# Patient Record
Sex: Female | Born: 1990 | Race: White | Hispanic: No | Marital: Married | Smoking: Never smoker
Health system: Southern US, Community
[De-identification: ages and names within clinical notes are randomized; demographics above are authoritative.]

## PROBLEM LIST (undated history)

## (undated) DIAGNOSIS — N390 Urinary tract infection, site not specified: Secondary | ICD-10-CM

## (undated) DIAGNOSIS — T7840XA Allergy, unspecified, initial encounter: Secondary | ICD-10-CM

## (undated) DIAGNOSIS — J45909 Unspecified asthma, uncomplicated: Secondary | ICD-10-CM

## (undated) DIAGNOSIS — F329 Major depressive disorder, single episode, unspecified: Secondary | ICD-10-CM

## (undated) DIAGNOSIS — K219 Gastro-esophageal reflux disease without esophagitis: Secondary | ICD-10-CM

## (undated) DIAGNOSIS — F419 Anxiety disorder, unspecified: Secondary | ICD-10-CM

## (undated) DIAGNOSIS — F32A Depression, unspecified: Secondary | ICD-10-CM

## (undated) HISTORY — DX: Allergy, unspecified, initial encounter: T78.40XA

## (undated) HISTORY — DX: Urinary tract infection, site not specified: N39.0

## (undated) HISTORY — PX: TONSILLECTOMY: SUR1361

## (undated) HISTORY — DX: Gastro-esophageal reflux disease without esophagitis: K21.9

---
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
## 2011-01-07 ENCOUNTER — Emergency Department: Payer: Self-pay | Admitting: Emergency Medicine

## 2011-07-12 ENCOUNTER — Emergency Department: Payer: Self-pay | Admitting: Emergency Medicine

## 2011-10-29 ENCOUNTER — Emergency Department: Payer: Self-pay | Admitting: Emergency Medicine

## 2011-10-29 LAB — CBC
HCT: 43.8 % (ref 35.0–47.0)
HGB: 15.1 g/dL (ref 12.0–16.0)
MCH: 29.6 pg (ref 26.0–34.0)
MCHC: 34.4 g/dL (ref 32.0–36.0)
MCV: 86 fL (ref 80–100)
Platelet: 186 10*3/uL (ref 150–440)
RBC: 5.09 10*6/uL (ref 3.80–5.20)

## 2011-10-29 LAB — COMPREHENSIVE METABOLIC PANEL
Anion Gap: 10 (ref 7–16)
BUN: 12 mg/dL (ref 7–18)
Bilirubin,Total: 0.3 mg/dL (ref 0.2–1.0)
Creatinine: 0.61 mg/dL (ref 0.60–1.30)
EGFR (African American): >60
EGFR (Non-African Amer.): >60
Glucose: 98 mg/dL (ref 65–99)
Osmolality: 285 (ref 275–301)
Potassium: 3.5 mmol/L (ref 3.5–5.1)
SGOT(AST): 22 U/L (ref 0–26)
SGPT (ALT): 19 U/L (ref 12–78)
Sodium: 143 mmol/L (ref 136–145)
Total Protein: 7.6 g/dL (ref 6.4–8.6)

## 2011-10-29 LAB — URINALYSIS, COMPLETE
Bacteria: NONE SEEN
Bilirubin,UR: NEGATIVE
Glucose,UR: NEGATIVE mg/dL (ref 0–75)
Leukocyte Esterase: NEGATIVE
RBC,UR: <1 /HPF (ref 0–5)
Squamous Epithelial: 5
WBC UR: 2 /HPF (ref 0–5)

## 2011-10-29 LAB — LIPASE, BLOOD: Lipase: 114 U/L (ref 73–393)

## 2013-08-16 ENCOUNTER — Encounter: Payer: Self-pay | Admitting: Nurse Practitioner

## 2013-08-28 ENCOUNTER — Encounter: Payer: Self-pay | Admitting: Nurse Practitioner

## 2013-11-22 LAB — OB RESULTS CONSOLE GC/CHLAMYDIA
Chlamydia: NEGATIVE
Gonorrhea: NEGATIVE

## 2013-12-14 LAB — OB RESULTS CONSOLE HEPATITIS B SURFACE ANTIGEN: HEP B S AG: NEGATIVE

## 2013-12-14 LAB — OB RESULTS CONSOLE RPR: RPR: NONREACTIVE

## 2013-12-14 LAB — OB RESULTS CONSOLE VARICELLA ZOSTER ANTIBODY, IGG: Varicella: NON-IMMUNE/NOT IMMUNE

## 2013-12-14 LAB — OB RESULTS CONSOLE ANTIBODY SCREEN: ANTIBODY SCREEN: NEGATIVE

## 2013-12-14 LAB — OB RESULTS CONSOLE ABO/RH: RH TYPE: POSITIVE

## 2013-12-14 LAB — OB RESULTS CONSOLE HIV ANTIBODY (ROUTINE TESTING): HIV: NONREACTIVE

## 2013-12-14 LAB — OB RESULTS CONSOLE RUBELLA ANTIBODY, IGM: Rubella: IMMUNE

## 2014-01-28 NOTE — L&D Delivery Note (Signed)
Deliver Note   Date of Delivery:   07/10/2014 Primary OB:   [facility] Gestational Age/EDD: [redacted]w[redacted]d by 07/23/2014, by Last Menstrual Period  Antepartum complications:  OB History    Gravida Para Term Preterm AB TAB SAB Ectopic Multiple Living   1               Delivered By:   Malachy Mood MD  Delivery Type:   NSVD  Anesthesia:    Epidural  Intrapartum complications:  GBS:    Negative (06/03 0000) Laceration:     None Episiotomy:    none Placenta:    Spontaneous Estimated Blood Loss:   545mL Baby:    Liveborn female , APGAR (1 MIN): 8  APGAR (5 MINS): 9  APGAR (10 MINS):  , weight pending   Deliver Details   At  a female was delivered via  (Presentation:ROA   ).  APGAR:8 ,9 ; weight pending  Placenta status: delivered spontaneously and intact.  3 Vessel  Cord:  Some mild atony immediately following delivery 875mcg of cytotec placed rectally   Mom to postpartum.  Baby to Couplet care / Skin to Skin.

## 2014-07-01 LAB — OB RESULTS CONSOLE GBS: GBS: NEGATIVE

## 2014-07-01 LAB — OB RESULTS CONSOLE GC/CHLAMYDIA
CHLAMYDIA, DNA PROBE: NEGATIVE
Gonorrhea: NEGATIVE

## 2014-07-10 ENCOUNTER — Inpatient Hospital Stay
Admission: EM | Admit: 2014-07-10 | Discharge: 2014-07-12 | DRG: 775 | Disposition: A | Discharge status: Home / Self Care | Payer: BLUE CROSS/BLUE SHIELD | Attending: Obstetrics and Gynecology | Admitting: Obstetrics and Gynecology

## 2014-07-10 ENCOUNTER — Encounter: Payer: Self-pay | Admitting: *Deleted

## 2014-07-10 ENCOUNTER — Inpatient Hospital Stay: Payer: BLUE CROSS/BLUE SHIELD | Admitting: Anesthesiology

## 2014-07-10 DIAGNOSIS — F329 Major depressive disorder, single episode, unspecified: Secondary | ICD-10-CM | POA: Diagnosis present

## 2014-07-10 DIAGNOSIS — J45909 Unspecified asthma, uncomplicated: Secondary | ICD-10-CM | POA: Diagnosis present

## 2014-07-10 DIAGNOSIS — Z3A38 38 weeks gestation of pregnancy: Secondary | ICD-10-CM | POA: Diagnosis present

## 2014-07-10 DIAGNOSIS — Z79899 Other long term (current) drug therapy: Secondary | ICD-10-CM

## 2014-07-10 DIAGNOSIS — Z3490 Encounter for supervision of normal pregnancy, unspecified, unspecified trimester: Secondary | ICD-10-CM

## 2014-07-10 DIAGNOSIS — Z9889 Other specified postprocedural states: Secondary | ICD-10-CM

## 2014-07-10 HISTORY — DX: Depression, unspecified: F32.A

## 2014-07-10 HISTORY — DX: Unspecified asthma, uncomplicated: J45.909

## 2014-07-10 HISTORY — DX: Major depressive disorder, single episode, unspecified: F32.9

## 2014-07-10 LAB — CBC
HCT: 37.3 % (ref 35.0–47.0)
Hemoglobin: 12.5 g/dL (ref 12.0–16.0)
MCH: 30.1 pg (ref 26.0–34.0)
MCHC: 33.6 g/dL (ref 32.0–36.0)
MCV: 89.6 fL (ref 80.0–100.0)
Platelets: 150 10*3/uL (ref 150–440)
RBC: 4.17 MIL/uL (ref 3.80–5.20)
RDW: 12.9 % (ref 11.5–14.5)
WBC: 11 10*3/uL (ref 3.6–11.0)

## 2014-07-10 MED ORDER — SENNOSIDES-DOCUSATE SODIUM 8.6-50 MG PO TABS: 2.0000 | ORAL_TABLET | ORAL | Status: DC

## 2014-07-10 MED ORDER — OXYCODONE-ACETAMINOPHEN 5-325 MG PO TABS: 2.0000 | ORAL_TABLET | ORAL | Status: DC | PRN

## 2014-07-10 MED ORDER — AMMONIA AROMATIC IN INHA
RESPIRATORY_TRACT | Status: AC
  Filled 2014-07-10: qty 10

## 2014-07-10 MED ORDER — FENTANYL 2.5 MCG/ML W/ROPIVACAINE 0.2% IN NS 100 ML EPIDURAL INFUSION (ARMC-ANES)
EPIDURAL | Status: AC
  Administered 2014-07-10: 9 mL/h via EPIDURAL
  Filled 2014-07-10: qty 100

## 2014-07-10 MED ORDER — OXYTOCIN BOLUS FROM INFUSION
500.0000 mL | INTRAVENOUS | Status: DC
  Administered 2014-07-10: 500 mL via INTRAVENOUS

## 2014-07-10 MED ORDER — ONDANSETRON HCL 4 MG/2ML IJ SOLN: 4.0000 mg | INTRAMUSCULAR | Status: DC | PRN

## 2014-07-10 MED ORDER — BUTORPHANOL TARTRATE 1 MG/ML IJ SOLN
INTRAMUSCULAR | Status: AC
  Administered 2014-07-10: 1 mg via INTRAVENOUS
  Filled 2014-07-10: qty 1

## 2014-07-10 MED ORDER — BUTORPHANOL TARTRATE 1 MG/ML IJ SOLN
1.0000 mg | Freq: Once | INTRAMUSCULAR | Status: AC
  Administered 2014-07-10: 1 mg via INTRAVENOUS

## 2014-07-10 MED ORDER — OXYTOCIN 40 UNITS IN LACTATED RINGERS INFUSION - SIMPLE MED: 62.5000 mL/h | INTRAVENOUS | Status: DC

## 2014-07-10 MED ORDER — SERTRALINE HCL 100 MG PO TABS
100.0000 mg | ORAL_TABLET | Freq: Every day | ORAL | Status: DC
  Administered 2014-07-11 – 2014-07-12 (×2): 100 mg via ORAL
  Filled 2014-07-10 (×2): qty 1

## 2014-07-10 MED ORDER — PHENYLEPHRINE 40 MCG/ML (10ML) SYRINGE FOR IV PUSH (FOR BLOOD PRESSURE SUPPORT)
80.0000 ug | PREFILLED_SYRINGE | INTRAVENOUS | Status: DC | PRN
  Filled 2014-07-10: qty 2

## 2014-07-10 MED ORDER — CITRIC ACID-SODIUM CITRATE 334-500 MG/5ML PO SOLN: 30.0000 mL | ORAL | Status: DC | PRN

## 2014-07-10 MED ORDER — IBUPROFEN 600 MG PO TABS: 600.0000 mg | ORAL_TABLET | Freq: Four times a day (QID) | ORAL | Status: DC

## 2014-07-10 MED ORDER — BUTORPHANOL TARTRATE 1 MG/ML IJ SOLN
2.0000 mg | INTRAMUSCULAR | Status: DC | PRN
  Administered 2014-07-10: 2 mg via INTRAVENOUS

## 2014-07-10 MED ORDER — MISOPROSTOL 200 MCG PO TABS
ORAL_TABLET | ORAL | Status: AC
  Administered 2014-07-10: 20:00:00
  Filled 2014-07-10: qty 4

## 2014-07-10 MED ORDER — BENZOCAINE-MENTHOL 20-0.5 % EX AERO: 1.0000 "application " | INHALATION_SPRAY | CUTANEOUS | Status: DC | PRN

## 2014-07-10 MED ORDER — LACTATED RINGERS IV SOLN: INTRAVENOUS | Status: DC

## 2014-07-10 MED ORDER — EPHEDRINE 5 MG/ML INJ
10.0000 mg | INTRAVENOUS | Status: DC | PRN
  Filled 2014-07-10: qty 2

## 2014-07-10 MED ORDER — OXYTOCIN 40 UNITS IN LACTATED RINGERS INFUSION - SIMPLE MED
INTRAVENOUS | Status: AC
  Filled 2014-07-10: qty 1000

## 2014-07-10 MED ORDER — ACETAMINOPHEN 325 MG PO TABS: 650.0000 mg | ORAL_TABLET | ORAL | Status: DC | PRN

## 2014-07-10 MED ORDER — LANOLIN HYDROUS EX OINT: TOPICAL_OINTMENT | CUTANEOUS | Status: DC | PRN

## 2014-07-10 MED ORDER — SIMETHICONE 80 MG PO CHEW: 80.0000 mg | CHEWABLE_TABLET | ORAL | Status: DC | PRN

## 2014-07-10 MED ORDER — FENTANYL 2.5 MCG/ML W/ROPIVACAINE 0.2% IN NS 100 ML EPIDURAL INFUSION (ARMC-ANES)
9.0000 mL/h | EPIDURAL | Status: DC
  Administered 2014-07-10: 9 mL/h via EPIDURAL

## 2014-07-10 MED ORDER — DIBUCAINE 1 % RE OINT: 1.0000 "application " | TOPICAL_OINTMENT | RECTAL | Status: DC | PRN

## 2014-07-10 MED ORDER — WITCH HAZEL-GLYCERIN EX PADS: 1.0000 "application " | MEDICATED_PAD | CUTANEOUS | Status: DC | PRN

## 2014-07-10 MED ORDER — PRENATAL MULTIVITAMIN CH
1.0000 | ORAL_TABLET | Freq: Every day | ORAL | Status: DC
  Administered 2014-07-12: 1 via ORAL
  Filled 2014-07-10: qty 1

## 2014-07-10 MED ORDER — LACTATED RINGERS IV SOLN
500.0000 mL | INTRAVENOUS | Status: DC | PRN
  Administered 2014-07-10: 500 mL via INTRAVENOUS

## 2014-07-10 MED ORDER — ONDANSETRON HCL 4 MG PO TABS: 4.0000 mg | ORAL_TABLET | ORAL | Status: DC | PRN

## 2014-07-10 MED ORDER — DIPHENHYDRAMINE HCL 25 MG PO CAPS: 25.0000 mg | ORAL_CAPSULE | Freq: Four times a day (QID) | ORAL | Status: DC | PRN

## 2014-07-10 MED ORDER — LIDOCAINE HCL (PF) 1 % IJ SOLN
30.0000 mL | INTRAMUSCULAR | Status: DC | PRN
  Filled 2014-07-10: qty 30

## 2014-07-10 MED ORDER — DIPHENHYDRAMINE HCL 50 MG/ML IJ SOLN: 12.5000 mg | INTRAMUSCULAR | Status: DC | PRN

## 2014-07-10 MED ORDER — BUTORPHANOL TARTRATE 1 MG/ML IJ SOLN
INTRAMUSCULAR | Status: AC
  Administered 2014-07-10: 2 mg via INTRAVENOUS
  Filled 2014-07-10: qty 2

## 2014-07-10 MED ORDER — OXYCODONE-ACETAMINOPHEN 5-325 MG PO TABS: 1.0000 | ORAL_TABLET | ORAL | Status: DC | PRN

## 2014-07-10 MED ORDER — OXYTOCIN 10 UNIT/ML IJ SOLN
INTRAMUSCULAR | Status: AC
  Filled 2014-07-10: qty 2

## 2014-07-10 MED ORDER — LIDOCAINE HCL (PF) 1 % IJ SOLN
INTRAMUSCULAR | Status: AC
  Filled 2014-07-10: qty 30

## 2014-07-10 MED ORDER — ONDANSETRON HCL 4 MG/2ML IJ SOLN: 4.0000 mg | Freq: Four times a day (QID) | INTRAMUSCULAR | Status: DC | PRN

## 2014-07-10 NOTE — Anesthesia Procedure Notes (Signed)
Epidural Patient location during procedure: OB  Staffing Anesthesiologist: Gunnar Bulla Performed by: anesthesiologist   Preanesthetic Checklist Completed: patient identified, site marked, surgical consent, pre-op evaluation, timeout performed, IV checked, risks and benefits discussed and monitors and equipment checked  Epidural Patient position: sitting Prep: Betadine Patient monitoring: heart rate, continuous pulse ox and blood pressure Approach: midline Location: L4-L5 Injection technique: LOR saline  Needle:  Needle type: Tuohy  Needle gauge: 18 G Needle length: 9 cm and 9 Catheter type: closed end flexible Catheter size: 20 Guage Test dose: negative and 1.5% lidocaine with Epi 1:200 K  Assessment Sensory level: T10 Events: blood not aspirated, injection not painful, no injection resistance, negative IV test and no paresthesia  Additional Notes   Patient tolerated the insertion well without complications. Catheter placed 1431.  Test dose 1433. 60ml 0.25 marcaine at 1437. Infusion at 1442.Reason for block:procedure for pain

## 2014-07-10 NOTE — Plan of Care (Signed)
Dr. Georgianne Fick at bedside. Discussing plan of care. MD discussing that at this time he cannot do anything to help labor speed up due to [facility] and that he would prefer for pt to receive another dose of stadol when needed and not get epidural until there is change in the cervix or not. UC's have at this time spaced from earlier.  Lenore Cordia RNC

## 2014-07-10 NOTE — Discharge Summary (Signed)
VSS, pericare complete, ice applied to perineum, gown changed.  Pt in wheelchair ready to transfer to M/B 338 accompanied by FOB "Gerald Stabs" and infant

## 2014-07-10 NOTE — OB Triage Note (Signed)
Contractions starting at 0130 AM, woke pt. up. No leaking fluid or vaginal bleeding.

## 2014-07-10 NOTE — H&P (Signed)
Obstetric H&P   Chief Complaint: Contractions  Prenatal Care Provider: [facility]  History of Present Illness: 24 y.o. G1P0 [redacted]w[redacted]d by 07/23/2014, by Last Menstrual Period presenting to L&D with contractions starting at 01:30 this AM and awakening from sleep.  +FM, no LOF, no VB  ABO, Rh: A/Positive/-- (11/17 0000)  Antibody: Negative (11/17 0000)  Rubella :Immune Varicella: Non-Immune RPR: Nonreactive (11/17 0000)  HBsAg: Negative (11/17 0000)  HIV: Non-reactive (11/17 0000)  RPR: Nonreactive (11/17 0000) 1-hr: 115 GBS: Negative (06/03 0000)  T-dap: received 4/15  Review of Systems: 10 point review of systems negative unless otherwise noted in HPI  Past Medical History: Past Medical History  Diagnosis Date  . Asthma   . Depression     Past Surgical History: Past Surgical History  Procedure Laterality Date  . Tonsillectomy    . Wisdom tooth extraction Bilateral     Past Obstetric History: G1  Past Gynecologic History: Pap normal 07/23/13  Family History: History reviewed. No pertinent family history.  Social History: History   Social History  . Marital Status: Single    Spouse Name: N/A  . Number of Children: N/A  . Years of Education: N/A   Occupational History  . Not on file.   Social History Main Topics  . Smoking status: Never Smoker   . Smokeless tobacco: Never Used  . Alcohol Use: No  . Drug Use: No  . Sexual Activity: Yes   Other Topics Concern  . Not on file   Social History Narrative  . No narrative on file    Medications: Prior to Admission medications   Medication Sig Start Date End Date Taking? Authorizing Provider  Prenatal Vit-Fe Fumarate-FA (PRENATAL MULTIVITAMIN) TABS tablet Take 1 tablet by mouth daily at 12 noon.   Yes Historical Provider, MD  sertraline (ZOLOFT) 100 MG tablet Take 100 mg by mouth daily.   Yes Historical Provider, MD    Allergies: No Known Allergies  Physical Exam: Vitals: Blood pressure 133/71, pulse 64,  temperature 97.7 F (36.5 C), temperature source Oral, resp. rate 16, last menstrual period 10/16/2013.  Urine Dip Protein: N/A  FHT: 139, moderate, positive accels, no decels reactive NST/category I tracing (actually negative spontaneous contractions stress test) Toco: q2-63min  General: Painfully contracting HEENT: normocephalic anicteric Pulmonary: no increased work of breathing Cardiovascular: RRR Abdomen: Gravid,  Non-tender Leopolds: vtx 6lbs Genitourinary: Change from 1cm on Friday to 2cm this AM now tight 3/C/0 station Extremities: no edema  Labs: No results found for this or any previous visit (from the past 24 hour(s)).  Assessment: 24 y.o. G1P0 [redacted]w[redacted]d by 07/23/2014, presenting for r/o labor  Plan: 1) R/O labor - no cervical change over first 2-hrs, contractions increased to q35min and 1cm of cervical change  2) Fetus - negative contraction stress test - weight gain 17lbs this pregnancy - 1-hr 115  3) PNL - A/Positive/-- (11/17 0000) / Negative (11/17 0000) / Rubella Immune / Varicella Non-Immune  / RPR Nonreactive (11/17 0000) / HBsAg  Negative (11/17 0000) / HIV  Nonreactive (11/17 0000) / 1-hr OGTT 115 / GBS Negative (06/03 0000)  4) TDAP - 4/15  5) Disposition - admit for term labor

## 2014-07-10 NOTE — Discharge Summary (Signed)
Obstetric Discharge Summary Reason for Admission: onset of labor Prenatal Procedures: NST Intrapartum Procedures: spontaneous vaginal delivery Postpartum Procedures: varicella vaccine  Complications-Operative and Postpartum: none HEMOGLOBIN  Date Value Ref Range Status  07/11/2014 10.4* 12.0 - 16.0 g/dL Final   HGB  Date Value Ref Range Status  10/29/2011 15.1 12.0-16.0 g/dL Final   HCT  Date Value Ref Range Status  07/11/2014 30.8* 35.0 - 47.0 % Final  10/29/2011 43.8 35.0-47.0 % Final    Physical Exam:  General: alert, cooperative, appears stated age and no distress Lochia: appropriate Uterine Fundus: firm Incision: NA DVT Evaluation: No evidence of DVT seen on physical exam.  Discharge Diagnoses: Term Pregnancy-delivered  Discharge Information: Date: 07/12/2014 Activity: unrestricted Diet: routine Medications: PNV Condition: stable Instructions: refer to practice specific booklet Discharge to: home   Newborn Data: Live born female  Birth Weight:   APGAR: 8,9   Home with mother.  Jeanette Miranda 07/12/2014, 10:40 AM

## 2014-07-10 NOTE — Anesthesia Preprocedure Evaluation (Signed)
Anesthesia Evaluation  Patient identified by MRN, date of birth, ID band  Reviewed: Allergy & Precautions, NPO status , Patient's Chart, lab work & pertinent test results  Airway Mallampati: II  TM Distance: >3 FB     Dental  (+) Chipped   Pulmonary asthma ,          Cardiovascular     Neuro/Psych PSYCHIATRIC DISORDERS Depression    GI/Hepatic   Endo/Other    Renal/GU      Musculoskeletal   Abdominal   Peds  Hematology   Anesthesia Other Findings   Reproductive/Obstetrics                             Anesthesia Physical Anesthesia Plan  ASA: II  Anesthesia Plan: Epidural   Post-op Pain Management:    Induction:   Airway Management Planned: Nasal Cannula  Additional Equipment:   Intra-op Plan:   Post-operative Plan:   Informed Consent: I have reviewed the patients History and Physical, chart, labs and discussed the procedure including the risks, benefits and alternatives for the proposed anesthesia with the patient or authorized representative who has indicated his/her understanding and acceptance.     Plan Discussed with:   Anesthesia Plan Comments:         Anesthesia Quick Evaluation

## 2014-07-10 NOTE — Progress Notes (Signed)
Subjective:  Painfully contracting  Objective:   Vitals: Blood pressure 119/67, pulse 74, temperature 98.1 F (36.7 C), temperature source Oral, resp. rate 16, height 5\' 1"  (1.549 m), weight 53.071 kg (117 lb), last menstrual period 10/16/2013. General:  Abdomen: Cervical Exam:  Dilation: 4 Effacement (%): 100 Cervical Position: Posterior Station: 0 Presentation: Vertex Exam by:: Dr. Georgianne Fick  FHT:  120, moderate, positive accels, no decels Toco: q64min  Results for orders placed or performed during the hospital encounter of 07/10/14 (from the past 24 hour(s))  CBC     Status: None   Collection Time: 07/10/14  8:48 AM  Result Value Ref Range   WBC 11.0 3.6 - 11.0 K/uL   RBC 4.17 3.80 - 5.20 MIL/uL   Hemoglobin 12.5 12.0 - 16.0 g/dL   HCT 37.3 35.0 - 47.0 %   MCV 89.6 80.0 - 100.0 fL   MCH 30.1 26.0 - 34.0 pg   MCHC 33.6 32.0 - 36.0 g/dL   RDW 12.9 11.5 - 14.5 %   Platelets 150 150 - 440 K/uL    Assessment:   24 y.o. G1P0 [redacted]w[redacted]d term labor  Plan:   1) Labor - continues to make change - ok for epidural  2) Fetus - cat I tracing

## 2014-07-11 LAB — CBC
HCT: 30.8 % — ABNORMAL LOW (ref 35.0–47.0)
HEMOGLOBIN: 10.4 g/dL — AB (ref 12.0–16.0)
MCH: 30.3 pg (ref 26.0–34.0)
MCHC: 33.9 g/dL (ref 32.0–36.0)
MCV: 89.5 fL (ref 80.0–100.0)
Platelets: 134 10*3/uL — ABNORMAL LOW (ref 150–440)
RBC: 3.44 MIL/uL — AB (ref 3.80–5.20)
RDW: 12.8 % (ref 11.5–14.5)
WBC: 15.8 10*3/uL — ABNORMAL HIGH (ref 3.6–11.0)

## 2014-07-11 LAB — RPR: RPR Ser Ql: NONREACTIVE

## 2014-07-11 MED ORDER — VARICELLA VIRUS VACCINE LIVE 1350 PFU/0.5ML IJ SUSR
0.5000 mL | INTRAMUSCULAR | Status: AC | PRN
  Administered 2014-07-12: 0.5 mL via SUBCUTANEOUS
  Filled 2014-07-11: qty 0.5

## 2014-07-11 NOTE — Progress Notes (Addendum)
PPD #1 NSVD  S: Feels well. Tolerating regular diet. Breast feeding O: BP 106/70 mmHg  Pulse 87  Temp(Src) 98.7 F (37.1 C) (Oral)  Resp 20  Ht 5\' 1"  (1.549 m)  Wt 53.071 kg (117 lb)  BMI 22.12 kg/m2  SpO2 100%  LMP 10/16/2013  Breastfeeding? Unknown-is breastfeeding FF at U, deviated to left of ML/NT (needs to void) Lochia: WNL OOB to void without difficulty No calf tenderness Hct=30% A: Stable PPD#1 Probable DC in Am Needs Varicella vaccine prior to discharge A POS/RI/ TDAP 4/15  Dalia Heading, CNM

## 2014-07-11 NOTE — Anesthesia Postprocedure Evaluation (Cosign Needed)
  Anesthesia Post-op Note  Patient: Jeanette Miranda  Procedure(s) Performed: epidural    Anesthesia type:No value filed.   Patient location:338a  Post pain: Pain level controlled  Post assessment: Post-op Vital signs reviewed, Patient's Cardiovascular Status Stable, Respiratory Function Stable, Patent Airway and No signs of Nausea or vomiting  Post vital signs: Reviewed and stable  Last Vitals:  Filed Vitals:   07/11/14 0446  BP: 95/64  Pulse: 76  Temp: 37.2 C  Resp: 17    Level of consciousness: awake, alert  and patient cooperative  Complications: No apparent anesthesia complications

## 2014-07-11 NOTE — Anesthesia Post-op Follow-up Note (Signed)
  Anesthesia Pain Follow-up Note  Patient: Jeanette Miranda  Day #: 1  Date of Follow-up: 07/11/2014 Time: 7:47 AM  Last Vitals:  Filed Vitals:   07/11/14 0446  BP: 95/64  Pulse: 76  Temp: 37.2 C  Resp: 17    Level of Consciousness: alert  Pain: none   Side Effects:None  Catheter Site Exam:clean, dry, no drainage  Plan: D/C from anesthesia care  Delaney Meigs

## 2014-07-12 NOTE — Progress Notes (Signed)
D/C order from MD.  Reviewed d/c instructions and prescriptions with patient and answered any questions.  Patient d/c home with infant via wheelchair by nursing/auxillary. 

## 2014-07-12 NOTE — Discharge Instructions (Signed)
Please call your doctor or return to the ER if you experience any chest pains, shortness of breath, fever greater than 101, any heavy bleeding or large clots, and foul smelling vaginal discharge, any worsening abdominal pain & cramping that is not controlled by pain medication, or any signs of post partum depression.  No tampons, enemas, douches, or sexual intercourse for 6 weeks.  Also avoid tub baths, hot tubs, or swimming for 6 weeks.  Postpartum Care After Vaginal Delivery After you deliver your newborn (postpartum period), the usual stay in the hospital is 24-72 hours. If there were problems with your labor or delivery, or if you have other medical problems, you might be in the hospital longer.  While you are in the hospital, you will receive help and instructions on how to care for yourself and your newborn during the postpartum period.  While you are in the hospital:  Be sure to tell your nurses if you have pain or discomfort, as well as where you feel the pain and what makes the pain worse.  If you had an incision made near your vagina (episiotomy) or if you had some tearing during delivery, the nurses may put ice packs on your episiotomy or tear. The ice packs may help to reduce the pain and swelling.  If you are breastfeeding, you may feel uncomfortable contractions of your uterus for a couple of weeks. This is normal. The contractions help your uterus get back to normal size.  It is normal to have some bleeding after delivery.  For the first 1-3 days after delivery, the flow is red and the amount may be similar to a period.  It is common for the flow to start and stop.  In the first few days, you may pass some small clots. Let your nurses know if you begin to pass large clots or your flow increases.  Do not  flush blood clots down the toilet before having the nurse look at them.  During the next 3-10 days after delivery, your flow should become more watery and pink or brown-tinged  in color.  Ten to fourteen days after delivery, your flow should be a small amount of yellowish-white discharge.  The amount of your flow will decrease over the first few weeks after delivery. Your flow may stop in 6-8 weeks. Most women have had their flow stop by 12 weeks after delivery.  You should change your sanitary pads frequently.  Wash your hands thoroughly with soap and water for at least 20 seconds after changing pads, using the toilet, or before holding or feeding your newborn.  You should feel like you need to empty your bladder within the first 6-8 hours after delivery.  In case you become weak, lightheaded, or faint, call your nurse before you get out of bed for the first time and before you take a shower for the first time.  Within the first few days after delivery, your breasts may begin to feel tender and full. This is called engorgement. Breast tenderness usually goes away within 48-72 hours after engorgement occurs. You may also notice milk leaking from your breasts. If you are not breastfeeding, do not stimulate your breasts. Breast stimulation can make your breasts produce more milk.  Spending as much time as possible with your newborn is very important. During this time, you and your newborn can feel close and get to know each other. Having your newborn stay in your room (rooming in) will help to strengthen the bond  with your newborn. It will give you time to get to know your newborn and become comfortable caring for your newborn.  Your hormones change after delivery. Sometimes the hormone changes can temporarily cause you to feel sad or tearful. These feelings should not last more than a few days. If these feelings last longer than that, you should talk to your caregiver.  If desired, talk to your caregiver about methods of family planning or contraception.  Talk to your caregiver about immunizations. Your caregiver may want you to have the following immunizations before  leaving the hospital:  Tetanus, diphtheria, and pertussis (Tdap) or tetanus and diphtheria (Td) immunization. It is very important that you and your family (including grandparents) or others caring for your newborn are up-to-date with the Tdap or Td immunizations. The Tdap or Td immunization can help protect your newborn from getting ill.  Rubella immunization.  Varicella (chickenpox) immunization.  Influenza immunization. You should receive this annual immunization if you did not receive the immunization during your pregnancy. Document Released: 11/11/2006 Document Revised: 10/09/2011 Document Reviewed: 09/11/2011 [facility] Patient Information 2015 [place], [place]. This information is not intended to replace advice given to you by your health care provider. Make sure you discuss any questions you have with your health care provider.

## 2015-05-03 ENCOUNTER — Encounter: Payer: Self-pay | Admitting: Gynecology

## 2015-05-03 ENCOUNTER — Ambulatory Visit
Admission: EM | Admit: 2015-05-03 | Discharge: 2015-05-03 | Disposition: A | Discharge status: Home / Self Care | Payer: Medicaid Other | Attending: Family Medicine | Admitting: Family Medicine

## 2015-05-03 DIAGNOSIS — R21 Rash and other nonspecific skin eruption: Secondary | ICD-10-CM | POA: Diagnosis not present

## 2015-05-03 MED ORDER — LORATADINE 10 MG PO TABS: 10.0000 mg | ORAL_TABLET | Freq: Every day | ORAL | Status: DC

## 2015-05-03 MED ORDER — PREDNISONE 20 MG PO TABS: 40.0000 mg | ORAL_TABLET | Freq: Every day | ORAL | Status: DC

## 2015-05-03 MED ORDER — RANITIDINE HCL 150 MG PO TABS
150.0000 mg | ORAL_TABLET | Freq: Two times a day (BID) | ORAL | Status: DC
Start: 2015-05-03 — End: 2016-04-27

## 2015-05-03 MED ORDER — RANITIDINE HCL 150 MG PO TABS: 150.0000 mg | ORAL_TABLET | Freq: Two times a day (BID) | ORAL | Status: DC

## 2015-05-03 MED ORDER — LORATADINE 10 MG PO TABS
10.0000 mg | ORAL_TABLET | Freq: Every day | ORAL | Status: DC
Start: 2015-05-03 — End: 2016-04-27

## 2015-05-03 NOTE — ED Provider Notes (Signed)
[facility]  ____________________________________________  Time seen: Approximately 6:37 PM  I have reviewed the triage vital signs and the nursing notes.   HISTORY  Chief Complaint Rash   HPI Jeanette Miranda is a 25 y.o. female presents for complaint of rash to right side of neck and right face. Patient reports that she was fine last night before she went to bed. Patient states that during the night she returned waking up to scratch her right side of her neck. Patient states initially she thought that some of her hair was tickling her neck causing it to itch. Patient states that when she got up the right side of her neck had a rash that was itching and states that the rash then spread to her right side of her face this morning. Denies any other rash. Reports has not taken anything for rash.   Denies recent changes in medications, lotions, detergents or other known contacts. Denies others in household with same. Denies history of similar. Denies swelling. Denies any lip, tongue, mouth or facial swelling. Denies any difficulty eating or swallowing. Denies shortness of breath or wheezing. Reports has continued to eat and drink well throughout the day.  Denies any insect bites, warmth to the skin, drainage. Denies tick bite or tick exposure.  Denies chest pain, shortness breath, dizziness, weakness, neck pain, back pain or swelling.  Patient reports that she currently is occasionally breast-feeding. Patient states that she has shared custody of her 38 month old and intermittently breast-feeds.  No LMP recorded. Patient is not currently having periods (Reason: IUD). Denies chance of pregnancy. States not pregnant.    PCP Princella Ion     Past Medical History  Diagnosis Date  . Asthma   . Depression     Patient Active Problem List   Diagnosis Date Noted  . Postpartum care following vaginal delivery 07/12/2014  . Pregnancy 07/10/2014  . Labor and delivery, indication for  care 07/10/2014    Past Surgical History  Procedure Laterality Date  . Tonsillectomy    . Wisdom tooth extraction Bilateral     Current Outpatient Rx  Name  Route  Sig  Dispense  Refill  . Prenatal Vit-Fe Fumarate-FA (PRENATAL MULTIVITAMIN) TABS tablet   Oral   Take 1 tablet by mouth daily.          . sertraline (ZOLOFT) 100 MG tablet   Oral   Take 100 mg by mouth daily.           Allergies Review of patient's allergies indicates no known allergies.  No family history on file.  Social History Social History  Substance Use Topics  . Smoking status: Never Smoker   . Smokeless tobacco: Never Used  . Alcohol Use: No    Review of Systems Constitutional: No fever/chills Eyes: No visual changes. ENT: No sore throat. Cardiovascular: Denies chest pain. Respiratory: Denies shortness of breath. Gastrointestinal: No abdominal pain.  No nausea, no vomiting.  No diarrhea.  No constipation. Genitourinary: Negative for dysuria. Musculoskeletal: Negative for back pain. Skin: positive for rash. Neurological: Negative for headaches, focal weakness or numbness.  10-point ROS otherwise negative.  ____________________________________________   PHYSICAL EXAM:  VITAL SIGNS: ED Triage Vitals  Enc Vitals Group     BP 05/03/15 1738 110/65 mmHg     Pulse Rate 05/03/15 1738 72     Resp 05/03/15 1738 16     Temp 05/03/15 1738 98.4 F (36.9 C)     Temp Source 05/03/15 1738  Oral     SpO2 05/03/15 1738 100 %     Weight 05/03/15 1738 98 lb (44.453 kg)     Height 05/03/15 1738 5\' 1"  (1.549 m)     Head Cir --      Peak Flow --      Pain Score 05/03/15 1743 0     Pain Loc --      Pain Edu? --      Excl. in [place]? --     Constitutional: Alert and oriented. Well appearing and in no acute distress. Eyes: Conjunctivae are normal. PERRL. EOMI. Head: Atraumatic.  Ears: no erythema, normal TMs bilaterally.   Nose: No congestion/rhinnorhea.  Mouth/Throat: Mucous membranes are moist.   Oropharynx non-erythematous. Neck: No stridor.  No cervical spine tenderness to palpation.Nontender. No swelling. Hematological/Lymphatic/Immunilogical: No cervical lymphadenopathy. Cardiovascular: Normal rate, regular rhythm. Grossly normal heart sounds.  Good peripheral circulation. Respiratory: Normal respiratory effort.  No retractions. Lungs CTAB.No wheezes, rales or rhonchi. Good air movement. Gastrointestinal: Soft and nontender.  No CVA tenderness. Musculoskeletal: No lower or upper extremity tenderness nor edema.  No joint effusions. Bilateral pedal pulses equal and easily palpated.  Neurologic:  Normal speech and language. No gross focal neurologic deficits are appreciated. No gait instability. Skin:  Skin is warm, dry and intact. No rash noted. Except: Right lateral anterior neck and right lower face with pruritic scattered maculopapular rash present, patient actively scratching area,  no other erythema, no induration, no fluctuance, no drainage, nontender. No other rash noted.  Psychiatric: Mood and affect are normal. Speech and behavior are normal.  ____________________________________________   LABS (all labs ordered are listed, but only abnormal results are displayed)  Labs Reviewed - No data to display   INITIAL IMPRESSION / ASSESSMENT AND PLAN / ED COURSE  Pertinent labs & imaging results that were available during my care of the patient were reviewed by me and considered in my medical decision making (see chart for details).  very well-appearing patient. No acute distress. Presents with complaints of rash to right neck and right face that occurred overnight after laying on her pillow. Denies other complaints. Denies known trigger. Well appearing. Lungs clear throughout. Abdomen soft and nontender. Rash pattern and clinical presentation suspicious for contact dermatitis. Encouraged patient to monitor for triggers. Will treat with oral prednisone 5 days, Claritin and Zantac.  Encouraged to avoid scratching. Discussed in detail with patient as patient is intermittently breast-feeding recommend to not breast-feed while taking these medications, recommended to pump and discard milk. Patient states that she has no problem in doing this and states that that is because she does not breast-feed solely at this time. Recommend follow-up with primary care physician this week.  Discussed follow up with Primary care physician this week. Discussed follow up to urgent care or ER and return parameters including  increased rash, facial swelling, shortness breath, difficulty swallowing, wheezing, no resolution or any worsening concerns. Patient verbalized understanding and agreed to plan.   ____________________________________________   FINAL CLINICAL IMPRESSION(S) / ED DIAGNOSES  Final diagnoses:  Rash      Note: This dictation was prepared with Dragon dictation along with smaller phrase technology. Any transcriptional errors that result from this process are unintentional.    Marylene Land, NP 05/03/15 2149  Marylene Land, NP 05/03/15 2150

## 2015-05-03 NOTE — Discharge Instructions (Signed)
Take medication as prescribed. Rest. Drink plenty of fluids. Do not breastfeed while taking medications. Pump and discard milk as discussed.   Follow up with your primary care physician this week. Return to Urgent care or proceed to ER for increased rash, swelling, mouth or oral swelling, difficulty swallowing, increased redness, new or worsening concerns.

## 2015-05-03 NOTE — ED Notes (Signed)
Patient c/o itchy rash on neck and face x yesterday.

## 2015-06-27 ENCOUNTER — Encounter: Payer: Self-pay | Admitting: Gynecology

## 2015-06-27 ENCOUNTER — Ambulatory Visit: Payer: Medicaid Other

## 2015-06-27 ENCOUNTER — Ambulatory Visit
Admission: EM | Admit: 2015-06-27 | Discharge: 2015-06-27 | Disposition: A | Discharge status: Home / Self Care | Payer: Medicaid Other | Attending: Family Medicine | Admitting: Family Medicine

## 2015-06-27 DIAGNOSIS — M79645 Pain in left finger(s): Secondary | ICD-10-CM | POA: Diagnosis present

## 2015-06-27 DIAGNOSIS — J45909 Unspecified asthma, uncomplicated: Secondary | ICD-10-CM | POA: Insufficient documentation

## 2015-06-27 DIAGNOSIS — M65312 Trigger thumb, left thumb: Secondary | ICD-10-CM | POA: Diagnosis not present

## 2015-06-27 DIAGNOSIS — F329 Major depressive disorder, single episode, unspecified: Secondary | ICD-10-CM | POA: Diagnosis not present

## 2015-06-27 MED ORDER — MELOXICAM 15 MG PO TABS: 15.0000 mg | ORAL_TABLET | Freq: Every day | ORAL | Status: DC

## 2015-06-27 NOTE — ED Provider Notes (Signed)
CSN: [id number]     Arrival date & time 06/27/15  1559 History   First MD Initiated Contact with Patient 06/27/15 [place]    Nurses notes were reviewed. Chief Complaint  Patient presents with  . Finger Injury   Patient injured her left thumb at work about a week ago when she left thumb with a hammer. She still didn't report to work people at the time she says she is not planning to [employer]. 4. Since then has been painful lately which has been the thumb she feels that it is clicking or catching. No previous injuries to the thumb. She has a history of asthma and depression she does not smoke. Set tonsillectomy teeth extraction. She is gravida 1 para 1 and no pertinent family medical history pertaining to this visit.      (Consider location/radiation/quality/duration/timing/severity/associated sxs/prior Treatment) Patient is a 25 y.o. female presenting with extremity pain. The history is provided by the patient. No language interpreter was used.  Extremity Pain This is a new problem. The current episode started more than 1 week ago. The problem occurs constantly. The problem has been gradually worsening. Pertinent negatives include no chest pain, no abdominal pain, no headaches and no shortness of breath. Exacerbated by: Movement of the left thumb. She has tried nothing for the symptoms.    Past Medical History  Diagnosis Date  . Asthma   . Depression    Past Surgical History  Procedure Laterality Date  . Tonsillectomy    . Wisdom tooth extraction Bilateral    No family history on file. Social History  Substance Use Topics  . Smoking status: Never Smoker   . Smokeless tobacco: Never Used  . Alcohol Use: No   OB History    Gravida Para Term Preterm AB TAB SAB Ectopic Multiple Living   1 1 1       0 1     Review of Systems  Respiratory: Negative for shortness of breath.   Cardiovascular: Negative for chest pain.  Gastrointestinal: Negative for abdominal pain.    Musculoskeletal: Positive for myalgias.       L thumb  Neurological: Negative for headaches.  All other systems reviewed and are negative.   Allergies  Review of patient's allergies indicates no known allergies.  Home Medications   Prior to Admission medications   Medication Sig Start Date End Date Taking? Authorizing Provider  levonorgestrel (MIRENA) 20 MCG/24HR IUD 1 each by Intrauterine route once.   Yes Historical Provider, MD  sertraline (ZOLOFT) 100 MG tablet Take 100 mg by mouth daily.   Yes Historical Provider, MD  loratadine (CLARITIN) 10 MG tablet Take 1 tablet (10 mg total) by mouth daily. 05/03/15   Marylene Land, NP  meloxicam (MOBIC) 15 MG tablet Take 1 tablet (15 mg total) by mouth daily. 06/27/15   Frederich Cha, MD  predniSONE (DELTASONE) 20 MG tablet Take 2 tablets (40 mg total) by mouth daily. 05/03/15   Marylene Land, NP  Prenatal Vit-Fe Fumarate-FA (PRENATAL MULTIVITAMIN) TABS tablet Take 1 tablet by mouth daily.     Historical Provider, MD  ranitidine (ZANTAC) 150 MG tablet Take 1 tablet (150 mg total) by mouth 2 (two) times daily. 05/03/15 05/08/15  Marylene Land, NP   Meds Ordered and Administered this Visit  Medications - No data to display  BP 106/60 mmHg  Pulse 66  Temp(Src) 98.2 F (36.8 C) (Oral)  Resp 16  Ht 5\' 1"  (1.549 m)  Wt 101 lb (45.813 kg)  BMI 19.09 kg/m2  SpO2 100% No data found.   Physical Exam  Constitutional: She is oriented to person, place, and time. She appears well-developed and well-nourished. No distress.  HENT:  Head: Normocephalic and atraumatic.  Eyes: Pupils are equal, round, and reactive to light.  Neck: Normal range of motion.  Pulmonary/Chest: Effort normal.  Musculoskeletal: She exhibits tenderness.       Left hand: She exhibits decreased range of motion and tenderness. She exhibits no deformity.       Hands: Tenderness over the left thumb especially over the DIP joint  Neurological: She is alert and oriented to person,  place, and time.  Skin: Skin is warm.  Psychiatric: She has a normal mood and affect.  Vitals reviewed.   ED Course  Procedures (including critical care time)  Labs Review Labs Reviewed - No data to display  Imaging Review Dg Finger Thumb Left  06/27/2015  CLINICAL DATA:  Hit thumb with a hammer 1 week ago with pain EXAM: LEFT THUMB 2+V COMPARISON:  None. FINDINGS: No acute fracture is seen. Alignment is normal. Joint spaces appear normal. IMPRESSION: Negative. Electronically Signed   By: Ivar Drape M.D.   On: 06/27/2015 17:11     Visual Acuity Review  Right Eye Distance:   Left Eye Distance:   Bilateral Distance:    Right Eye Near:   Left Eye Near:    Bilateral Near:         MDM   1. Trigger thumb, left    Split the left thumb recommend orthopedic hand surgeon referral to see in 1-2 weeks especially if not improving. We'll place on Mobic 15 mg 1 tablet day I will see if this splinting and Mobic helps she can cancel the orthopedic referral. Reviewing of work today and tomorrow.   Note: This dictation was prepared with Dragon dictation along with smaller phrase technology. Any transcriptional errors that result from this process are unintentional.       Frederich Cha, MD 06/27/15 [phone]

## 2015-06-27 NOTE — ED Notes (Signed)
Patient c/o x 1 week ago hit left thumb with a hammer. Patient stated x 3-4 days left thumb is painful with motion.

## 2015-06-27 NOTE — Discharge Instructions (Signed)
Trigger Finger °Trigger finger (digital tendinitis and stenosing tenosynovitis) is a common disorder that causes an often painful catching of the fingers or thumb. It occurs as a clicking, snapping, or locking of a finger in the palm of the hand. This is caused by a problem with the tendons that flex or bend the fingers sliding smoothly through their sheaths. The condition may occur in any finger or a couple fingers at the same time.  °The finger may lock with the finger curled or suddenly straighten out with a snap. This is more common in patients with rheumatoid arthritis and diabetes. Left untreated, the condition may get worse to the point where the finger becomes locked in flexion, like making a fist, or less commonly locked with the finger straightened out. °CAUSES  °· Inflammation and scarring that lead to swelling around the tendon sheath. °· Repeated or forceful movements. °· Rheumatoid arthritis, an autoimmune disease that affects joints. °· Gout. °· Diabetes mellitus. °SIGNS AND SYMPTOMS °· Soreness and swelling of your finger. °· A painful clicking or snapping as you bend and straighten your finger. °DIAGNOSIS  °Your health care provider will do a physical exam of your finger to diagnose trigger finger. °TREATMENT  °· Splinting for 6-8 weeks may be helpful. °· Nonsteroidal anti-inflammatory medicines (NSAIDs) can help to relieve the pain and inflammation. °· Cortisone injections, along with splinting, may speed up recovery. Several injections may be required. Cortisone may give relief after one injection. °· Surgery is another treatment that may be used if conservative treatments do not work. Surgery can be minor, without incisions (a cut does not have to be made), and can be done with a needle through the skin. °· Other surgical choices involve an open procedure in which the surgeon opens the hand through a small incision and cuts the pulley so the tendon can again slide smoothly. Your hand will still  work fine. °HOME CARE INSTRUCTIONS °· Apply ice to the injured area, twice per day: °¨ Put ice in a plastic bag. °¨ Place a towel between your skin and the bag. °¨ Leave the ice on for 20 minutes, 3-4 times a day. °· Rest your hand often. °MAKE SURE YOU:  °· Understand these instructions. °· Will watch your condition. °· Will get help right away if you are not doing well or get worse. °  °This information is not intended to replace advice given to you by your health care provider. Make sure you discuss any questions you have with your health care provider. °  °Document Released: 11/04/2003 Document Revised: 09/16/2012 Document Reviewed: 06/16/2012 °Elsevier Interactive Patient Education ©2016 [employer] ° °

## 2015-09-01 ENCOUNTER — Emergency Department
Admission: EM | Admit: 2015-09-01 | Discharge: 2015-09-01 | Disposition: A | Discharge status: Home / Self Care | Payer: Medicaid Other | Attending: Emergency Medicine | Admitting: Emergency Medicine

## 2015-09-01 DIAGNOSIS — Z79899 Other long term (current) drug therapy: Secondary | ICD-10-CM | POA: Insufficient documentation

## 2015-09-01 DIAGNOSIS — Z760 Encounter for issue of repeat prescription: Secondary | ICD-10-CM | POA: Insufficient documentation

## 2015-09-01 DIAGNOSIS — J45909 Unspecified asthma, uncomplicated: Secondary | ICD-10-CM | POA: Diagnosis not present

## 2015-09-01 MED ORDER — SERTRALINE HCL 100 MG PO TABS
100.0000 mg | ORAL_TABLET | Freq: Every day | ORAL | 0 refills | Status: DC
Start: 2015-09-01 — End: 2016-04-27

## 2015-09-01 NOTE — ED Provider Notes (Signed)
[facility] Emergency Department Provider Note   ____________________________________________   First MD Initiated Contact with Patient 09/01/15 1242     (approximate)  I have reviewed the triage vital signs and the nursing notes.   HISTORY  Chief Complaint Medication Refill    HPI Jeanette Miranda is a 25 y.o. female is here for refill of her Zoloft 100 mg. Patient states she took her last dose today and is completely out. She states that Dr. Shelda Pal is out of the office and no one in the same office will authorize a refill until that the doctor returns from vacation. Patient states that she only needs enough until her doctor returns to the office on Wednesday. She denies any problems at this time. She denies any pain.   Past Medical History:  Diagnosis Date  . Asthma   . Depression     Patient Active Problem List   Diagnosis Date Noted  . Postpartum care following vaginal delivery 07/12/2014  . Pregnancy 07/10/2014  . Labor and delivery, indication for care 07/10/2014    Past Surgical History:  Procedure Laterality Date  . TONSILLECTOMY    . WISDOM TOOTH EXTRACTION Bilateral     Prior to Admission medications   Medication Sig Start Date End Date Taking? Authorizing Provider  levonorgestrel (MIRENA) 20 MCG/24HR IUD 1 each by Intrauterine route once.    Historical Provider, MD  loratadine (CLARITIN) 10 MG tablet Take 1 tablet (10 mg total) by mouth daily. 05/03/15   Marylene Land, NP  meloxicam (MOBIC) 15 MG tablet Take 1 tablet (15 mg total) by mouth daily. 06/27/15   Frederich Cha, MD  predniSONE (DELTASONE) 20 MG tablet Take 2 tablets (40 mg total) by mouth daily. 05/03/15   Marylene Land, NP  Prenatal Vit-Fe Fumarate-FA (PRENATAL MULTIVITAMIN) TABS tablet Take 1 tablet by mouth daily.     Historical Provider, MD  ranitidine (ZANTAC) 150 MG tablet Take 1 tablet (150 mg total) by mouth 2 (two) times daily. 05/03/15 05/08/15  Marylene Land, NP    sertraline (ZOLOFT) 100 MG tablet Take 1 tablet (100 mg total) by mouth daily. 09/01/15 08/31/16  Johnn Hai, PA-C    Allergies Review of patient's allergies indicates no known allergies.  No family history on file.  Social History Social History  Substance Use Topics  . Smoking status: Never Smoker  . Smokeless tobacco: Never Used  . Alcohol use No    Review of Systems Constitutional: No fever/chills Eyes: No visual changes. Cardiovascular: Denies chest pain. Respiratory: Denies shortness of breath. Gastrointestinal: No abdominal pain.  No nausea, no vomiting.   Skin: Negative for rash. Neurological: Negative for headaches  10-point ROS otherwise negative.  ____________________________________________   PHYSICAL EXAM:  VITAL SIGNS: ED Triage Vitals 09/01/15 1233  Enc Vitals Group     BP 114/66     Pulse Rate 81     Resp 18     Temp 98 F (36.7 C)     Temp Source Oral     SpO2 100 %     Weight 100 lb (45.4 kg)     Height      Head Circumference      Peak Flow      Pain Score      Pain Loc      Pain Edu?      Excl. in [place]?     Constitutional: Alert and oriented. Well appearing and in no acute distress. Eyes: Conjunctivae are normal.  PERRL. EOMI. Head: Atraumatic. Nose: No congestion/rhinnorhea. Neck: No stridor.   Cardiovascular: Normal rate, regular rhythm. Grossly normal heart sounds.  Good peripheral circulation. Respiratory: Normal respiratory effort.  No retractions. Lungs CTAB. Musculoskeletal: Moves upper and lower extremities without any difficulty and normal gait was noted. Neurologic:  Normal speech and language. No gross focal neurologic deficits are appreciated. No gait instability. Skin:  Skin is warm, dry and intact.  Psychiatric: Mood and affect are normal. Speech and behavior are normal.  ____________________________________________   LABS (all labs ordered are listed, but only abnormal results are displayed)  Labs Reviewed - No  data to display  PROCEDURES  Procedure(s) performed: None  Procedures  Critical Care performed: No  ____________________________________________   INITIAL IMPRESSION / ASSESSMENT AND PLAN / ED COURSE  Pertinent labs & imaging results that were available during my care of the patient were reviewed by me and considered in my medical decision making (see chart for details).    Clinical Course   Patient is given a prescription for Zoloft 500 mg one daily #6. This will cover her until her PCP is back in the office. There was documented evidence that patient is on this medication and was prescribed by her PCP and June 2016.  ____________________________________________   FINAL CLINICAL IMPRESSION(S) / ED DIAGNOSES  Final diagnoses:  Medication refill      NEW MEDICATIONS STARTED DURING THIS VISIT:  Discharge Medication List as of 09/01/2015  1:11 PM       Note:  This document was prepared using Dragon voice recognition software and may include unintentional dictation errors.    Johnn Hai, PA-C 09/01/15 [place], MD 09/01/15 [phone]

## 2015-09-01 NOTE — ED Triage Notes (Signed)
Pt states she needs a refill for her Zoloft 100mg , states she took her last dose today.. States Dr. Shelda Pal normally fills it but she and the pharmacy having attempted to contact the office.Marland Kitchen

## 2015-09-01 NOTE — ED Notes (Signed)
Patient comes in for a refill for sertraline.  Patient attempted to call PCP in regards to refill but the MD is out of the office until Wednesday and she has no refills on her bottle Rx.

## 2016-04-27 ENCOUNTER — Encounter: Payer: Self-pay | Admitting: Medical Oncology

## 2016-04-27 ENCOUNTER — Emergency Department
Admission: EM | Admit: 2016-04-27 | Discharge: 2016-04-27 | Disposition: A | Discharge status: Home / Self Care | Payer: Medicaid Other | Attending: Emergency Medicine | Admitting: Emergency Medicine

## 2016-04-27 DIAGNOSIS — L299 Pruritus, unspecified: Secondary | ICD-10-CM | POA: Diagnosis present

## 2016-04-27 DIAGNOSIS — J45909 Unspecified asthma, uncomplicated: Secondary | ICD-10-CM | POA: Diagnosis not present

## 2016-04-27 MED ORDER — HYDROXYZINE HCL 25 MG PO TABS: 25.0000 mg | ORAL_TABLET | Freq: Three times a day (TID) | ORAL | 0 refills | Status: DC | PRN

## 2016-04-27 MED ORDER — TRIAMCINOLONE ACETONIDE 0.5 % EX OINT: 1.0000 "application " | TOPICAL_OINTMENT | Freq: Two times a day (BID) | CUTANEOUS | 0 refills | Status: DC

## 2016-04-27 MED ORDER — HYDROXYZINE HCL 25 MG PO TABS
25.0000 mg | ORAL_TABLET | Freq: Once | ORAL | Status: AC
  Administered 2016-04-27: 25 mg via ORAL
  Filled 2016-04-27: qty 1

## 2016-04-27 NOTE — Discharge Instructions (Signed)
Follow-up with your primary care doctor. Call and make an appointment for next week. Atarax as needed for itching. Kenalog cream twice a day to hands and feet. Use a small layer and rubbed cream into the skin. Also trim nails to avoid scratching

## 2016-04-27 NOTE — ED Triage Notes (Signed)
Pt reports that she began having rash to both hands that is itchy and burning yesterday. Pt denies other areas involved and pt is in no resp distress.

## 2016-04-27 NOTE — ED Notes (Signed)
See triage note  Developed a itchy rash to both hands yesterday

## 2016-04-27 NOTE — ED Provider Notes (Signed)
[facility] Emergency Department Provider Note  ____________________________________________   First MD Initiated Contact with Patient 04/27/16 1426     (approximate)  I have reviewed the triage vital signs and the nursing notes.   HISTORY  Chief Complaint No chief complaint on file.    HPI Jeanette Miranda is a 26 y.o. female is here complaining of itching and rash to both hands and feet beginning yesterday. Patient states that it is a itchy, burning sensation. She states that she went to an urgent care today and was told that they did not know what her rash was and follow-up with her PCP. Once she got home she felt the rash was spreading more on her feet and came to the emergency room. She states that she is tried Neosporin and Aloe gel to these areas without improvement.  Patient denies any difficulty with breathing. She denies any previous rashes such as this.   Past Medical History:  Diagnosis Date  . Asthma   . Depression     Patient Active Problem List   Diagnosis Date Noted  . Postpartum care following vaginal delivery 07/12/2014  . Pregnancy 07/10/2014  . Labor and delivery, indication for care 07/10/2014    Past Surgical History:  Procedure Laterality Date  . TONSILLECTOMY    . WISDOM TOOTH EXTRACTION Bilateral     Prior to Admission medications   Medication Sig Start Date End Date Taking? Authorizing Provider  hydrOXYzine (ATARAX/VISTARIL) 25 MG tablet Take 1 tablet (25 mg total) by mouth 3 (three) times daily as needed. 04/27/16   Johnn Hai, PA-C  levonorgestrel (MIRENA) 20 MCG/24HR IUD 1 each by Intrauterine route once.    Historical Provider, MD  triamcinolone ointment (KENALOG) 0.5 % Apply 1 application topically 2 (two) times daily. 04/27/16   Johnn Hai, PA-C    Allergies Patient has no known allergies.  No family history on file.  Social History Social History  Substance Use Topics  . Smoking status: Never  Smoker  . Smokeless tobacco: Never Used  . Alcohol use No    Review of Systems Constitutional: No fever/chills Cardiovascular: Denies chest pain. Respiratory: Denies shortness of breath. Gastrointestinal:   No nausea, no vomiting.  Genitourinary: Negative for dysuria. Musculoskeletal: Negative for  joint pain. Skin: Positive for rash palms and feet.. Neurological: Negative for headaches, focal weakness or numbness.  10-point ROS otherwise negative.  ____________________________________________   PHYSICAL EXAM:  VITAL SIGNS: ED Triage Vitals [04/27/16 1406]  Enc Vitals Group     BP 110/76     Pulse Rate 71     Resp 18     Temp 98.5 F (36.9 C)     Temp Source Oral     SpO2 100 %     Weight 114 lb (51.7 kg)     Height 5\' 1"  (1.549 m)     Head Circumference      Peak Flow      Pain Score 7     Pain Loc      Pain Edu?      Excl. in [place]?     Constitutional: Alert and oriented. Well appearing and in no acute distress. Eyes: Conjunctivae are normal. PERRL. EOMI. Head: Atraumatic. Nose: No congestion/rhinnorhea. Neck: No stridor.   Cardiovascular: Normal rate, regular rhythm. Grossly normal heart sounds.  Good peripheral circulation. Respiratory: Normal respiratory effort.  No retractions. Lungs CTAB. Musculoskeletal: No lower extremity tenderness nor edema.  No joint effusions. Normal gait  was noted. Neurologic:  Normal speech and language. No gross focal neurologic deficits are appreciated.  Skin:  Skin is warm, dry and intact. There are fine papules/vesicle appearing lesions diffusely over the palms of the hands with minimal involvement bilateral soles. No erythema is noted. Skin is intact. There is no soft tissue swelling present. Psychiatric: Mood and affect are normal. Speech and behavior are normal.  ____________________________________________   LABS (all labs ordered are listed, but only abnormal results are displayed)  Labs Reviewed - No data to  display   PROCEDURES  Procedure(s) performed: None  Procedures  Critical Care performed: No  ____________________________________________   INITIAL IMPRESSION / ASSESSMENT AND PLAN / ED COURSE  Pertinent labs & imaging results that were available during my care of the patient were reviewed by me and considered in my medical decision making (see chart for details).  Patient is follow-up with her primary care doctor next week. Most likely this is to side or sits. We discussed possibly being referred to a dermatologist should she continue to have problems. In the emergency room today she was given Atarax 25 mg by mouth for itching. She is given a prescription for Kenalog cream to apply to area twice a day.  She is also encouraged to trim her nails to decrease the chances of her breaking the skin should she scratched her skin in her sleep.      ____________________________________________   FINAL CLINICAL IMPRESSION(S) / ED DIAGNOSES  Final diagnoses:  Itching      NEW MEDICATIONS STARTED DURING THIS VISIT:  New Prescriptions   HYDROXYZINE (ATARAX/VISTARIL) 25 MG TABLET    Take 1 tablet (25 mg total) by mouth 3 (three) times daily as needed.   TRIAMCINOLONE OINTMENT (KENALOG) 0.5 %    Apply 1 application topically 2 (two) times daily.     Note:  This document was prepared using Dragon voice recognition software and may include unintentional dictation errors.    Johnn Hai, PA-C 04/27/16 [place], MD 04/27/16 [phone]

## 2016-04-28 ENCOUNTER — Emergency Department
Admission: EM | Admit: 2016-04-28 | Discharge: 2016-04-28 | Disposition: A | Discharge status: Home / Self Care | Payer: Medicaid Other | Attending: Emergency Medicine | Admitting: Emergency Medicine

## 2016-04-28 DIAGNOSIS — B084 Enteroviral vesicular stomatitis with exanthem: Secondary | ICD-10-CM | POA: Diagnosis not present

## 2016-04-28 DIAGNOSIS — Z79899 Other long term (current) drug therapy: Secondary | ICD-10-CM | POA: Diagnosis not present

## 2016-04-28 DIAGNOSIS — R21 Rash and other nonspecific skin eruption: Secondary | ICD-10-CM | POA: Diagnosis present

## 2016-04-28 DIAGNOSIS — J45909 Unspecified asthma, uncomplicated: Secondary | ICD-10-CM | POA: Insufficient documentation

## 2016-04-28 MED ORDER — DIPHENHYDRAMINE HCL 50 MG/ML IJ SOLN
25.0000 mg | Freq: Once | INTRAMUSCULAR | Status: DC
  Filled 2016-04-28: qty 1

## 2016-04-28 NOTE — ED Triage Notes (Signed)
Patient reports seen earlier for allergic reaction.  Reports feels that it is worse.  Patient noted with light red flat rash noted to bilateral arms.

## 2016-04-28 NOTE — ED Notes (Signed)
Patient reports seen earlier for allergic reaction and states instructed to return if worse.  Patient reports rash has spread up her arms.  Noted red rash at antecub area, no obvious rash noted to arms.  Patient speaking in complete sentences without difficulty.  No acute respiratory distress noted.

## 2016-04-28 NOTE — ED Provider Notes (Signed)
[name] Emergency Department Provider Note  ____________________________________________  Time seen: Approximately 9:31 PM  I have reviewed the triage vital signs and the nursing notes.   HISTORY  Chief Complaint Allergic Reaction   HPI Jeanette Miranda is a 26 y.o. female a history of asthma who presents for evaluation of a rash.Patient reports that she has had a mild sore throat for the last few days. Her illness started 4 days ago with a severe sore throat and high fever. 2 days ago she developed a rash involving her palms and soles. The rash is pruritic and has spread for the last 2 days. She has been taking Benadryl. She reports that the rash is painful and the soles of her feet. Her son who is 56 years old has had similar rash at home. She denies having fever for the last 2 days. No headaches, no neck stiffness, no tick bite, no history of STDs, no chest pain, no shortness of breath, no abdominal pain, no nausea, no vomiting, no diarrhea.  Past Medical History:  Diagnosis Date  . Asthma   . Depression     Patient Active Problem List   Diagnosis Date Noted  . Postpartum care following vaginal delivery 07/12/2014  . Pregnancy 07/10/2014  . Labor and delivery, indication for care 07/10/2014    Past Surgical History:  Procedure Laterality Date  . TONSILLECTOMY    . WISDOM TOOTH EXTRACTION Bilateral     Prior to Admission medications   Medication Sig Start Date End Date Taking? Authorizing Provider  hydrOXYzine (ATARAX/VISTARIL) 25 MG tablet Take 1 tablet (25 mg total) by mouth 3 (three) times daily as needed. 04/27/16   Johnn Hai, PA-C  levonorgestrel (MIRENA) 20 MCG/24HR IUD 1 each by Intrauterine route once.    Historical Provider, MD  triamcinolone ointment (KENALOG) 0.5 % Apply 1 application topically 2 (two) times daily. 04/27/16   Johnn Hai, PA-C    Allergies Patient has no known allergies.  No family history on  file.  Social History Social History  Substance Use Topics  . Smoking status: Never Smoker  . Smokeless tobacco: Never Used  . Alcohol use No    Review of Systems  Constitutional: Negative for fever. Eyes: Negative for visual changes. ENT: + sore throat. Neck: No neck pain  Cardiovascular: Negative for chest pain. Respiratory: Negative for shortness of breath. Gastrointestinal: Negative for abdominal pain, vomiting or diarrhea. Genitourinary: Negative for dysuria. Musculoskeletal: Negative for back pain. Skin: + rash. Neurological: Negative for headaches, weakness or numbness. Psych: No SI or HI  ____________________________________________   PHYSICAL EXAM:  VITAL SIGNS: ED Triage Vitals [04/28/16 2113]  Enc Vitals Group     BP 111/64     Pulse Rate 68     Resp 18     Temp 97.6 F (36.4 C)     Temp Source Oral     SpO2 100 %     Weight      Height      Head Circumference      Peak Flow      Pain Score      Pain Loc      Pain Edu?      Excl. in [place]?     Constitutional: Alert and oriented. Well appearing and in no apparent distress. HEENT:      Head: Normocephalic and atraumatic.         Eyes: Conjunctivae are normal. Sclera is non-icteric. EOMI. PERRL  Mouth/Throat: Mucous membranes are moist.       Neck: Supple with no signs of meningismus. Herpangina lesions on the oropharynx Cardiovascular: Regular rate and rhythm. No murmurs, gallops, or rubs. 2+ symmetrical distal pulses are present in all extremities. No JVD. Respiratory: Normal respiratory effort. Lungs are clear to auscultation bilaterally. No wheezes, crackles, or rhonchi.  Gastrointestinal: Soft, non tender, and non distended with positive bowel sounds. No rebound or guarding. Genitourinary: No CVA tenderness. Musculoskeletal: Nontender with normal range of motion in all extremities. No edema, cyanosis, or erythema of extremities. Neurologic: Normal speech and language. Face is symmetric.  Moving all extremities. No gross focal neurologic deficits are appreciated. Skin: Skin is warm, dry and intact. Blanching macular erythematous rash involving the torso, extremities, soles and palms.  Psychiatric: Mood and affect are normal. Speech and behavior are normal.  ____________________________________________   LABS (all labs ordered are listed, but only abnormal results are displayed)  Labs Reviewed - No data to display ____________________________________________  EKG  none  ____________________________________________  RADIOLOGY  none  ____________________________________________   PROCEDURES  Procedure(s) performed: None Procedures Critical Care performed:  None ____________________________________________   INITIAL IMPRESSION / ASSESSMENT AND PLAN / ED COURSE   26 y.o. female a history of asthma who presents for evaluation of sore throat and a rash involving the palms and the soles. Patient is extremely well appearing, no distress, she has a macular blanching erythematous rash involving her torso, extremities, palms and soles. She also has herpangina. No meningeal signs. No history of tick bites. No history of STDs. Presentation is concerning for coxsackie hand-foot-and-mouth disease. Patient will be discharged home with supportive care.     Pertinent labs & imaging results that were available during my care of the patient were reviewed by me and considered in my medical decision making (see chart for details).    ____________________________________________   FINAL CLINICAL IMPRESSION(S) / ED DIAGNOSES  Final diagnoses:  Hand, foot and mouth disease      NEW MEDICATIONS STARTED DURING THIS VISIT:  New Prescriptions   No medications on file     Note:  This document was prepared using Dragon voice recognition software and may include unintentional dictation errors.    Rudene Re, MD 04/28/16 2136

## 2016-07-15 ENCOUNTER — Ambulatory Visit: Payer: Self-pay | Attending: Oncology

## 2016-07-15 ENCOUNTER — Encounter (INDEPENDENT_AMBULATORY_CARE_PROVIDER_SITE_OTHER): Payer: Self-pay

## 2016-07-15 VITALS — BP 98/64 | HR 75 | Temp 97.2°F | Ht 61.0 in | Wt 111.0 lb

## 2016-07-15 DIAGNOSIS — N63 Unspecified lump in unspecified breast: Secondary | ICD-10-CM

## 2016-07-15 NOTE — Progress Notes (Signed)
Subjective:     Patient ID: Jeanette Miranda, female   DOB: Aug 28, 1990, 26 y.o.   MRN: [id number]  HPI   Review of Systems     Objective:   Physical Exam  Pulmonary/Chest: Right breast exhibits mass and tenderness. Right breast exhibits no inverted nipple, no nipple discharge and no skin change. Left breast exhibits no inverted nipple, no mass, no nipple discharge, no skin change and no tenderness. Breasts are symmetrical.         Assessment:     26 year old patient presents for [facility] clinic visit.  Referred for painful right breast lump by Sabino Snipes MD.  Patient reports she has noticed for about one year.  Patient screened, and meets BCCCP eligibility.  Patient does not have insurance, Medicare or Medicaid.  Handout given on Affordable Care Act.  Instructed patient on breast self-exam using teach back method.  Palpated small nodularity 10 o'clock right breast which patient identifies as painful on palpation.  States she has noticed about 1 1/2 months.  No discrete mass palpated.    Plan:    [name] to schedule right breast ultrasound with [facility], and notify patient of appointment.

## 2016-07-22 ENCOUNTER — Other Ambulatory Visit: Payer: Self-pay

## 2016-07-25 ENCOUNTER — Ambulatory Visit
Admission: RE | Admit: 2016-07-25 | Discharge: 2016-07-25 | Disposition: A | Discharge status: Home / Self Care | Payer: Medicaid Other | Source: Ambulatory Visit | Attending: Oncology | Admitting: Oncology

## 2016-07-25 DIAGNOSIS — N63 Unspecified lump in unspecified breast: Secondary | ICD-10-CM

## 2016-07-28 NOTE — Progress Notes (Signed)
Radilologist discussed benign findings with patient.  Copy to [facility].

## 2016-10-08 ENCOUNTER — Other Ambulatory Visit: Payer: Self-pay | Admitting: Obstetrics and Gynecology

## 2016-10-09 ENCOUNTER — Other Ambulatory Visit: Payer: Self-pay | Admitting: Obstetrics and Gynecology

## 2016-10-09 ENCOUNTER — Encounter: Payer: Self-pay | Admitting: Obstetrics and Gynecology

## 2016-10-09 ENCOUNTER — Telehealth: Payer: Self-pay

## 2016-10-09 NOTE — Telephone Encounter (Signed)
Please advise 

## 2016-10-09 NOTE — Telephone Encounter (Signed)
Pt calling requesting refill on Zoloft to last her to her appt 10/23/2016. fwding to KJ ([facility] nurse)

## 2016-10-09 NOTE — Telephone Encounter (Signed)
Has been sent.

## 2016-10-10 ENCOUNTER — Other Ambulatory Visit: Payer: Self-pay | Admitting: Obstetrics and Gynecology

## 2016-10-10 ENCOUNTER — Telehealth: Payer: Self-pay

## 2016-10-10 NOTE — Telephone Encounter (Signed)
Pt aware Rx has been called in

## 2016-10-23 ENCOUNTER — Ambulatory Visit: Payer: Self-pay | Admitting: Obstetrics and Gynecology

## 2017-01-28 HISTORY — PX: BREAST BIOPSY: SHX20

## 2017-03-07 ENCOUNTER — Encounter: Payer: Self-pay | Admitting: Obstetrics & Gynecology

## 2017-03-07 ENCOUNTER — Ambulatory Visit: Payer: Medicaid Other | Admitting: Obstetrics & Gynecology

## 2017-03-07 VITALS — BP 102/70 | Ht 61.0 in | Wt 116.0 lb

## 2017-03-07 DIAGNOSIS — N6311 Unspecified lump in the right breast, upper outer quadrant: Secondary | ICD-10-CM

## 2017-03-07 DIAGNOSIS — N63 Unspecified lump in unspecified breast: Secondary | ICD-10-CM | POA: Insufficient documentation

## 2017-03-07 MED ORDER — ALBUTEROL SULFATE HFA 108 (90 BASE) MCG/ACT IN AERS: 2.0000 | INHALATION_SPRAY | RESPIRATORY_TRACT | 3 refills | Status: DC | PRN

## 2017-03-07 NOTE — Progress Notes (Signed)
HPI:      [name] is a 27 y.o. G1P1001 who is premenopausal, presents today for a problem visit.  She complains of breast lump and breast tenderness on the rightside which she first noticed last summer.  It has worsened in size and degree of pain since that time.  She had evaluation by PCP and [place] of right breast last June, with benign features identified and recommendations to observe without treatment.  Associated symptoms include no other context or associated sx's.  She breast fed her only child for first 10 mos of his life and had stopped several months before this pain and lump developed.  Denies nipple discharge or skin changes.  No fever.  Prior Mammogram: none.  Breast US last year.  07/25/16:  ULTRASOUND OF THE RIGHT BREAST COMPARISON:  None. FINDINGS: On physical exam, there is a firm ridge of tissue in the upper-outer quadrant without discrete palpable mass. Tenderness is elicited on palpation. Targeted ultrasound is performed, showing normal fibroglandular tissue at the site of focal pain in the right breast at 10 o'clock. No masses or suspicious areas of shadowing are identified. IMPRESSION: 1. No targeted sonographic abnormalities are identified to account for the patient's focal tenderness in the right breast. No targeted sonographic evidence of right breast malignancy. RECOMMENDATION: 1. Clinical follow-up recommended for the tender area of concern in the right breast. Any further workup should be based on clinical grounds.  Prior breast problems: No Family History: Breast Cancer-relatedfamily history is not on file.  PMHx: She  has a past medical history of Asthma and Depression. Also,  has a past surgical history that includes Tonsillectomy and Wisdom tooth extraction (Bilateral)., family history is not on file.,  reports that  has never smoked. she has never used smokeless tobacco. She reports that she does not drink alcohol or use drugs.  She has a  current medication list which includes the following prescription(s): albuterol, hydroxyzine, levonorgestrel, sertraline, and triamcinolone ointment. Also, has No Known Allergies.  Review of Systems  Constitutional: Negative for chills, fever and malaise/fatigue.  HENT: Negative for congestion, sinus pain and sore throat.   Eyes: Negative for blurred vision and pain.  Respiratory: Negative for cough and wheezing.   Cardiovascular: Negative for chest pain and leg swelling.  Gastrointestinal: Negative for abdominal pain, constipation, diarrhea, heartburn, nausea and vomiting.  Genitourinary: Negative for dysuria, frequency, hematuria and urgency.  Musculoskeletal: Negative for back pain, joint pain, myalgias and neck pain.  Skin: Negative for itching and rash.  Neurological: Negative for dizziness, tremors and weakness.  Endo/Heme/Allergies: Does not bruise/bleed easily.  Psychiatric/Behavioral: Negative for depression. The patient is not nervous/anxious and does not have insomnia.     Objective: BP 102/70   Ht 5\' 1"  (1.549 m)   Wt 116 lb (52.6 kg)   BMI 21.92 kg/m  Physical Exam  Constitutional: She is oriented to person, place, and time. She appears well-developed and well-nourished. No distress.  Cardiovascular: Normal rate, regular rhythm, normal heart sounds and normal pulses. Exam reveals no gallop and no friction rub.  No murmur heard. Pulmonary/Chest: Effort normal and breath sounds normal. She exhibits no mass, no tenderness and no edema. Right breast exhibits mass and tenderness. Right breast exhibits no inverted nipple, no nipple discharge and no skin change. Left breast exhibits no inverted nipple, no mass, no nipple discharge, no skin change and no tenderness. There is no breast swelling.  2 cm tender mobile R UOQ mass, not firm to  palpation No skin or nipple changes  Abdominal: Normal appearance.  Musculoskeletal: Normal range of motion.  Lymphadenopathy:    She has no  axillary adenopathy.       Right axillary: No pectoral and no lateral adenopathy present.       Left axillary: No pectoral and no lateral adenopathy present. Neurological: She is alert and oriented to person, place, and time.  Skin: Skin is warm and dry. No abrasion, no bruising, no lesion and no rash noted. No erythema.  Psychiatric: She has a normal mood and affect. Her speech is normal and behavior is normal. Judgment normal.  Vitals reviewed.   ASSESSMENT/PLAN: breast mass and mastalgia 1. Breast mass in female w mass - Ambulatory referral to General Surgery - Consider excision due to its persistence and discomfort  Barnett Applebaum, MD, [facility], [facility] 03/07/2017  2:07 PM

## 2017-03-10 ENCOUNTER — Encounter: Payer: Self-pay | Admitting: General Surgery

## 2017-04-01 ENCOUNTER — Inpatient Hospital Stay: Payer: Self-pay

## 2017-04-01 ENCOUNTER — Ambulatory Visit: Payer: Medicaid Other | Admitting: General Surgery

## 2017-04-01 ENCOUNTER — Encounter: Payer: Self-pay | Admitting: General Surgery

## 2017-04-01 VITALS — BP 116/70 | HR 82 | Resp 12 | Ht 61.0 in | Wt 116.0 lb

## 2017-04-01 DIAGNOSIS — N6459 Other signs and symptoms in breast: Secondary | ICD-10-CM

## 2017-04-01 DIAGNOSIS — N644 Mastodynia: Secondary | ICD-10-CM | POA: Diagnosis not present

## 2017-04-01 NOTE — Progress Notes (Signed)
Patient ID: Jeanette Miranda, female   DOB: [birth year], 27 y.o.   MRN: [id number]  Chief Complaint  Patient presents with  . Mass    HPI Jeanette Miranda is a 27 y.o. female who presents for a breast evaluation. The most recent ultrasound  was done on 07/25/2016 . Patient noticed a lump in her right breast in may 2018 at [facility]. The lump has got bigger and painful to sleep on her right side. Very tender to touch. Patient stop drinking caffeine.  Denies nipple discharge   She did breast feed for 10 months.   HPI  Past Medical History:  Diagnosis Date  . Asthma   . Depression     Past Surgical History:  Procedure Laterality Date  . TONSILLECTOMY    . WISDOM TOOTH EXTRACTION Bilateral     Family History  Problem Relation Age of Onset  . Crohn's disease Mother     Social History Social History   Tobacco Use  . Smoking status: Never Smoker  . Smokeless tobacco: Never Used  Substance Use Topics  . Alcohol use: No  . Drug use: No    No Known Allergies  Current Outpatient Medications  Medication Sig Dispense Refill  . albuterol (VENTOLIN HFA) 108 (90 Base) MCG/ACT inhaler Inhale 2 puffs into the lungs every 4 (four) hours as needed for wheezing or shortness of breath. 1 Inhaler 3  . sertraline (ZOLOFT) 100 MG tablet TAKE 1 TABLET BY MOUTH EVERY DAY 30 tablet 4   No current facility-administered medications for this visit.     Review of Systems Review of Systems  Constitutional: Negative.   Respiratory: Negative.   Cardiovascular: Negative.     Blood pressure 116/70, pulse 82, resp. rate 12, height 5\' 1"  (1.549 m), weight 116 lb (52.6 kg), currently breastfeeding.  Physical Exam Physical Exam  Constitutional: She is oriented to person, place, and time. She appears well-developed and well-nourished.  Eyes: Conjunctivae are normal. No scleral icterus.  Neck: Neck supple.  Cardiovascular: Normal rate, regular rhythm and normal heart sounds.   Pulmonary/Chest: Effort normal and breath sounds normal. Right breast exhibits no inverted nipple, no mass, no nipple discharge, no skin change and no tenderness. Left breast exhibits no inverted nipple, no mass, no nipple discharge, no skin change and no tenderness.    Right breast thickening upper outer quadrant.   Lymphadenopathy:    She has no cervical adenopathy.    She has no axillary adenopathy.       Right: No supraclavicular adenopathy present.       Left: No supraclavicular adenopathy present.  Neurological: She is alert and oriented to person, place, and time.  Skin: Skin is warm and dry.    Data Reviewed Right breast ultrasound dated July 25, 2016 reviewed. IMPRESSION: 1. No targeted sonographic abnormalities are identified to account for the patient's focal tenderness in the right breast. No targeted sonographic evidence of right breast malignancy.  As the patient reported increase in size in the area was elected to reassess the area with ultrasound.  Examination showed a heterogeneous echo pattern involving the upper outer quadrant of the right breast.  In comparison to the left breast there was a slight increase in breast parenchymal volume but no discernible echo architectural distortion.  No cystic or solid lesions.  BI-RADS-1.    Assessment    Mastalgia in the upper outer quadrant of the right breast only thought that the chest    Plan  Patient  to return as needed. Patient to take 2 Advil  daily for a week. Than take 1 Advil daily. The patient is aware to use a heating pad as needed for comfort. Call and report in three weeks.    I am reluctant to recommend excisional biopsy as it will produce significant distortion and may not resolve her breast discomfort.    The patient reports she is not a pill taker, but I have encouraged her to have a 3-week trial of anti-inflammatories.  HPI, Physical Exam, Assessment and Plan have been scribed under the direction and  in the presence of Hervey Ard, MD.  Gaspar Cola, CMA  I have completed the exam and reviewed the above documentation for accuracy and completeness.  I agree with the above.  Haematologist has been used and any errors in dictation or transcription are unintentional.  Hervey Ard, M.D., F.A.C.S.   Forest Gleason Shishir Krantz 04/01/2017, 8:32 PM

## 2017-04-01 NOTE — Patient Instructions (Addendum)
Patient to return as needed. The patient is aware to use an antiinflammatory of choice (Advil or Aleve) as needed for comfort.Patient to take 2 Advil  daily for a week. Than take 1 Advil daily.  The patient is aware to use a heating pad as needed for comfort. Call and report in thee weeks.

## 2017-04-22 ENCOUNTER — Encounter: Payer: Self-pay | Admitting: General Surgery

## 2017-04-23 ENCOUNTER — Other Ambulatory Visit: Payer: Self-pay | Admitting: Obstetrics & Gynecology

## 2017-04-23 ENCOUNTER — Encounter: Payer: Self-pay | Admitting: Obstetrics & Gynecology

## 2017-04-23 MED ORDER — SERTRALINE HCL 50 MG PO TABS: 75.0000 mg | ORAL_TABLET | Freq: Every day | ORAL | 9 refills | Status: DC

## 2017-04-23 NOTE — Telephone Encounter (Signed)
Please advise 

## 2017-05-01 ENCOUNTER — Ambulatory Visit: Payer: Medicaid Other | Admitting: General Surgery

## 2017-05-01 ENCOUNTER — Encounter: Payer: Self-pay | Admitting: General Surgery

## 2017-05-01 ENCOUNTER — Ambulatory Visit: Payer: Self-pay

## 2017-05-01 VITALS — BP 118/74 | HR 52 | Resp 12 | Ht 64.0 in | Wt 116.0 lb

## 2017-05-01 DIAGNOSIS — N644 Mastodynia: Secondary | ICD-10-CM | POA: Diagnosis not present

## 2017-05-01 DIAGNOSIS — N6311 Unspecified lump in the right breast, upper outer quadrant: Secondary | ICD-10-CM

## 2017-05-01 NOTE — Progress Notes (Signed)
Patient ID: Jeanette Miranda, female   DOB: 22-Sep-1990, 27 y.o.   MRN: [id number]  Chief Complaint  Patient presents with  . Follow-up    HPI Jeanette Miranda is a 27 y.o. female.  Here for follow up right breast pain. She states no change it still bothers her. She states the spot is smaller but harder. She states the anti inflammatory helped at first but then the pain was still there. She is here with her finance, Jeanette Miranda.  HPI  Past Medical History:  Diagnosis Date  . Asthma   . Depression     Past Surgical History:  Procedure Laterality Date  . TONSILLECTOMY    . WISDOM TOOTH EXTRACTION Bilateral     Family History  Problem Relation Age of Onset  . Crohn's disease Mother     Social History Social History   Tobacco Use  . Smoking status: Never Smoker  . Smokeless tobacco: Never Used  Substance Use Topics  . Alcohol use: No  . Drug use: No    No Known Allergies  Current Outpatient Medications  Medication Sig Dispense Refill  . albuterol (VENTOLIN HFA) 108 (90 Base) MCG/ACT inhaler Inhale 2 puffs into the lungs every 4 (four) hours as needed for wheezing or shortness of breath. 1 Inhaler 3  . sertraline (ZOLOFT) 50 MG tablet Take 1.5 tablets (75 mg total) by mouth daily. 45 tablet 9   No current facility-administered medications for this visit.     Review of Systems Review of Systems  Constitutional: Negative.   Respiratory: Negative.   Cardiovascular: Negative.     Blood pressure 118/74, pulse (!) 52, resp. rate 12, height 5\' 4"  (1.626 m), weight 116 lb (52.6 kg), currently breastfeeding.  Physical Exam Physical Exam  Constitutional: She is oriented to person, place, and time. She appears well-developed and well-nourished.  HENT:  Mouth/Throat: Oropharynx is clear and moist.  Eyes: Conjunctivae are normal. No scleral icterus.  Neck: Neck supple.  Cardiovascular: Normal rate, regular rhythm and normal heart sounds.  Pulmonary/Chest: Effort normal  and breath sounds normal. Right breast exhibits tenderness. Right breast exhibits no inverted nipple, no mass, no nipple discharge and no skin change. Left breast exhibits no inverted nipple, no mass, no nipple discharge, no skin change and no tenderness.    Thickening right breast  Lymphadenopathy:    She has no cervical adenopathy.    She has no axillary adenopathy.  Neurological: She is alert and oriented to person, place, and time.  Skin: Skin is warm and dry.  Psychiatric: Her behavior is normal.    Data Reviewed Ultrasound examination of the right breast focused on the area of maximum tenderness today did show a hypoechoic area with irregular borders at the 930 o'clock position, 4 cm from the nipple.  This measured 0.78 x 0.87 x 0.89 cm.  The adjacent breast parenchyma shows atypical heterogeneous pattern for a patient her age.  As she is failed to show improvement over the long-term with a course of oral anti-inflammatories, and the persistence of the pain for over a year, she was offered the opportunity for vacuum biopsy of the area of focal tenderness.  The area was cleansed with ChloraPrep and 10 cc of 0.5% Xylocaine with 0.25% Marcaine with 1-200,000 units of epinephrine was utilized and well-tolerated.  ChloraPrep was applied to the skin.  A 10-gauge Encor device was advanced under ultrasound guidance in the center of the area with the biopsy channel set to 11 mm.  Approximately 12 core samples were obtained with complete removal of the hypoechoic area.  Scant bleeding was noted.  A postbiopsy clip was placed.  The skin defect was closed with benzoin and Steri-Strip followed by Telfa and Tegaderm dressing.  The procedure was well-tolerated.  Postbiopsy instructions were provided.  Assessment    Focal mastalgia, focal ultrasound area of abnormality, removed.    Plan    The patient will be contacted when biopsy results are available.  Nursing follow-up in 1 week, physician  follow-up in 1 month.     HPI, Physical Exam, Assessment and Plan have been scribed under the direction and in the presence of Jeanette Bellow, MD. Jeanette Fetch, RN  I have completed the exam and reviewed the above documentation for accuracy and completeness.  I agree with the above.  Jeanette Miranda has been used and any errors in dictation or transcription are unintentional.  Jeanette Miranda, M.D., F.A.C.S.   Jeanette Miranda 05/01/2017, 8:11 PM

## 2017-05-01 NOTE — Patient Instructions (Addendum)
The patient is aware to call back for any questions or new concerns.    CARE AFTER BREAST BIOPSY  1. Leave the dressing on that your doctor applied after the biopsy. It is waterproof. You may bathe, shower and/or swim. The dressing can be removed in 3 days, you will see small strips of tape against your skin on the incision. Do not remove these strips they will gradually fall off in about 2-3 weeks. You may use an ice pack on and off for the first 12-24 hours for comfort.  2. You may want to use a gauze,cloth or similar protection in your bra to prevent rubbing against your dressing and incision. This is not necessary, but you may feel more comfortable doing so.  3. It is recommended that you wear a bra day and night to give support to the breast. This will prevent the weight of the breast from pulling on the incision.  4. Your breast may feel hard and lumpy under the incision. Do not be alarmed. This is the underlying stitching of tissue. Softening of this tissue will occur in time.  5. You may have a follow up appointment or phone follow up in one week after your biopsy. The office phone number is [phone].  6. You will notice about a week or two after your office visit that the strips of the tape on your incision will begin to loosen. These may then be removed.  7. Report to your doctor any of the following:  * Severe pain not relieved by your pain medication  *Redness of the incision  * Drainage from the incision  *Fever greater than 101 degrees  

## 2017-05-02 ENCOUNTER — Telehealth: Payer: Self-pay

## 2017-05-02 NOTE — Telephone Encounter (Signed)
-----   Message from Robert Bellow, MD sent at 05/02/2017 10:54 AM EDT ----- These notify the patient I had a telephone report from the laboratory today.  The changes in the breast are not cancer, but are very much likely the source of her pain.  Time will be the telling event.  Follow-up for nurse check in a week and physician check in 3-4 weeks.

## 2017-05-02 NOTE — Telephone Encounter (Signed)
Notified patient as instructed, patient pleased. Discussed follow-up appointments, patient agrees  

## 2017-05-07 ENCOUNTER — Encounter: Payer: Self-pay | Admitting: General Surgery

## 2017-05-07 NOTE — Progress Notes (Signed)
Pathology reviewed: Breast, right, needle core biopsy, 9:30 o'clock - BENIGN FIBROEPITHELIAL LESION - SEE COMMENT Microscopic Comment The biopsies are composed of relatively normal breast tissue with focal usual ductal hyperplasia, entrapped adipose tissue and pseudoangiomatous stromal hyperplasia. 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  This area was tender to palpation without a true palpable component. U/S suggested a faint, hypoechoic lesion which was removed in its entirety.  Will follow clinical course.

## 2017-05-08 ENCOUNTER — Ambulatory Visit (INDEPENDENT_AMBULATORY_CARE_PROVIDER_SITE_OTHER): Payer: Medicaid Other | Admitting: *Deleted

## 2017-05-08 DIAGNOSIS — N6311 Unspecified lump in the right breast, upper outer quadrant: Secondary | ICD-10-CM

## 2017-05-08 NOTE — Patient Instructions (Signed)
The patient is aware to call back for any questions or concerns.  

## 2017-05-08 NOTE — Progress Notes (Signed)
Patient ID: Jeanette Miranda, female   DOB: 02-Mar-1990, 27 y.o.   MRN: [id number]   Patient came in today for a wound check post breast biopsy.  The wound is clean, with no signs of infection noted, small bruising noted. Follow up as scheduled next month. Aware of pathology. The patient is aware to use a heating pad as needed for comfort.

## 2017-06-04 ENCOUNTER — Encounter: Payer: Self-pay | Admitting: Obstetrics & Gynecology

## 2017-06-04 ENCOUNTER — Encounter: Payer: Self-pay | Admitting: General Surgery

## 2017-06-05 ENCOUNTER — Ambulatory Visit: Payer: Medicaid Other | Admitting: General Surgery

## 2017-06-09 ENCOUNTER — Ambulatory Visit (INDEPENDENT_AMBULATORY_CARE_PROVIDER_SITE_OTHER): Payer: Medicaid Other | Admitting: Obstetrics & Gynecology

## 2017-06-09 ENCOUNTER — Encounter: Payer: Self-pay | Admitting: Obstetrics & Gynecology

## 2017-06-09 VITALS — BP 100/60 | Ht 61.0 in | Wt 114.0 lb

## 2017-06-09 DIAGNOSIS — Z124 Encounter for screening for malignant neoplasm of cervix: Secondary | ICD-10-CM

## 2017-06-09 DIAGNOSIS — F3289 Other specified depressive episodes: Secondary | ICD-10-CM | POA: Diagnosis not present

## 2017-06-09 DIAGNOSIS — Z0000 Encounter for general adult medical examination without abnormal findings: Secondary | ICD-10-CM | POA: Diagnosis not present

## 2017-06-09 DIAGNOSIS — Z30433 Encounter for removal and reinsertion of intrauterine contraceptive device: Secondary | ICD-10-CM

## 2017-06-09 MED ORDER — SERTRALINE HCL 50 MG PO TABS: 50.0000 mg | ORAL_TABLET | Freq: Every day | ORAL | 5 refills | Status: DC

## 2017-06-09 NOTE — Progress Notes (Signed)
HPI:      [name] is a 27 y.o. G1P1001 who LMP was Patient's last menstrual period was 04/28/2017., she presents today for her annual examination. The patient has no complaints today. The patient is sexually active. Her last pap: approximate date 2017 and was normal. The patient does perform self breast exams.  There is no notable family history of breast or ovarian cancer in her family.  The patient has regular exercise: yes.  The patient denies current symptoms of depression.    GYN History: Contraception: IUD Liletta year 3 (of 3, when inserted was on 3 year plan)  PMHx: Past Medical History:  Diagnosis Date  . Asthma   . Depression    Past Surgical History:  Procedure Laterality Date  . BREAST BIOPSY    . TONSILLECTOMY    . WISDOM TOOTH EXTRACTION Bilateral    Family History  Problem Relation Age of Onset  . Crohn's disease Mother    Social History   Tobacco Use  . Smoking status: Never Smoker  . Smokeless tobacco: Never Used  Substance Use Topics  . Alcohol use: No  . Drug use: No    Current Outpatient Medications:  .  albuterol (VENTOLIN HFA) 108 (90 Base) MCG/ACT inhaler, Inhale 2 puffs into the lungs every 4 (four) hours as needed for wheezing or shortness of breath., Disp: 1 Inhaler, Rfl: 3 .  sertraline (ZOLOFT) 50 MG tablet, Take 1.5 tablets (75 mg total) by mouth daily., Disp: 45 tablet, Rfl: 9 Allergies: Patient has no known allergies.  Review of Systems  Constitutional: Negative for chills, fever and malaise/fatigue.  HENT: Negative for congestion, sinus pain and sore throat.   Eyes: Negative for blurred vision and pain.  Respiratory: Negative for cough and wheezing.   Cardiovascular: Negative for chest pain and leg swelling.  Gastrointestinal: Negative for abdominal pain, constipation, diarrhea, heartburn, nausea and vomiting.  Genitourinary: Negative for dysuria, frequency, hematuria and urgency.  Musculoskeletal: Negative for back pain,  joint pain, myalgias and neck pain.  Skin: Negative for itching and rash.  Neurological: Negative for dizziness, tremors and weakness.  Endo/Heme/Allergies: Does not bruise/bleed easily.  Psychiatric/Behavioral: Negative for depression. The patient is not nervous/anxious and does not have insomnia.    Objective: BP 100/60   Ht 5\' 1"  (1.549 m)   Wt 114 lb (51.7 kg)   LMP 04/28/2017   BMI 21.54 kg/m   Filed Weights   06/09/17 1324  Weight: 114 lb (51.7 kg)   Body mass index is 21.54 kg/m. Physical Exam  Constitutional: She is oriented to person, place, and time. She appears well-developed and well-nourished. No distress.  Genitourinary: Rectum normal, vagina normal and uterus normal. Pelvic exam was performed with patient supine. There is no rash or lesion on the right labia. There is no rash or lesion on the left labia. Vagina exhibits no lesion. No bleeding in the vagina. Right adnexum does not display mass and does not display tenderness. Left adnexum does not display mass and does not display tenderness. Cervix does not exhibit motion tenderness, lesion, friability or polyp.   Uterus is mobile and midaxial. Uterus is not enlarged or exhibiting a mass.  HENT:  Head: Normocephalic and atraumatic. Head is without laceration.  Right Ear: Hearing normal.  Left Ear: Hearing normal.  Nose: No epistaxis.  No foreign bodies.  Mouth/Throat: Uvula is midline, oropharynx is clear and moist and mucous membranes are normal.  Eyes: Pupils are equal, round, and reactive  to light.  Neck: Normal range of motion. Neck supple. No thyromegaly present.  Cardiovascular: Normal rate and regular rhythm. Exam reveals no gallop and no friction rub.  No murmur heard. Pulmonary/Chest: Effort normal and breath sounds normal. No respiratory distress. She has no wheezes.  Abdominal: Soft. Bowel sounds are normal. She exhibits no distension. There is no tenderness. There is no rebound.  Musculoskeletal: Normal  range of motion.  Neurological: She is alert and oriented to person, place, and time. No cranial nerve deficit.  Skin: Skin is warm and dry.  Psychiatric: She has a normal mood and affect. Judgment normal.  Vitals reviewed.  Assessment:  ANNUAL EXAM 1. Annual physical exam   2. Screening for cervical cancer   3. Encounter for removal and reinsertion of intrauterine contraceptive device              1.  Cervical Screening-  Pap smear done today Annually  2. Breast screening- Recent excisional bx by Dr Bary Castilla, feels improved from pain in breast  3. Counseling for contraception: IUD  Needs exchange as Liletta when placed was originally approved for 3 years.  Desires Mirena which she has had before (prior to pregnancy)  4. Depression.  Desires taper of dose of Zoloft, eventual discontinuation.  Change to 50 mg for 3 mos then 25mg  then off, if tolerates well    F/U  Return in about 1 month (around 07/07/2017) for Follow up IUD.              Also, one month for IUD check up  IUD PROCEDURE:  History of Present Illness:  Jeanette Miranda is a 27 y.o. that had a Liletta IUD placed approximately 3 years ago. Since that time, she states that she has had light period every 8 weeks and tolerates it well.  Physical Exam:  Pelvic exam:  Two IUD strings present seen coming from the cervical os. EGBUS, vaginal vault and cervix: within normal limits  IUD Removal Strings of IUD identified and grasped.  IUD removed without problem.  Pt tolerated this well.  IUD noted to be intact.  IUD Insertion Procedure Note Patient identified, informed consent performed, consent signed.   Discussed risks of irregular bleeding, cramping, infection, malpositioning or misplacement of the IUD outside the uterus which may require further procedure such as laparoscopy, risk of failure <1%. Time out was performed.  Urine pregnancy test negative.  A bimanual exam showed the uterus to be anteverted.  Speculum placed  in the vagina.  Cervix visualized.  Cleaned with Betadine x 2.  Grasped anteriorly with a single tooth tenaculum.  Uterus sounded to 7 cm.   IUD placed per manufacturer's recommendations.  Strings trimmed to 3 cm. Tenaculum was removed, good hemostasis noted.  Patient tolerated procedure well.   Patient was given post-procedure instructions.  She was advised to have backup contraception for one week.  Patient was also asked to check IUD strings periodically and follow up in 4 weeks for IUD check.  Barnett Applebaum, MD, [facility], [facility] 06/09/2017  1:57 PM

## 2017-06-09 NOTE — Patient Instructions (Signed)

## 2017-06-12 LAB — IGP, RFX APTIMA HPV ASCU: PAP SMEAR COMMENT: 0

## 2017-06-27 ENCOUNTER — Other Ambulatory Visit: Payer: Self-pay | Admitting: Obstetrics & Gynecology

## 2017-06-27 NOTE — Telephone Encounter (Signed)
Please advise 

## 2017-07-07 ENCOUNTER — Ambulatory Visit (INDEPENDENT_AMBULATORY_CARE_PROVIDER_SITE_OTHER): Payer: Medicaid Other | Admitting: Obstetrics & Gynecology

## 2017-07-07 ENCOUNTER — Encounter: Payer: Self-pay | Admitting: Obstetrics & Gynecology

## 2017-07-07 VITALS — BP 110/60 | Ht 61.0 in | Wt 118.0 lb

## 2017-07-07 DIAGNOSIS — Z30431 Encounter for routine checking of intrauterine contraceptive device: Secondary | ICD-10-CM

## 2017-07-07 NOTE — Progress Notes (Signed)
  History of Present Illness:  Jeanette Miranda is a 27 y.o. that had a Mirena IUD placed approximately 4 weeks ago. Since that time, she states that she has had no bleeding and pain  PMHx: She  has a past medical history of Asthma and Depression. Also,  has a past surgical history that includes Tonsillectomy; Wisdom tooth extraction (Bilateral); and Breast biopsy., family history includes Crohn's disease in her mother.,  reports that she has never smoked. She has never used smokeless tobacco. She reports that she does not drink alcohol or use drugs. Current Meds  Medication Sig  . albuterol (VENTOLIN HFA) 108 (90 Base) MCG/ACT inhaler Inhale 2 puffs into the lungs every 4 (four) hours as needed for wheezing or shortness of breath.  . levonorgestrel (MIRENA) 20 MCG/24HR IUD 1 each by Intrauterine route once.  . sertraline (ZOLOFT) 50 MG tablet Take 1 tablet (50 mg total) by mouth daily.  .  Also, has No Known Allergies..  Review of Systems  All other systems reviewed and are negative.   Physical Exam:  BP 110/60   Ht 5\' 1"  (1.549 m)   Wt 118 lb (53.5 kg)   BMI 22.30 kg/m  Body mass index is 22.3 kg/m. Constitutional: Well nourished, well developed female in no acute distress.  Abdomen: diffusely non tender to palpation, non distended, and no masses, hernias Neuro: Grossly intact Psych:  Normal mood and affect.    Pelvic exam:  Two IUD strings present seen coming from the cervical os. EGBUS, vaginal vault and cervix: within normal limits  Assessment: IUD strings present in proper location; pt doing well  Plan: She was told to continue to use barrier contraception, in order to prevent any STIs, and to take a home pregnancy test or call us if she ever thinks she may be pregnant, and that her IUD expires in 5 years.  She was amenable to this plan and we will see her back in 1 year/PRN.  A total of 15 minutes were spent face-to-face with the patient during this encounter and over  half of that time dealt with counseling and coordination of care.  Barnett Applebaum, MD, [facility], [facility] 07/07/2017  9:34 AM

## 2017-07-10 ENCOUNTER — Ambulatory Visit (INDEPENDENT_AMBULATORY_CARE_PROVIDER_SITE_OTHER): Payer: Medicaid Other | Admitting: General Surgery

## 2017-07-10 ENCOUNTER — Encounter: Payer: Self-pay | Admitting: General Surgery

## 2017-07-10 VITALS — BP 118/68 | HR 86 | Resp 14 | Ht 61.0 in | Wt 116.0 lb

## 2017-07-10 DIAGNOSIS — D241 Benign neoplasm of right breast: Secondary | ICD-10-CM

## 2017-07-10 NOTE — Progress Notes (Signed)
Patient ID: Jeanette Miranda, female   DOB: 08-17-1990, 27 y.o.   MRN: [id number]  Chief Complaint  Patient presents with  . Follow-up    HPI Jeanette Miranda is a 27 y.o. female here today for her follow up  right breast biopsy. Patient states she is doing well.  HPI  Past Medical History:  Diagnosis Date  . Asthma   . Depression     Past Surgical History:  Procedure Laterality Date  . BREAST BIOPSY    . TONSILLECTOMY    . WISDOM TOOTH EXTRACTION Bilateral     Family History  Problem Relation Age of Onset  . Crohn's disease Mother     Social History Social History   Tobacco Use  . Smoking status: Never Smoker  . Smokeless tobacco: Never Used  Substance Use Topics  . Alcohol use: No  . Drug use: No    No Known Allergies  Current Outpatient Medications  Medication Sig Dispense Refill  . albuterol (VENTOLIN HFA) 108 (90 Base) MCG/ACT inhaler Inhale 2 puffs into the lungs every 4 (four) hours as needed for wheezing or shortness of breath. 1 Inhaler 3  . levonorgestrel (MIRENA) 20 MCG/24HR IUD 1 each by Intrauterine route once.    . sertraline (ZOLOFT) 50 MG tablet Take 1 tablet (50 mg total) by mouth daily. 30 tablet 5   No current facility-administered medications for this visit.     Review of Systems Review of Systems  Constitutional: Negative.   Respiratory: Negative.   Cardiovascular: Negative.     Blood pressure 118/68, pulse 86, resp. rate 14, height 5\' 1"  (1.549 m), weight 116 lb (52.6 kg), currently breastfeeding.  Physical Exam Physical Exam  Constitutional: She is oriented to person, place, and time. She appears well-developed and well-nourished.  Pulmonary/Chest:    Neurological: She is alert and oriented to person, place, and time.  Skin: Skin is warm and dry.    Data Reviewed Pathology benign.  Assessment    Resolution of breast pain with vacuum biopsy.    Plan  Patient to return as needed. The patient is aware to call back  for any questions or concerns.   HPI, Physical Exam, Assessment and Plan have been scribed under the direction and in the presence of Hervey Ard, MD.  Gaspar Cola, CMA  I have completed the exam and reviewed the above documentation for accuracy and completeness.  I agree with the above.  Haematologist has been used and any errors in dictation or transcription are unintentional.  Hervey Ard, M.D., F.A.C.S.  Jeanette Miranda Jeanette Miranda 07/12/2017, 12:16 PM

## 2017-07-10 NOTE — Patient Instructions (Signed)
Patient to return as needed. The patient is aware to call back for any questions or concerns. 

## 2017-11-04 ENCOUNTER — Encounter: Payer: Self-pay | Admitting: Obstetrics & Gynecology

## 2017-11-04 ENCOUNTER — Ambulatory Visit (INDEPENDENT_AMBULATORY_CARE_PROVIDER_SITE_OTHER): Payer: Medicaid Other | Admitting: Obstetrics & Gynecology

## 2017-11-04 VITALS — BP 100/60 | Ht 61.0 in | Wt 120.0 lb

## 2017-11-04 DIAGNOSIS — Z23 Encounter for immunization: Secondary | ICD-10-CM

## 2017-11-04 DIAGNOSIS — F3289 Other specified depressive episodes: Secondary | ICD-10-CM

## 2017-11-04 MED ORDER — SERTRALINE HCL 50 MG PO TABS: 50.0000 mg | ORAL_TABLET | Freq: Every day | ORAL | 8 refills | Status: DC

## 2017-11-04 NOTE — Progress Notes (Signed)
  History of Present Illness:  Jeanette Miranda is a 27 y.o. who was started on  . sertraline (ZOLOFT) 50 MG tablet Take 1 tablet (50 mg total) by mouth daily.   approximately several months ago.  She then has tried to taper down on the dose to 25 mg daily.  Since that time, she states that her symptoms are worsening.  She prefers to resume 50mg  dose.  IUD going well, no bleeding.  PMHx: She  has a past medical history of Asthma and Depression. Also,  has a past surgical history that includes Tonsillectomy; Wisdom tooth extraction (Bilateral); and Breast biopsy., family history includes Crohn's disease in her mother.,  reports that she has never smoked. She has never used smokeless tobacco. She reports that she does not drink alcohol or use drugs. Current Meds  Medication Sig  . albuterol (VENTOLIN HFA) 108 (90 Base) MCG/ACT inhaler Inhale 2 puffs into the lungs every 4 (four) hours as needed for wheezing or shortness of breath.  . levonorgestrel (MIRENA) 20 MCG/24HR IUD 1 each by Intrauterine route once.  . sertraline (ZOLOFT) 50 MG tablet Take 1 tablet (50 mg total) by mouth daily.  . [DISCONTINUED] sertraline (ZOLOFT) 50 MG tablet Take 1 tablet (50 mg total) by mouth daily.  . Also, has No Known Allergies..  Review of Systems  All other systems reviewed and are negative.  Physical Exam:  BP 100/60   Ht 5\' 1"  (1.549 m)   Wt 120 lb (54.4 kg)   BMI 22.67 kg/m  Body mass index is 22.67 kg/m. Constitutional: Well nourished, well developed female in no acute distress.  Abdomen: diffusely non tender to palpation, non distended, and no masses, hernias Neuro: Grossly intact Psych:  Normal mood and affect.    Assessment:   Other depression    -  Primary   Relevant Medications   sertraline (ZOLOFT) 50 MG tablet   Need for immunization against influenza       Relevant Orders   Flu Vaccine QUAD 36+ mos IM (Completed)    Medication treatment is going well for her depression at Zoloft 50  mg daily.  Plan: She will undergo resume Zoloft 50mg  daily in her medical therapy. List of counselors provided to patient, she desires to add this to see if helps. She was amenable to this plan and we will see her back for annual/PRN.  A total of 15 minutes were spent face-to-face with the patient during this encounter and over half of that time dealt with counseling and coordination of care.  Barnett Applebaum, MD, [facility], [facility] 11/04/2017  10:58 AM

## 2017-11-04 NOTE — Patient Instructions (Signed)
Referral List   Psychiatry      Counselors  Peter Garter, MD     Alcide Clever, [place] [phone]      [place], PhD        [facility]        [phone]         Janie Morning, PhD        [place]        [phone]    [facility] [place]  Also: Jeanette Miranda is out of the [facility], is a LCSW who specializes in mental health. She sees children and adults. [phone].

## 2017-12-12 ENCOUNTER — Encounter: Payer: Self-pay | Admitting: Obstetrics & Gynecology

## 2017-12-16 NOTE — Telephone Encounter (Signed)
Called and left voice mail for patient to call back to be schedule °

## 2017-12-16 NOTE — Telephone Encounter (Signed)
Patient is schedule 12/18/17

## 2017-12-16 NOTE — Telephone Encounter (Signed)
[facility] [place] this week and then f/u w me too (OK to overbook me)

## 2017-12-18 ENCOUNTER — Other Ambulatory Visit: Payer: Self-pay | Admitting: Obstetrics & Gynecology

## 2017-12-18 ENCOUNTER — Encounter: Payer: Self-pay | Admitting: Obstetrics & Gynecology

## 2017-12-18 ENCOUNTER — Ambulatory Visit (INDEPENDENT_AMBULATORY_CARE_PROVIDER_SITE_OTHER): Payer: Medicaid Other | Admitting: Obstetrics & Gynecology

## 2017-12-18 ENCOUNTER — Ambulatory Visit (INDEPENDENT_AMBULATORY_CARE_PROVIDER_SITE_OTHER): Payer: Medicaid Other

## 2017-12-18 VITALS — BP 100/60 | Ht 61.0 in | Wt 119.0 lb

## 2017-12-18 DIAGNOSIS — R1032 Left lower quadrant pain: Secondary | ICD-10-CM

## 2017-12-18 DIAGNOSIS — N8302 Follicular cyst of left ovary: Secondary | ICD-10-CM | POA: Diagnosis not present

## 2017-12-18 DIAGNOSIS — N926 Irregular menstruation, unspecified: Secondary | ICD-10-CM | POA: Diagnosis not present

## 2017-12-18 DIAGNOSIS — T839XXA Unspecified complication of genitourinary prosthetic device, implant and graft, initial encounter: Secondary | ICD-10-CM

## 2017-12-18 MED ORDER — NORETHINDRONE 0.35 MG PO TABS: 1.0000 | ORAL_TABLET | Freq: Every day | ORAL | 11 refills | Status: DC

## 2017-12-18 NOTE — Patient Instructions (Signed)
Norethindrone tablets (contraception) What is this medicine? NORETHINDRONE (nor eth IN drone) is an oral contraceptive. The product contains a female hormone known as a progestin. It is used to prevent pregnancy. This medicine may be used for other purposes; ask your health care provider or pharmacist if you have questions. COMMON BRAND NAME(S): Camila, Deblitane 28-Day, Errin, Heather, [place], Jolivette, [place], Nor-QD, Nora-BE, Norlyroc, Ortho Micronor, [employer] 28-Day What should I tell my health care provider before I take this medicine? They need to know if you have any of these conditions: -blood vessel disease or blood clots -breast, cervical, or vaginal cancer -diabetes -heart disease -kidney disease -liver disease -mental depression -migraine -seizures -stroke -vaginal bleeding -an unusual or allergic reaction to norethindrone, other medicines, foods, dyes, or preservatives -pregnant or trying to get pregnant -breast-feeding How should I use this medicine? Take this medicine by mouth with a glass of water. You may take it with or without food. Follow the directions on the prescription label. Take this medicine at the same time each day and in the order directed on the package. Do not take your medicine more often than directed. Contact your pediatrician regarding the use of this medicine in children. Special care may be needed. This medicine has been used in female children who have started having menstrual periods. A patient package insert for the product will be given with each prescription and refill. Read this sheet carefully each time. The sheet may change frequently. Overdosage: If you think you have taken too much of this medicine contact a poison control center or emergency room at once. NOTE: This medicine is only for you. Do not share this medicine with others. What if I miss a dose? Try not to miss a dose. Every time you miss a dose or take a dose late your chance of  pregnancy increases. When 1 pill is missed (even if only 3 hours late), take the missed pill as soon as possible and continue taking a pill each day at the regular time (use a back up method of birth control for the next 48 hours). If more than 1 dose is missed, use an additional birth control method for the rest of your pill pack until menses occurs. Contact your health care professional if more than 1 dose has been missed. What may interact with this medicine? Do not take this medicine with any of the following medications: -amprenavir or fosamprenavir -bosentan This medicine may also interact with the following medications: -antibiotics or medicines for infections, especially rifampin, rifabutin, rifapentine, and griseofulvin, and possibly penicillins or tetracyclines -aprepitant -barbiturate medicines, such as phenobarbital -carbamazepine -felbamate -modafinil -oxcarbazepine -phenytoin -ritonavir or other medicines for HIV infection or AIDS -St. John's wort -topiramate This list may not describe all possible interactions. Give your health care provider a list of all the medicines, herbs, non-prescription drugs, or dietary supplements you use. Also tell them if you smoke, drink alcohol, or use illegal drugs. Some items may interact with your medicine. What should I watch for while using this medicine? Visit your doctor or health care professional for regular checks on your progress. You will need a regular breast and pelvic exam and Pap smear while on this medicine. Use an additional method of birth control during the first cycle that you take these tablets. If you have any reason to think you are pregnant, stop taking this medicine right away and contact your doctor or health care professional. If you are taking this medicine for hormone related problems, it  may take several cycles of use to see improvement in your condition. This medicine does not protect you against HIV infection (AIDS)  or any other sexually transmitted diseases. What side effects may I notice from receiving this medicine? Side effects that you should report to your doctor or health care professional as soon as possible: -breast tenderness or discharge -pain in the abdomen, chest, groin or leg -severe headache -skin rash, itching, or hives -sudden shortness of breath -unusually weak or tired -vision or speech problems -yellowing of skin or eyes Side effects that usually do not require medical attention (report to your doctor or health care professional if they continue or are bothersome): -changes in sexual desire -change in menstrual flow -facial hair growth -fluid retention and swelling -headache -irritability -nausea -weight gain or loss This list may not describe all possible side effects. Call your doctor for medical advice about side effects. You may report side effects to FDA at 1-800-FDA-1088. Where should I keep my medicine? Keep out of the reach of children. Store at room temperature between 15 and 30 degrees C (59 and 86 degrees F). Throw away any unused medicine after the expiration date. NOTE: This sheet is a summary. It may not cover all possible information. If you have questions about this medicine, talk to your doctor, pharmacist, or health care provider.  2018 Elsevier/Gold Standard (2011-10-04 16:41:35)

## 2017-12-18 NOTE — Progress Notes (Signed)
HPI: Abdominal Pain Patient presents for evaluation of abdominal pain. The pain is described as aching and cramping, and is 6/10 in intensity. Pain is located in the LLQ area without radiation. Onset was intermittent occurring 1 month ago. Symptoms have been off and on since. Aggravating factors: sex. Alleviating factors: none. Associated symptoms: none. The patient denies chills, constipation, diarrhea, dysuria, fever, flatus, frequency, headache, nausea and vomiting. Risk factors for pelvic/abdominal pain include none. Has had Mirena since 04/2017, had Liletta prior and did not do well, had Mirena for 5 years prior to her past pregnancy without problems.  Usually has no period.  Did have some bloody discharge for 4 days in Oct when pain first started.  Did not tolerate Depo in past.  Ultrasound demonstrates cyst seen though small, see below.  IUD in place. These findings are unclear as to true etiology  PMHx: She  has a past medical history of Asthma and Depression. Also,  has a past surgical history that includes Tonsillectomy; Wisdom tooth extraction (Bilateral); and Breast biopsy., family history includes Crohn's disease in her mother.,  reports that she has never smoked. She has never used smokeless tobacco. She reports that she does not drink alcohol or use drugs.  She has a current medication list which includes the following prescription(s): albuterol, levonorgestrel, norethindrone, and sertraline. Also, has No Known Allergies.  Review of Systems  Constitutional: Negative for chills, fever and malaise/fatigue.  HENT: Negative for congestion, sinus pain and sore throat.   Eyes: Negative for blurred vision and pain.  Respiratory: Negative for cough and wheezing.   Cardiovascular: Negative for chest pain and leg swelling.  Gastrointestinal: Negative for abdominal pain, constipation, diarrhea, heartburn, nausea and vomiting.  Genitourinary: Negative for dysuria, frequency, hematuria and  urgency.  Musculoskeletal: Negative for back pain, joint pain, myalgias and neck pain.  Skin: Negative for itching and rash.  Neurological: Negative for dizziness, tremors and weakness.  Endo/Heme/Allergies: Does not bruise/bleed easily.  Psychiatric/Behavioral: Negative for depression. The patient is not nervous/anxious and does not have insomnia.     Objective: BP 100/60   Ht 5\' 1"  (1.549 m)   Wt 119 lb (54 kg)   BMI 22.48 kg/m   Physical examination Constitutional NAD, Conversant  Skin No rashes, lesions or ulceration.   Extremities: Moves all appropriately.  Normal ROM for age. No lymphadenopathy.  Neuro: Grossly intact  Psych: Oriented to PPT.  Normal mood. Normal affect.   US Pelvic Complete With Transvaginal  Result Date: 12/18/2017 ULTRASOUND REPORT Location: [facility] Date of Service: 12/18/2017 Patient Name: Jeanette Miranda DOB: December 18, 1990 MRN: [id number] Indications:LLQ PAIN Findings: The uterus is anteverted and measures 7.54 X 4.63 X 2.97 CM. Echo texture is homogenous without evidence of focal masses. The Endometrium measures 1.94 mm. - IUD seen in proper location. Right Ovary measures 3.14 X 2.23 X 1.34 cm. It is normal in appearance. Left Ovary measures 4.06 X 2.44 X 1.67 cm. It contain follicle measuring 53.9 x 8.1 x 13.2 mm. Survey of the adnexa demonstrates no adnexal masses. There is no free fluid in the cul de sac. Impression: 1. Left ovary contain follicle measuring 76.7 x 8.1 x 13.2 mm. 2. IUD seen in proper location. Recommendations: 1.Clinical correlation with the patient's History and Physical Exam. Mital bahen Marlowe Sax, RDMS Review of ULTRASOUND.    I have personally reviewed images and report of recent ultrasound done at [facility].    Plan of management to be discussed with patient. Barnett Applebaum, MD,  [facility], [facility] 12/18/2017  1:45 PM    Assessment:  LLQ pain Recent Irregular menses Ovarian cyst Concern for possible  endometriosis  Options discussed    Dx Lap to assess for adhesions, endometriosis, other; treat cyst (really a follicle and unlikely source of her pain, as discussed)    Cont as is and map sx's    Adjust hormones w pill on top of IUD for 2 mos, then re-assess May improve, then worsen again, then consider endometriosis more so    Remove IUD and try something else, in case IUD itself or hormones are part of etiology (unlikely as has used many years before)  Will keep IUD and take norethinedrone for 2 mos and see  A total of 25 minutes were spent face-to-face with the patient during this encounter and over half of that time dealt with counseling and coordination of care.  Barnett Applebaum, MD, [facility], [facility] 12/18/2017  2:00 PM

## 2018-02-17 ENCOUNTER — Ambulatory Visit (INDEPENDENT_AMBULATORY_CARE_PROVIDER_SITE_OTHER): Payer: Medicaid Other | Admitting: Obstetrics & Gynecology

## 2018-02-17 ENCOUNTER — Encounter: Payer: Self-pay | Admitting: Obstetrics & Gynecology

## 2018-02-17 VITALS — BP 100/60 | Ht 61.0 in | Wt 118.0 lb

## 2018-02-17 DIAGNOSIS — N926 Irregular menstruation, unspecified: Secondary | ICD-10-CM

## 2018-02-17 DIAGNOSIS — R1032 Left lower quadrant pain: Secondary | ICD-10-CM | POA: Diagnosis not present

## 2018-02-17 MED ORDER — PROVIDA OB 20-20-1.25 MG PO CAPS: 1.0000 | ORAL_CAPSULE | Freq: Every day | ORAL | 11 refills | Status: AC

## 2018-02-17 NOTE — Progress Notes (Signed)
  History of Present Illness:  Jeanette Miranda is a 28 y.o. who was started on  . norethindrone (MICRONOR,CAMILA,ERRIN) 0.35 MG tablet Take 1 tablet (0.35 mg total) by mouth daily.   approximately 2 months ago. Since that time, she states that her symptoms are improving.  She has had no further LLQ pain.  Her menses are absent.  No PMS.  PMHx: She  has a past medical history of Asthma and Depression. Also,  has a past surgical history that includes Tonsillectomy; Wisdom tooth extraction (Bilateral); and Breast biopsy., family history includes Crohn's disease in her mother.,  reports that she has never smoked. She has never used smokeless tobacco. She reports that she does not drink alcohol or use drugs. Current Meds  Medication Sig  . albuterol (VENTOLIN HFA) 108 (90 Base) MCG/ACT inhaler Inhale 2 puffs into the lungs every 4 (four) hours as needed for wheezing or shortness of breath.  . levonorgestrel (MIRENA) 20 MCG/24HR IUD 1 each by Intrauterine route once.  . norethindrone (MICRONOR,CAMILA,ERRIN) 0.35 MG tablet Take 1 tablet (0.35 mg total) by mouth daily.  . sertraline (ZOLOFT) 50 MG tablet Take 1 tablet (50 mg total) by mouth daily.  . Also, has No Known Allergies..  Review of Systems  All other systems reviewed and are negative.   Physical Exam:  BP 100/60   Ht 5\' 1"  (1.549 m)   Wt 118 lb (53.5 kg)   BMI 22.30 kg/m  Body mass index is 22.3 kg/m. Constitutional: Well nourished, well developed female in no acute distress.  Abdomen: diffusely non tender to palpation, non distended, and no masses, hernias Neuro: Grossly intact Psych:  Normal mood and affect.    Assessment:  Problem List Items Addressed This Visit      Other   LLQ pain - Primary    Other Visit Diagnoses    Irregular menses         Medication treatment is going well for her pain and menstrual regulation.  Plan: She will undergo no change in her medical therapy.  She plans for trying for pregnancy this  fall, and would like IUD out this July, along w cessation of pill then.  I agree to this plan, as compared to stopping pill now w possibility of return of pain (and endometriosis).  No surgery recommended now as she is improved in her pain.  May need future surgery if pain resumes or infertility is diagnosed.  Pt to start [facility] PNV now to have adequate levels by time she attempts to conceive.  A total of 15 minutes were spent face-to-face with the patient during this encounter and over half of that time dealt with counseling and coordination of care.  Barnett Applebaum, MD, [facility], [facility] 02/17/2018  10:06 AM

## 2018-02-20 ENCOUNTER — Encounter: Payer: Self-pay | Admitting: Obstetrics & Gynecology

## 2018-02-23 ENCOUNTER — Other Ambulatory Visit: Payer: Self-pay | Admitting: Obstetrics & Gynecology

## 2018-02-23 MED ORDER — SERTRALINE HCL 50 MG PO TABS: 50.0000 mg | ORAL_TABLET | Freq: Every day | ORAL | 8 refills | Status: DC

## 2018-02-25 ENCOUNTER — Other Ambulatory Visit: Payer: Self-pay | Admitting: Obstetrics & Gynecology

## 2018-05-25 LAB — FETAL NONSTRESS TEST

## 2018-08-03 ENCOUNTER — Encounter: Payer: Self-pay | Admitting: Obstetrics & Gynecology

## 2018-08-04 NOTE — Telephone Encounter (Signed)
Called and spoke with patient. She is reschedule to Wednesday, 08/26/18 with [place]

## 2018-08-18 ENCOUNTER — Ambulatory Visit: Payer: Medicaid Other | Admitting: Obstetrics & Gynecology

## 2018-08-26 ENCOUNTER — Encounter: Payer: Self-pay | Admitting: Obstetrics & Gynecology

## 2018-08-26 ENCOUNTER — Ambulatory Visit (INDEPENDENT_AMBULATORY_CARE_PROVIDER_SITE_OTHER): Payer: Medicaid Other | Admitting: Obstetrics & Gynecology

## 2018-08-26 ENCOUNTER — Other Ambulatory Visit: Payer: Self-pay

## 2018-08-26 VITALS — BP 90/60 | Ht 61.0 in | Wt 111.0 lb

## 2018-08-26 DIAGNOSIS — Z0000 Encounter for general adult medical examination without abnormal findings: Secondary | ICD-10-CM | POA: Diagnosis not present

## 2018-08-26 DIAGNOSIS — N809 Endometriosis, unspecified: Secondary | ICD-10-CM | POA: Diagnosis not present

## 2018-08-26 DIAGNOSIS — Z319 Encounter for procreative management, unspecified: Secondary | ICD-10-CM

## 2018-08-26 NOTE — Progress Notes (Signed)
  History of Present Illness:  Jeanette Miranda is a 28 y.o. that had a Mirena IUD placed approximately 1 years ago. Since that time, she states that she is now ready to try for pregnancy.  Started PNV months ago.  Stopped progesterone pills (that were supplementing hormones of IUD for endometriosis control of sx's of pain) last month, no pain currently.  Periods have been rare and light.  The following portions of the patient's history were reviewed and updated as appropriate: allergies, current medications, past family history, past medical history, past social history, past surgical history and problem list.  Patient Active Problem List   Diagnosis Date Noted  . Endometriosis 08/26/2018  . LLQ pain 12/18/2017  . Mastalgia 04/01/2017  . Breast mass in female 03/07/2017   Medications:  Current Outpatient Medications on File Prior to Visit  Medication Sig Dispense Refill  . albuterol (VENTOLIN HFA) 108 (90 Base) MCG/ACT inhaler Inhale 2 puffs into the lungs every 4 (four) hours as needed for wheezing or shortness of breath. 1 Inhaler 3  . levonorgestrel (MIRENA) 20 MCG/24HR IUD 1 each by Intrauterine route once.    . sertraline (ZOLOFT) 50 MG tablet TAKE 1 TABLET BY MOUTH EVERY DAY 30 tablet 5  . norethindrone (MICRONOR,CAMILA,ERRIN) 0.35 MG tablet Take 1 tablet (0.35 mg total) by mouth daily. (Patient not taking: Reported on 08/26/2018) 1 Package 11   No current facility-administered medications on file prior to visit.    Allergies: has No Known Allergies.  Physical Exam:  BP 90/60   Ht 5\' 1"  (1.549 m)   Wt 111 lb (50.3 kg)   BMI 20.97 kg/m  Body mass index is 20.97 kg/m. Constitutional: Well nourished, well developed female in no acute distress.  Abdomen: diffusely non tender to palpation, non distended, and no masses, hernias Neuro: Grossly intact Psych:  Normal mood and affect.    Pelvic exam:  Two IUD strings present seen coming from the cervical os. EGBUS, vaginal vault and  cervix: within normal limits  IUD Removal Strings of IUD identified and grasped.  IUD removed without problem.  Pt tolerated this well.  IUD noted to be intact.  Assessment: 1. Endometriosis 2. Desire for pregnancy  IUD Removal  Plan: IUD removed and plan for contraception is no method, as she plans to try for pregnancy. She was amenable to this plan. Monitor for s/sx endometriosis, also infertility, although likely to achieve pregnancy on her own in the next 6-12 mos.  Barnett Applebaum, M.D. 08/26/2018 11:07 AM

## 2018-09-19 ENCOUNTER — Encounter: Payer: Self-pay | Admitting: Obstetrics & Gynecology

## 2018-11-03 ENCOUNTER — Telehealth: Payer: Self-pay

## 2018-11-03 ENCOUNTER — Other Ambulatory Visit: Payer: Self-pay | Admitting: Maternal Newborn

## 2018-11-03 DIAGNOSIS — O219 Vomiting of pregnancy, unspecified: Secondary | ICD-10-CM

## 2018-11-03 MED ORDER — DOXYLAMINE-PYRIDOXINE 10-10 MG PO TBEC: 2.0000 | DELAYED_RELEASE_TABLET | Freq: Every day | ORAL | 3 refills | Status: DC

## 2018-11-03 NOTE — Telephone Encounter (Signed)
Pt aware.

## 2018-11-03 NOTE — Telephone Encounter (Signed)
Rx sent for Diclegis.

## 2018-11-03 NOTE — Telephone Encounter (Signed)
Pt has an upcoming NOB appointment on Friday 10/9 with Dr. Kenton Kingfisher. She is having extreme morning sickness. Can he call something in to [facility] [address]

## 2018-11-03 NOTE — Progress Notes (Signed)
Rx for Diclegis.

## 2018-11-06 ENCOUNTER — Encounter: Payer: Self-pay | Admitting: Obstetrics & Gynecology

## 2018-11-06 ENCOUNTER — Other Ambulatory Visit: Payer: Self-pay

## 2018-11-06 ENCOUNTER — Ambulatory Visit (INDEPENDENT_AMBULATORY_CARE_PROVIDER_SITE_OTHER): Payer: Medicaid Other | Admitting: Obstetrics & Gynecology

## 2018-11-06 ENCOUNTER — Other Ambulatory Visit (HOSPITAL_COMMUNITY)
Admission: RE | Admit: 2018-11-06 | Discharge: 2018-11-06 | Disposition: A | Discharge status: Home / Self Care | Payer: Medicaid Other | Source: Ambulatory Visit | Attending: Obstetrics & Gynecology | Admitting: Obstetrics & Gynecology

## 2018-11-06 VITALS — BP 92/50 | Wt 110.0 lb

## 2018-11-06 DIAGNOSIS — Z3201 Encounter for pregnancy test, result positive: Secondary | ICD-10-CM

## 2018-11-06 DIAGNOSIS — Z3491 Encounter for supervision of normal pregnancy, unspecified, first trimester: Secondary | ICD-10-CM

## 2018-11-06 DIAGNOSIS — Z124 Encounter for screening for malignant neoplasm of cervix: Secondary | ICD-10-CM | POA: Diagnosis present

## 2018-11-06 DIAGNOSIS — Z3A01 Less than 8 weeks gestation of pregnancy: Secondary | ICD-10-CM

## 2018-11-06 DIAGNOSIS — Z3490 Encounter for supervision of normal pregnancy, unspecified, unspecified trimester: Secondary | ICD-10-CM

## 2018-11-06 DIAGNOSIS — N912 Amenorrhea, unspecified: Secondary | ICD-10-CM

## 2018-11-06 LAB — POCT URINE PREGNANCY: Preg Test, Ur: POSITIVE — AB

## 2018-11-06 LAB — OB RESULTS CONSOLE VARICELLA ZOSTER ANTIBODY, IGG: Varicella: IMMUNE

## 2018-11-06 MED ORDER — ONDANSETRON 4 MG PO TBDP: 4.0000 mg | ORAL_TABLET | Freq: Four times a day (QID) | ORAL | 0 refills | Status: DC | PRN

## 2018-11-06 NOTE — Patient Instructions (Signed)
First Trimester of Pregnancy The first trimester of pregnancy is from week 1 until the end of week 13 (months 1 through 3). A week after a sperm fertilizes an egg, the egg will implant on the wall of the uterus. This embryo will begin to develop into a baby. Genes from you and your partner will form the baby. The female genes will determine whether the baby will be a boy or a girl. At 6-8 weeks, the eyes and face will be formed, and the heartbeat can be seen on ultrasound. At the end of 12 weeks, all the baby's organs will be formed. Now that you are pregnant, you will want to do everything you can to have a healthy baby. Two of the most important things are to get good prenatal care and to follow your health care provider's instructions. Prenatal care is all the medical care you receive before the baby's birth. This care will help prevent, find, and treat any problems during the pregnancy and childbirth. Body changes during your first trimester Your body goes through many changes during pregnancy. The changes vary from woman to woman.  You may gain or lose a couple of pounds at first.  You may feel sick to your stomach (nauseous) and you may throw up (vomit). If the vomiting is uncontrollable, call your health care provider.  You may tire easily.  You may develop headaches that can be relieved by medicines. All medicines should be approved by your health care provider.  You may urinate more often. Painful urination may mean you have a bladder infection.  You may develop heartburn as a result of your pregnancy.  You may develop constipation because certain hormones are causing the muscles that push stool through your intestines to slow down.  You may develop hemorrhoids or swollen veins (varicose veins).  Your breasts may begin to grow larger and become tender. Your nipples may stick out more, and the tissue that surrounds them (areola) may become darker.  Your gums may bleed and may be  sensitive to brushing and flossing.  Dark spots or blotches (chloasma, mask of pregnancy) may develop on your face. This will likely fade after the baby is born.  Your menstrual periods will stop.  You may have a loss of appetite.  You may develop cravings for certain kinds of food.  You may have changes in your emotions from day to day, such as being excited to be pregnant or being concerned that something may go wrong with the pregnancy and baby.  You may have more vivid and strange dreams.  You may have changes in your hair. These can include thickening of your hair, rapid growth, and changes in texture. Some women also have hair loss during or after pregnancy, or hair that feels dry or thin. Your hair will most likely return to normal after your baby is born. What to expect at prenatal visits During a routine prenatal visit:  You will be weighed to make sure you and the baby are growing normally.  Your blood pressure will be taken.  Your abdomen will be measured to track your baby's growth.  The fetal heartbeat will be listened to between weeks 10 and 14 of your pregnancy.  Test results from any previous visits will be discussed. Your health care provider may ask you:  How you are feeling.  If you are feeling the baby move.  If you have had any abnormal symptoms, such as leaking fluid, bleeding, severe headaches, or abdominal   cramping.  If you are using any tobacco products, including cigarettes, chewing tobacco, and electronic cigarettes.  If you have any questions. Other tests that may be performed during your first trimester include:  Blood tests to find your blood type and to check for the presence of any previous infections. The tests will also be used to check for low iron levels (anemia) and protein on red blood cells (Rh antibodies). Depending on your risk factors, or if you previously had diabetes during pregnancy, you may have tests to check for high blood sugar  that affects pregnant women (gestational diabetes).  Urine tests to check for infections, diabetes, or protein in the urine.  An ultrasound to confirm the proper growth and development of the baby.  Fetal screens for spinal cord problems (spina bifida) and Down syndrome.  HIV (human immunodeficiency virus) testing. Routine prenatal testing includes screening for HIV, unless you choose not to have this test.  You may need other tests to make sure you and the baby are doing well. Follow these instructions at home: Medicines  Follow your health care provider's instructions regarding medicine use. Specific medicines may be either safe or unsafe to take during pregnancy.  Take a prenatal vitamin that contains at least 600 micrograms (mcg) of folic acid.  If you develop constipation, try taking a stool softener if your health care provider approves. Eating and drinking   Eat a balanced diet that includes fresh fruits and vegetables, whole grains, good sources of protein such as meat, eggs, or tofu, and low-fat dairy. Your health care provider will help you determine the amount of weight gain that is right for you.  Avoid raw meat and uncooked cheese. These carry germs that can cause birth defects in the baby.  Eating four or five small meals rather than three large meals a day may help relieve nausea and vomiting. If you start to feel nauseous, eating a few soda crackers can be helpful. Drinking liquids between meals, instead of during meals, also seems to help ease nausea and vomiting.  Limit foods that are high in fat and processed sugars, such as fried and sweet foods.  To prevent constipation: ? Eat foods that are high in fiber, such as fresh fruits and vegetables, whole grains, and beans. ? Drink enough fluid to keep your urine clear or pale yellow. Activity  Exercise only as directed by your health care provider. Most women can continue their usual exercise routine during  pregnancy. Try to exercise for 30 minutes at least 5 days a week. Exercising will help you: ? Control your weight. ? Stay in shape. ? Be prepared for labor and delivery.  Experiencing pain or cramping in the lower abdomen or lower back is a good sign that you should stop exercising. Check with your health care provider before continuing with normal exercises.  Try to avoid standing for long periods of time. Move your legs often if you must stand in one place for a long time.  Avoid heavy lifting.  Wear low-heeled shoes and practice good posture.  You may continue to have sex unless your health care provider tells you not to. Relieving pain and discomfort  Wear a good support bra to relieve breast tenderness.  Take warm sitz baths to soothe any pain or discomfort caused by hemorrhoids. Use hemorrhoid cream if your health care provider approves.  Rest with your legs elevated if you have leg cramps or low back pain.  If you develop varicose veins in   your legs, wear support hose. Elevate your feet for 15 minutes, 3-4 times a day. Limit salt in your diet. Prenatal care  Schedule your prenatal visits by the twelfth week of pregnancy. They are usually scheduled monthly at first, then more often in the last 2 months before delivery.  Write down your questions. Take them to your prenatal visits.  Keep all your prenatal visits as told by your health care provider. This is important. Safety  Wear your seat belt at all times when driving.  Make a list of emergency phone numbers, including numbers for family, friends, the hospital, and police and fire departments. General instructions  Ask your health care provider for a referral to a local prenatal education class. Begin classes no later than the beginning of month 6 of your pregnancy.  Ask for help if you have counseling or nutritional needs during pregnancy. Your health care provider can offer advice or refer you to specialists for help  with various needs.  Do not use hot tubs, steam rooms, or saunas.  Do not douche or use tampons or scented sanitary pads.  Do not cross your legs for long periods of time.  Avoid cat litter boxes and soil used by cats. These carry germs that can cause birth defects in the baby and possibly loss of the fetus by miscarriage or stillbirth.  Avoid all smoking, herbs, alcohol, and medicines not prescribed by your health care provider. Chemicals in these products affect the formation and growth of the baby.  Do not use any products that contain nicotine or tobacco, such as cigarettes and e-cigarettes. If you need help quitting, ask your health care provider. You may receive counseling support and other resources to help you quit.  Schedule a dentist appointment. At home, brush your teeth with a soft toothbrush and be gentle when you floss. Contact a health care provider if:  You have dizziness.  You have mild pelvic cramps, pelvic pressure, or nagging pain in the abdominal area.  You have persistent nausea, vomiting, or diarrhea.  You have a bad smelling vaginal discharge.  You have pain when you urinate.  You notice increased swelling in your face, hands, legs, or ankles.  You are exposed to fifth disease or chickenpox.  You are exposed to German measles (rubella) and have never had it. Get help right away if:  You have a fever.  You are leaking fluid from your vagina.  You have spotting or bleeding from your vagina.  You have severe abdominal cramping or pain.  You have rapid weight gain or loss.  You vomit blood or material that looks like coffee grounds.  You develop a severe headache.  You have shortness of breath.  You have any kind of trauma, such as from a fall or a car accident. Summary  The first trimester of pregnancy is from week 1 until the end of week 13 (months 1 through 3).  Your body goes through many changes during pregnancy. The changes vary from  woman to woman.  You will have routine prenatal visits. During those visits, your health care provider will examine you, discuss any test results you may have, and talk with you about how you are feeling. This information is not intended to replace advice given to you by your health care provider. Make sure you discuss any questions you have with your health care provider. Document Released: 01/08/2001 Document Revised: 12/27/2016 Document Reviewed: 12/27/2015 [employer]vier Patient Education  2020 [employer]  

## 2018-11-06 NOTE — Progress Notes (Signed)
11/06/2018   Chief Complaint: Missed period  Transfer of Care Patient: no Prior OB care and GYN care at [facility]  History of Present Illness: Jeanette Miranda is a 28 y.o. G2P1001 [redacted]w[redacted]d based on Patient's last menstrual period was 09/27/2018 (exact date). with an Estimated Date of Delivery: 07/04/19, with the above CC.   Her periods were: She had one period since the removal of her IUD in July (LMP 09/27/18) She was using no method when she conceived.  She has Positive signs or symptoms of nausea/vomiting of pregnancy. She has Negative signs or symptoms of miscarriage or preterm labor She identifies Negative Zika risk factors for her and her partner On any different medications around the time she conceived/early pregnancy: Yes - Zoloft (50 mg, has been on for years including during her first pregnancy) History of varicella: Yes   ROS: A 12-point review of systems was performed and negative, except as stated in the above HPI.  OBGYN History: As per HPI. OB History  Gravida Para Term Preterm AB Living  2 1 1     1   SAB TAB Ectopic Multiple Live Births        0 1    # Outcome Date GA Lbr Len/2nd Weight Sex Delivery Anes PTL Lv  2 Current           1 Term 07/10/14 [redacted]w[redacted]d 15:40 / 00:53 6 lb 14.8 oz (3.14 kg) M Vag-Spont EPI  LIV    Obstetric Comments  1st Menstrual Cycle:  13  1st Pregnancy:  23    Any issues with any prior pregnancies: no Any prior children are healthy, doing well, without any problems or issues: yes History of pap smears: Yes. Last pap smear normal History of STIs: No   Past Medical History: Past Medical History:  Diagnosis Date  . Asthma   . Depression     Past Surgical History: Past Surgical History:  Procedure Laterality Date  . BREAST BIOPSY    . TONSILLECTOMY    . WISDOM TOOTH EXTRACTION Bilateral     Family History:  Family History  Problem Relation Age of Onset  . Crohn's disease Mother    She denies any female cancers, bleeding or blood clotting  disorders.  She denies any history of mental retardation, birth defects or genetic disorders in her or the FOB's history  Social History:  Social History   Socioeconomic History  . Marital status: Married    Spouse name: Not on file  . Number of children: Not on file  . Years of education: Not on file  . Highest education level: Not on file  Occupational History  . Not on file  Social Needs  . Financial resource strain: Not on file  . Food insecurity    Worry: Not on file    Inability: Not on file  . Transportation needs    Medical: Not on file    Non-medical: Not on file  Tobacco Use  . Smoking status: Never Smoker  . Smokeless tobacco: Never Used  Substance and Sexual Activity  . Alcohol use: No  . Drug use: No  . Sexual activity: Yes    Birth control/protection: None  Lifestyle  . Physical activity    Days per week: Not on file    Minutes per session: Not on file  . Stress: Not on file  Relationships  . Social Herbalist on phone: Not on file    Gets together: Not on file  Attends religious service: Not on file    Active member of club or organization: Not on file    Attends meetings of clubs or organizations: Not on file    Relationship status: Not on file  . Intimate partner violence    Fear of current or ex partner: Not on file    Emotionally abused: Not on file    Physically abused: Not on file    Forced sexual activity: Not on file  Other Topics Concern  . Not on file  Social History Narrative  . Not on file   Any pets in the household: no  Allergy: No Known Allergies  Current Outpatient Medications:  Current Outpatient Medications:  .  albuterol (VENTOLIN HFA) 108 (90 Base) MCG/ACT inhaler, Inhale 2 puffs into the lungs every 4 (four) hours as needed for wheezing or shortness of breath., Disp: 1 Inhaler, Rfl: 3 .  Doxylamine-Pyridoxine (DICLEGIS) 10-10 MG TBEC, Take 2 tablets by mouth at bedtime. If symptoms persist, add one tablet in  the morning and one in the afternoon, Disp: 100 tablet, Rfl: 3 .  Prenat w/o A Vit-FeFum-FePo-FA (PROVIDA OB) 20-20-1.25 MG CAPS, , Disp: , Rfl:  .  sertraline (ZOLOFT) 50 MG tablet, TAKE 1 TABLET BY MOUTH EVERY DAY, Disp: 30 tablet, Rfl: 5 .  ondansetron (ZOFRAN ODT) 4 MG disintegrating tablet, Take 1 tablet (4 mg total) by mouth every 6 (six) hours as needed for nausea., Disp: 20 tablet, Rfl: 0   Physical Exam:   BP (!) 92/50   Wt 110 lb (49.9 kg)   LMP 09/27/2018 (Exact Date)   BMI 20.78 kg/m  Body mass index is 20.78 kg/m. Constitutional: Well nourished, well developed female in no acute distress.  Neck:  Supple, normal appearance, and no thyromegaly  Cardiovascular: S1, S2 normal, no murmur, rub or gallop, regular rate and rhythm Respiratory:  Clear to auscultation bilateral. Normal respiratory effort Abdomen: positive bowel sounds and no masses, hernias; diffusely non tender to palpation, non distended Breasts: breasts appear normal, no suspicious masses, no skin or nipple changes or axillary nodes. Neuro/Psych:  Normal mood and affect.  Skin:  Warm and dry.  Lymphatic:  No inguinal lymphadenopathy.   Pelvic exam: is not limited by body habitus EGBUS: within normal limits, Vagina: within normal limits and with no blood in the vault, Cervix: normal appearing cervix without discharge or lesions, closed/long/high, Uterus:  enlarged: 6 weeks, anteverted, and Adnexa:  no mass, fullness, tenderness  Assessment: Jeanette Miranda is a 28 y.o. G2P1001 [redacted]w[redacted]d based on Patient's last menstrual period was 09/27/2018 (exact date). with an Estimated Date of Delivery: 07/04/19,  for prenatal care.  Plan:  1) Avoid alcoholic beverages. 2) Patient encouraged not to smoke.  3) Discontinue the use of all non-medicinal drugs and chemicals.  4) Take prenatal vitamins daily.  5) Seatbelt use advised 6) Nutrition, food safety (fish, cheese advisories, and high nitrite foods) and exercise discussed. 7)  Hospital and practice style delivering at [facility] discussed  8) Patient is asked about travel to areas at risk for the [place] virus, and counseled to avoid travel and exposure to mosquitoes or sexual partners who may have themselves been exposed to the virus. Testing is discussed, and will be ordered as appropriate.  9) Childbirth classes at [facility] advised 10) Genetic Screening, such as with 1st Trimester Screening, cell free fetal DNA, AFP testing, and Ultrasound, as well as with amniocentesis and CVS as appropriate, is discussed with patient. She plans to have genetic  testing this pregnancy. 11) [place] in 10 days 12) Zofran and Diclegis for nausea 13) Cont Zoloft; pros and cons discussed  Problem list reviewed and updated.  Barnett Applebaum, MD, [facility], [facility] 11/06/2018  2:56 PM

## 2018-11-07 LAB — RPR+RH+ABO+RUB AB+AB SCR+CB...
Antibody Screen: NEGATIVE
HIV Screen 4th Generation wRfx: NONREACTIVE
Hematocrit: 40.6 % (ref 34.0–46.6)
Hemoglobin: 14.1 g/dL (ref 11.1–15.9)
Hepatitis B Surface Ag: NEGATIVE
MCH: 30.3 pg (ref 26.6–33.0)
MCHC: 34.7 g/dL (ref 31.5–35.7)
MCV: 87 fL (ref 79–97)
Platelets: 204 10*3/uL (ref 150–450)
RBC: 4.66 x10E6/uL (ref 3.77–5.28)
RDW: 12.2 % (ref 11.7–15.4)
RPR Ser Ql: NONREACTIVE
Rh Factor: POSITIVE
Rubella Antibodies, IGG: 3.25 index (ref >0.99)
Varicella zoster IgG: 277 index (ref >165)
WBC: 7.9 10*3/uL (ref 3.4–10.8)

## 2018-11-08 LAB — URINE CULTURE

## 2018-11-09 ENCOUNTER — Other Ambulatory Visit: Payer: Self-pay

## 2018-11-09 ENCOUNTER — Telehealth: Payer: Self-pay

## 2018-11-09 ENCOUNTER — Other Ambulatory Visit: Payer: Self-pay | Admitting: Advanced Practice Midwife

## 2018-11-09 ENCOUNTER — Emergency Department
Admission: EM | Admit: 2018-11-09 | Discharge: 2018-11-09 | Disposition: A | Discharge status: Home / Self Care | Payer: Medicaid Other | Attending: Student | Admitting: Student

## 2018-11-09 DIAGNOSIS — Z3A01 Less than 8 weeks gestation of pregnancy: Secondary | ICD-10-CM | POA: Insufficient documentation

## 2018-11-09 DIAGNOSIS — J45909 Unspecified asthma, uncomplicated: Secondary | ICD-10-CM | POA: Insufficient documentation

## 2018-11-09 DIAGNOSIS — O210 Mild hyperemesis gravidarum: Secondary | ICD-10-CM

## 2018-11-09 DIAGNOSIS — O219 Vomiting of pregnancy, unspecified: Secondary | ICD-10-CM | POA: Diagnosis present

## 2018-11-09 DIAGNOSIS — R42 Dizziness and giddiness: Secondary | ICD-10-CM | POA: Diagnosis not present

## 2018-11-09 LAB — CBC
HCT: 43.2 % (ref 36.0–46.0)
Hemoglobin: 15 g/dL (ref 12.0–15.0)
MCH: 30 pg (ref 26.0–34.0)
MCHC: 34.7 g/dL (ref 30.0–36.0)
MCV: 86.4 fL (ref 80.0–100.0)
Platelets: 191 10*3/uL (ref 150–400)
RBC: 5 MIL/uL (ref 3.87–5.11)
RDW: 11.9 % (ref 11.5–15.5)
WBC: 7.5 10*3/uL (ref 4.0–10.5)
nRBC: 0 % (ref 0.0–0.2)

## 2018-11-09 LAB — COMPREHENSIVE METABOLIC PANEL
ALT: 17 U/L (ref 0–44)
AST: 18 U/L (ref 15–41)
Albumin: 4.7 g/dL (ref 3.5–5.0)
Alkaline Phosphatase: 46 U/L (ref 38–126)
Anion gap: 8 (ref 5–15)
BUN: 14 mg/dL (ref 6–20)
CO2: 25 mmol/L (ref 22–32)
Calcium: 9.7 mg/dL (ref 8.9–10.3)
Chloride: 102 mmol/L (ref 98–111)
Creatinine, Ser: 0.49 mg/dL (ref 0.44–1.00)
GFR calc Af Amer: >60 mL/min (ref >60)
GFR calc non Af Amer: >60 mL/min (ref >60)
Glucose, Bld: 90 mg/dL (ref 70–99)
Potassium: 3.7 mmol/L (ref 3.5–5.1)
Sodium: 135 mmol/L (ref 135–145)
Total Bilirubin: 1 mg/dL (ref 0.3–1.2)
Total Protein: 7.2 g/dL (ref 6.5–8.1)

## 2018-11-09 LAB — URINALYSIS, COMPLETE (UACMP) WITH MICROSCOPIC
Bacteria, UA: NONE SEEN
Bilirubin Urine: NEGATIVE
Glucose, UA: NEGATIVE mg/dL
Hgb urine dipstick: NEGATIVE
Ketones, ur: 20 mg/dL — AB
Nitrite: NEGATIVE
Protein, ur: 30 mg/dL — AB
Specific Gravity, Urine: 1.027 (ref 1.005–1.030)
pH: 5 (ref 5.0–8.0)

## 2018-11-09 LAB — POCT PREGNANCY, URINE: Preg Test, Ur: POSITIVE — AB

## 2018-11-09 LAB — TSH: TSH: 0.6 u[IU]/mL (ref 0.350–4.500)

## 2018-11-09 MED ORDER — SODIUM CHLORIDE 0.9 % IV BOLUS
1000.0000 mL | Freq: Once | INTRAVENOUS | Status: AC
  Administered 2018-11-09: 1000 mL via INTRAVENOUS

## 2018-11-09 MED ORDER — PROMETHAZINE HCL 25 MG PO TABS: 25.0000 mg | ORAL_TABLET | Freq: Four times a day (QID) | ORAL | 0 refills | Status: DC | PRN

## 2018-11-09 MED ORDER — PROMETHAZINE HCL 25 MG/ML IJ SOLN
25.0000 mg | Freq: Once | INTRAMUSCULAR | Status: AC
  Administered 2018-11-09: 25 mg via INTRAVENOUS
  Filled 2018-11-09: qty 1

## 2018-11-09 MED ORDER — ONDANSETRON HCL 4 MG/2ML IJ SOLN
4.0000 mg | Freq: Once | INTRAMUSCULAR | Status: AC | PRN
  Administered 2018-11-09: 4 mg via INTRAVENOUS
  Filled 2018-11-09: qty 2

## 2018-11-09 MED ORDER — PROMETHAZINE HCL 12.5 MG RE SUPP: 12.5000 mg | Freq: Four times a day (QID) | RECTAL | 0 refills | Status: DC | PRN

## 2018-11-09 MED ORDER — SODIUM CHLORIDE 0.9% FLUSH: 3.0000 mL | Freq: Once | INTRAVENOUS | Status: DC

## 2018-11-09 NOTE — ED Provider Notes (Signed)
[facility] Emergency Department Provider Note  ____________________________________________   First MD Initiated Contact with Patient 11/09/18 1313     (approximate)  I have reviewed the triage vital signs and the nursing notes.   HISTORY  Chief Complaint Nausea and Emesis    HPI Jeanette Miranda is a 28 y.o. female presents emergency department with nausea and vomiting.  Patient [redacted] weeks pregnant has had difficulty keeping any fluids down.  She states her first pregnancy she had a lot of nausea and vomiting but this is worse.  She began to feel little dizzy today.  She is taken Duragesic and Zofran without any relief.  She was given Zofran in triage via IV and states it has helped with the nausea.  Fluids being run at this time.    Past Medical History:  Diagnosis Date  . Asthma   . Depression     Patient Active Problem List   Diagnosis Date Noted  . Endometriosis 08/26/2018  . LLQ pain 12/18/2017  . Mastalgia 04/01/2017  . Breast mass in female 03/07/2017    Past Surgical History:  Procedure Laterality Date  . BREAST BIOPSY    . TONSILLECTOMY    . WISDOM TOOTH EXTRACTION Bilateral     Prior to Admission medications   Medication Sig Start Date End Date Taking? Authorizing Provider  albuterol (VENTOLIN HFA) 108 (90 Base) MCG/ACT inhaler Inhale 2 puffs into the lungs every 4 (four) hours as needed for wheezing or shortness of breath. 03/07/17   Gae Dry, MD  Doxylamine-Pyridoxine (DICLEGIS) 10-10 MG TBEC Take 2 tablets by mouth at bedtime. If symptoms persist, add one tablet in the morning and one in the afternoon 11/03/18   Rexene Agent, CNM  ondansetron (ZOFRAN ODT) 4 MG disintegrating tablet Take 1 tablet (4 mg total) by mouth every 6 (six) hours as needed for nausea. 11/06/18   Gae Dry, MD  Prenat w/o A Vit-FeFum-FePo-FA (PROVIDA OB) 20-20-1.25 MG CAPS     [provider]  promethazine (PHENERGAN) 12.5 MG  suppository Place 1 suppository (12.5 mg total) rectally every 6 (six) hours as needed for nausea or vomiting. If you cannot tolerate oral phenergan 11/09/18   Caryn Section, Linden Dolin, PA-C  promethazine (PHENERGAN) 25 MG tablet Take 1 tablet (25 mg total) by mouth every 6 (six) hours as needed for nausea or vomiting. 11/09/18   Caryn Section Linden Dolin, PA-C  sertraline (ZOLOFT) 50 MG tablet TAKE 1 TABLET BY MOUTH EVERY DAY 02/25/18   Gae Dry, MD    Allergies Patient has no known allergies.  Family History  Problem Relation Age of Onset  . Crohn's disease Mother     Social History Social History   Tobacco Use  . Smoking status: Never Smoker  . Smokeless tobacco: Never Used  Substance Use Topics  . Alcohol use: No  . Drug use: No    Review of Systems  Constitutional: No fever/chills Eyes: No visual changes. ENT: No sore throat. Respiratory: Denies cough Gastrointestinal: Positive for vomiting secondary to early pregnancy Genitourinary: Negative for dysuria. Musculoskeletal: Negative for back pain. Skin: Negative for rash.    ____________________________________________   PHYSICAL EXAM:  VITAL SIGNS: ED Triage Vitals [11/09/18 1250]  Enc Vitals Group     BP 97/60     Pulse Rate 88     Resp 18     Temp 98.6 F (37 C)     Temp Source Oral     SpO2  98 %     Weight 110 lb (49.9 kg)     Height 5\' 1"  (1.549 m)     Head Circumference      Peak Flow      Pain Score 0     Pain Loc      Pain Edu?      Excl. in [place]?     Constitutional: Alert and oriented. Well appearing and in no acute distress.  Patient does appear to not feel well Eyes: Conjunctivae are normal.  Head: Atraumatic. Nose: No congestion/rhinnorhea. Mouth/Throat: Mucous membranes are moist.   Neck:  supple no lymphadenopathy noted Cardiovascular: Normal rate, regular rhythm. Heart sounds are normal Respiratory: Normal respiratory effort.  No retractions, lungs c t a  Abd: soft nontender bs normal all 4  quad GU: deferred Musculoskeletal: FROM all extremities, warm and well perfused Neurologic:  Normal speech and language.  Skin:  Skin is warm, dry and intact. No rash noted. Psychiatric: Mood and affect are normal. Speech and behavior are normal.  ____________________________________________   LABS (all labs ordered are listed, but only abnormal results are displayed)  Labs Reviewed  URINALYSIS, COMPLETE (UACMP) WITH MICROSCOPIC - Abnormal; Notable for the following components:      Result Value   Color, Urine YELLOW (*)    APPearance HAZY (*)    Ketones, ur 20 (*)    Protein, ur 30 (*)    Leukocytes,Ua SMALL (*)    All other components within normal limits  POCT PREGNANCY, URINE - Abnormal; Notable for the following components:   Preg Test, Ur POSITIVE (*)    All other components within normal limits  URINE CULTURE  COMPREHENSIVE METABOLIC PANEL  CBC  TSH  POC URINE PREG, ED   ____________________________________________   ____________________________________________  RADIOLOGY    ____________________________________________   PROCEDURES  Procedure(s) performed: Saline lock, normal saline 1 L IV, Zofran 4 mg IV   Procedures    ____________________________________________   INITIAL IMPRESSION / ASSESSMENT AND PLAN / ED COURSE  Pertinent labs & imaging results that were available during my care of the patient were reviewed by me and considered in my medical decision making (see chart for details).   Patient is 28 year old female presents emergency department with hyperemesis secondary to early pregnancy.  Physical exam shows patient to appear pale but her vitals are normal at this time.  Remainder of exam is unremarkable  POC pregnancy is positive, CBC is normal, comprehensive metabolic panel is normal, urinalysis shows 20 ketones and small amount leuks but no bacteria.  We will add a urine culture ensure there is no UTI at this time.  Patient was given  normal saline 1 L IV, Zofran 4 mg IV.  I anticipate she will be discharged in stable condition.  Of most likely will give her a second liter of fluid prior to discharge.  Patient continued to have some nausea.  She was given Phenergan through the IV.  She was given 2 L of normal saline.  After the Phenergan the patient's nausea had resolved.  She was discharged with a prescription for Phenergan p.o.  Explained her she could cut this in half and see if it helps with the nausea without making her drowsy.  Also she was given a prescription for Phenergan suppositories to use if she is unable to retain fluids.  Explained to her that if she is actively vomiting and most medications will not get into her system.  That it would be best  if she uses a suppository.  She states she understands will comply.  She is to follow-up with Dr. Kenton Kingfisher who is her OB/GYN.  She was discharged in stable condition.   LIVA SCHAAL was evaluated in Emergency Department on 11/09/2018 for the symptoms described in the history of present illness. She was evaluated in the context of the global COVID-19 pandemic, which necessitated consideration that the patient might be at risk for infection with the SARS-CoV-2 virus that causes COVID-19. Institutional protocols and algorithms that pertain to the evaluation of patients at risk for COVID-19 are in a state of rapid change based on information released by regulatory bodies including the CDC and federal and state organizations. These policies and algorithms were followed during the patient's care in the ED.   As part of my medical decision making, I reviewed the following data within the [facility] notes reviewed and incorporated, Labs reviewed see above, Old chart reviewed, Notes from prior ED visits and [place] Controlled Substance Database  ____________________________________________   FINAL CLINICAL IMPRESSION(S) / ED DIAGNOSES  Final diagnoses:  Hyperemesis  arising during pregnancy      NEW MEDICATIONS STARTED DURING THIS VISIT:  Discharge Medication List as of 11/09/2018  4:12 PM    START taking these medications   Details  promethazine (PHENERGAN) 25 MG tablet Take 1 tablet (25 mg total) by mouth every 6 (six) hours as needed for nausea or vomiting., Starting Mon 11/09/2018, Normal         Note:  This document was prepared using Dragon voice recognition software and may include unintentional dictation errors.    Versie Starks, PA-C 11/09/18 1729    Lilia Pro., MD 11/09/18 Tresa Moore

## 2018-11-09 NOTE — ED Notes (Signed)
Vomiting in triage 

## 2018-11-09 NOTE — ED Triage Notes (Signed)
Pt here with c/o nausea/vomiting every day for the past week now. States she is [redacted] weeks pregnant with her second pregnancy, first pregnancy she had some vomiting but nothing  like this. Appears pale, states she has been dizzy today.

## 2018-11-09 NOTE — ED Notes (Signed)
Pt states she has been trying to take PO zofran at home but vomiting it up as soon as she puts it in her mouth.

## 2018-11-09 NOTE — Telephone Encounter (Signed)
Pt calling; was rx'd zofran c the diclegis; is even throwing that up.  What to do?  [phone]

## 2018-11-09 NOTE — ED Notes (Signed)
See triage note   States she is 6 weeks preg  And has had n/v for 1 week  Provider in room on arrival

## 2018-11-09 NOTE — Telephone Encounter (Signed)
Left detailed msg.

## 2018-11-09 NOTE — Telephone Encounter (Signed)
I sent Rx for phenergan suppository since she's unable to keep the others down enough to help. If that doesn't work she may need to be admitted for IV hydration and electrolyte repletion. Please let her know to try the phenergan before we consider hospital. Thanks.

## 2018-11-09 NOTE — Progress Notes (Unsigned)
Rx phenergan suppository sent due to continued n/v with diclegis and zofran.

## 2018-11-09 NOTE — Discharge Instructions (Signed)
Follow-up with Dr. Kenton Kingfisher.  If you are worsening please return emergency department.  Try the medication as prescribed.  You can cut your Phenergan tablet in half to see if it does not make you drowsy and you can function throughout your day.

## 2018-11-11 LAB — URINE CULTURE: Culture: NO GROWTH

## 2018-11-11 LAB — CYTOLOGY - PAP: Diagnosis: NEGATIVE

## 2018-11-13 ENCOUNTER — Inpatient Hospital Stay
Admission: AD | Admit: 2018-11-13 | Discharge: 2018-11-16 | DRG: 833 | Disposition: A | Discharge status: Home / Self Care | Payer: Medicaid Other | Attending: Obstetrics and Gynecology | Admitting: Obstetrics and Gynecology

## 2018-11-13 ENCOUNTER — Telehealth: Payer: Self-pay

## 2018-11-13 ENCOUNTER — Inpatient Hospital Stay: Payer: Medicaid Other

## 2018-11-13 DIAGNOSIS — O99519 Diseases of the respiratory system complicating pregnancy, unspecified trimester: Secondary | ICD-10-CM | POA: Diagnosis not present

## 2018-11-13 DIAGNOSIS — O99341 Other mental disorders complicating pregnancy, first trimester: Secondary | ICD-10-CM | POA: Diagnosis present

## 2018-11-13 DIAGNOSIS — Z23 Encounter for immunization: Secondary | ICD-10-CM

## 2018-11-13 DIAGNOSIS — O210 Mild hyperemesis gravidarum: Principal | ICD-10-CM | POA: Diagnosis present

## 2018-11-13 DIAGNOSIS — J4520 Mild intermittent asthma, uncomplicated: Secondary | ICD-10-CM | POA: Diagnosis present

## 2018-11-13 DIAGNOSIS — Z20828 Contact with and (suspected) exposure to other viral communicable diseases: Secondary | ICD-10-CM | POA: Diagnosis present

## 2018-11-13 DIAGNOSIS — J45909 Unspecified asthma, uncomplicated: Secondary | ICD-10-CM | POA: Diagnosis not present

## 2018-11-13 DIAGNOSIS — F329 Major depressive disorder, single episode, unspecified: Secondary | ICD-10-CM | POA: Diagnosis present

## 2018-11-13 DIAGNOSIS — Z3A01 Less than 8 weeks gestation of pregnancy: Secondary | ICD-10-CM | POA: Diagnosis not present

## 2018-11-13 DIAGNOSIS — F32A Depression, unspecified: Secondary | ICD-10-CM | POA: Diagnosis not present

## 2018-11-13 DIAGNOSIS — Z3490 Encounter for supervision of normal pregnancy, unspecified, unspecified trimester: Secondary | ICD-10-CM

## 2018-11-13 DIAGNOSIS — R1110 Vomiting, unspecified: Secondary | ICD-10-CM | POA: Diagnosis present

## 2018-11-13 DIAGNOSIS — O99511 Diseases of the respiratory system complicating pregnancy, first trimester: Secondary | ICD-10-CM | POA: Diagnosis present

## 2018-11-13 DIAGNOSIS — F419 Anxiety disorder, unspecified: Secondary | ICD-10-CM | POA: Diagnosis not present

## 2018-11-13 LAB — COMPREHENSIVE METABOLIC PANEL
ALT: 15 U/L (ref 0–44)
AST: 16 U/L (ref 15–41)
Albumin: 4.2 g/dL (ref 3.5–5.0)
Alkaline Phosphatase: 36 U/L — ABNORMAL LOW (ref 38–126)
Anion gap: 9 (ref 5–15)
BUN: 11 mg/dL (ref 6–20)
CO2: 24 mmol/L (ref 22–32)
Calcium: 9.6 mg/dL (ref 8.9–10.3)
Chloride: 102 mmol/L (ref 98–111)
Creatinine, Ser: 0.4 mg/dL — ABNORMAL LOW (ref 0.44–1.00)
GFR calc Af Amer: >60 mL/min (ref >60)
GFR calc non Af Amer: >60 mL/min (ref >60)
Glucose, Bld: 76 mg/dL (ref 70–99)
Potassium: 3.7 mmol/L (ref 3.5–5.1)
Sodium: 135 mmol/L (ref 135–145)
Total Bilirubin: 1 mg/dL (ref 0.3–1.2)
Total Protein: 6.9 g/dL (ref 6.5–8.1)

## 2018-11-13 LAB — URINALYSIS, COMPLETE (UACMP) WITH MICROSCOPIC
Bilirubin Urine: NEGATIVE
Glucose, UA: NEGATIVE mg/dL
Hgb urine dipstick: NEGATIVE
Ketones, ur: 80 mg/dL — AB
Nitrite: NEGATIVE
Protein, ur: 30 mg/dL — AB
Specific Gravity, Urine: 1.026 (ref 1.005–1.030)
pH: 5 (ref 5.0–8.0)

## 2018-11-13 MED ORDER — ACETAMINOPHEN 325 MG PO TABS
ORAL_TABLET | ORAL | Status: AC
  Filled 2018-11-13: qty 2

## 2018-11-13 MED ORDER — SERTRALINE HCL 25 MG PO TABS
50.0000 mg | ORAL_TABLET | Freq: Every morning | ORAL | Status: DC
  Administered 2018-11-14 – 2018-11-16 (×3): 50 mg via ORAL
  Filled 2018-11-13 (×3): qty 2

## 2018-11-13 MED ORDER — ACETAMINOPHEN 325 MG PO TABS
650.0000 mg | ORAL_TABLET | Freq: Once | ORAL | Status: AC
  Administered 2018-11-13: 650 mg via ORAL

## 2018-11-13 MED ORDER — LACTATED RINGERS IV SOLN
INTRAVENOUS | Status: DC
  Administered 2018-11-14 (×2): via INTRAVENOUS

## 2018-11-13 MED ORDER — LACTATED RINGERS IV BOLUS
1000.0000 mL | Freq: Once | INTRAVENOUS | Status: AC
  Administered 2018-11-13: 1000 mL via INTRAVENOUS

## 2018-11-13 MED ORDER — SODIUM CHLORIDE 0.9% FLUSH
10.0000 mL | Freq: Two times a day (BID) | INTRAVENOUS | Status: DC
  Administered 2018-11-13 – 2018-11-14 (×2): 10 mL

## 2018-11-13 MED ORDER — SODIUM CHLORIDE 0.9% FLUSH: 10.0000 mL | INTRAVENOUS | Status: DC | PRN

## 2018-11-13 MED ORDER — THIAMINE HCL 100 MG/ML IJ SOLN
Freq: Once | INTRAVENOUS | Status: AC
  Administered 2018-11-13: 22:00:00 via INTRAVENOUS
  Filled 2018-11-13: qty 1000

## 2018-11-13 MED ORDER — PANTOPRAZOLE SODIUM 40 MG PO TBEC
40.0000 mg | DELAYED_RELEASE_TABLET | Freq: Every day | ORAL | Status: DC
  Administered 2018-11-13 – 2018-11-16 (×4): 40 mg via ORAL
  Filled 2018-11-13 (×4): qty 1

## 2018-11-13 MED ORDER — ONDANSETRON HCL 4 MG/2ML IJ SOLN
4.0000 mg | Freq: Four times a day (QID) | INTRAMUSCULAR | Status: DC | PRN
  Administered 2018-11-13 – 2018-11-14 (×2): 4 mg via INTRAVENOUS
  Filled 2018-11-13 (×2): qty 2

## 2018-11-13 NOTE — Telephone Encounter (Signed)
I spoke with the patient who reports she has tried the phenergan suppository and phenergan PO and it is not doing anything for the nausea. She keeps very little down and feels weak, tired, dizzy and lightheaded. We discussed going to hospital for IV fluids, labs and electrolyte repletion as needed. She is agreeable to direct admit plan. Colleen and mother/baby informed.

## 2018-11-13 NOTE — Telephone Encounter (Signed)
Pt calling; was rx'd phenergan; is still throwing up.  Should she go back to the hosp for fluids or what to do?  [phone]

## 2018-11-13 NOTE — H&P (Addendum)
OB History & Physical   History of Present Illness:  Chief Complaint:  Nausea and vomiting HPI:  Jeanette Miranda is a 28 y.o. G26P1001 female with EDC=07/04/2019 at [redacted]w[redacted]d dated by her LMP.  She just had her initial prenatal visit at [facility] on 11/06/2018. At that time she was prescribed Diclegis and Zofran ODT for nausea and vomiting of pregnancy. Her nausea and vomiting persisted and worsened and she went to the ER 10/12 for IV hydration and IV antiemetics. Her CMP and CBC  was normal at that time. She was sent home on phenergan suppositories and po phenergan. Her nausea and vomiting has not responded to these medications and she is keeping little down and is feeling weak, dizzy and lightheaded.   ROS is specifically negative for diarrhea, fever, abdominal pain, dysuria. ROS is positive for sweating excessively, voiding small amounts and infrequently. Past medical history is remarkable for mild intermittent asthma (last used inhaler 2 months ago), and depression (has been taking Zoloft). She denies problems with hyperemesis with her first pregnancy.  Prenatal care site: Prenatal care at [facility] has been remarkable for  [facility] Prenatal Labs  Dating  Blood type: A/Positive/-- (10/09 1458)   Genetic Screen 1 Screen:    AFP:     Quad:     NIPS: Antibody:Negative (10/09 1458)  Anatomic [place]  Rubella: 3.25 (10/09 1458) Varicella: @VZVIGG @  GTT Early:               Third trimester:  RPR: Non Reactive (10/09 1458)   Rhogam  HBsAg: Negative (10/09 1458)   TDaP vaccine                       Flu Shot: HIV: Non Reactive (10/09 1458)   Baby Food                                GBS:   Contraception  Pap:  CBB     CS/VBAC    Support Person     She has a dating scan scheduled at [facility] 11/17/2018    OB History  Gravida Para Term Preterm AB Living  2 1 1     1   SAB TAB Ectopic Multiple Live Births        0 1    # Outcome Date GA Lbr Len/2nd Weight Sex Delivery Anes PTL Lv  2 Current            1 Term 07/10/14 [redacted]w[redacted]d 15:40 / 00:53 3140 g M Vag-Spont EPI  LIV    Obstetric Comments  1st Menstrual Cycle:  13  1st Pregnancy:  23    Maternal Medical History:   Past Medical History:  Diagnosis Date  . Asthma   . Depression     Past Surgical History:  Procedure Laterality Date  . BREAST BIOPSY    . TONSILLECTOMY    . WISDOM TOOTH EXTRACTION Bilateral     No Known Allergies  Prior to Admission medications   Medication Sig Start Date End Date Taking? Authorizing Provider  albuterol (VENTOLIN HFA) 108 (90 Base) MCG/ACT inhaler Inhale 2 puffs into the lungs every 4 (four) hours as needed for wheezing or shortness of breath. 03/07/17   Gae Dry, MD  Doxylamine-Pyridoxine (DICLEGIS) 10-10 MG TBEC Take 2 tablets by mouth at bedtime. If symptoms persist, add one tablet in the morning and one in the afternoon  11/03/18   Rexene Agent, CNM  ondansetron (ZOFRAN ODT) 4 MG disintegrating tablet Take 1 tablet (4 mg total) by mouth every 6 (six) hours as needed for nausea. 11/06/18   Gae Dry, MD  Prenat w/o A Vit-FeFum-FePo-FA (PROVIDA OB) 20-20-1.25 MG CAPS     [provider]  promethazine (PHENERGAN) 12.5 MG suppository Place 1 suppository (12.5 mg total) rectally every 6 (six) hours as needed for nausea or vomiting. If you cannot tolerate oral phenergan 11/09/18   Caryn Section, Linden Dolin, PA-C  promethazine (PHENERGAN) 25 MG tablet Take 1 tablet (25 mg total) by mouth every 6 (six) hours as needed for nausea or vomiting. 11/09/18   Caryn Section Linden Dolin, PA-C  sertraline (ZOLOFT) 50 MG tablet TAKE 1 TABLET BY MOUTH EVERY DAY 02/25/18   Gae Dry, MD     Social History: She  reports that she has never smoked. She has never used smokeless tobacco. She reports that she does not drink alcohol or use drugs.  Family History: family history includes Crohn's disease in her mother.   Review of Systems: Negative x 10 systems reviewed except as noted in the HPI.       Physical Exam:  Vital Signs: BP 99/77 (BP Location: Left Arm)   Pulse 65   Temp 99 F (37.2 C) (Oral)   LMP 09/27/2018 (Exact Date)   SpO2 100% Comment: Room Air General: WF in no acute distress.  HEENT: normocephalic, atraumatic  Mouth: Mucous membranes dry Heart: regular rate & rhythm.  No murmurs/rubs/gallops Lungs: clear to auscultation bilaterally Abdomen: soft, normal bowel sounds, transient RLQ pain with palpation Extremities: non-tender, symmetric, no edema bilaterally.   Neurologic: Alert & oriented x 3.    Labs:  Results for orders placed or performed during the hospital encounter of 11/13/18 (from the past 24 hour(s))  Urinalysis, Complete w Microscopic     Status: Abnormal   Collection Time: 11/13/18  3:43 PM  Result Value Ref Range   Color, Urine YELLOW (A) YELLOW   APPearance CLOUDY (A) CLEAR   Specific Gravity, Urine 1.026 1.005 - 1.030   pH 5.0 5.0 - 8.0   Glucose, UA NEGATIVE NEGATIVE mg/dL   Hgb urine dipstick NEGATIVE NEGATIVE   Bilirubin Urine NEGATIVE NEGATIVE   Ketones, ur 80 (A) NEGATIVE mg/dL   Protein, ur 30 (A) NEGATIVE mg/dL   Nitrite NEGATIVE NEGATIVE   Leukocytes,Ua SMALL (A) NEGATIVE   RBC / HPF 0-5 0 - 5 RBC/hpf   WBC, UA 6-10 0 - 5 WBC/hpf   Bacteria, UA RARE (A) NONE SEEN   Squamous Epithelial / LPF 11-20 0 - 5   Mucus PRESENT    Ca Oxalate Crys, UA PRESENT   Comprehensive metabolic panel     Status: Abnormal   Collection Time: 11/13/18  3:55 PM  Result Value Ref Range   Sodium 135 135 - 145 mmol/L   Potassium 3.7 3.5 - 5.1 mmol/L   Chloride 102 98 - 111 mmol/L   CO2 24 22 - 32 mmol/L   Glucose, Bld 76 70 - 99 mg/dL   BUN 11 6 - 20 mg/dL   Creatinine, Ser 0.40 (L) 0.44 - 1.00 mg/dL   Calcium 9.6 8.9 - 10.3 mg/dL   Total Protein 6.9 6.5 - 8.1 g/dL   Albumin 4.2 3.5 - 5.0 g/dL   AST 16 15 - 41 U/L   ALT 15 0 - 44 U/L   Alkaline Phosphatase 36 (L) 38 - 126 U/L  Total Bilirubin 1.0 0.3 - 1.2 mg/dL   GFR calc non Af Amer >60  >60 mL/min   GFR calc Af Amer >60 >60 mL/min   Anion gap 9 5 - 15    Assessment:  Jeanette Miranda is a 28 y.o. G40P1001 female at [redacted]w[redacted]d with nausea and vomiting of pregnancy  Ketonuria, normal electrolytes, BUN, creatinine, and liver function  Plan:  1. Admit to [facility] unit for IV hydration and IV antiemetics  IV Zofran, 1000 ml LR bolus, then 125 ml/hr Banana bag daily 2. Has been NPO except for ice chips, will start on clear liquids after IV  antiemetics on board 3. Protonix-will try to give po 4. Ultrasound for dating and to rule out twin gestation  Dalia Heading  11/13/2018 4:30 PM

## 2018-11-14 DIAGNOSIS — R1110 Vomiting, unspecified: Secondary | ICD-10-CM

## 2018-11-14 HISTORY — DX: Vomiting, unspecified: R11.10

## 2018-11-14 LAB — NOVEL CORONAVIRUS, NAA (HOSP ORDER, SEND-OUT TO REF LAB; TAT 18-24 HRS): SARS-CoV-2, NAA: NOT DETECTED

## 2018-11-14 MED ORDER — METOCLOPRAMIDE HCL 5 MG/ML IJ SOLN
10.0000 mg | Freq: Four times a day (QID) | INTRAMUSCULAR | Status: DC
  Administered 2018-11-14: 10 mg via INTRAVENOUS
  Filled 2018-11-14: qty 2

## 2018-11-14 MED ORDER — DEXTROSE-NACL 5-0.45 % IV SOLN
INTRAVENOUS | Status: DC
  Administered 2018-11-14 – 2018-11-16 (×6): via INTRAVENOUS

## 2018-11-14 MED ORDER — INFLUENZA VAC SPLIT QUAD 0.5 ML IM SUSY
0.5000 mL | PREFILLED_SYRINGE | INTRAMUSCULAR | Status: AC
  Administered 2018-11-16: 0.5 mL via INTRAMUSCULAR
  Filled 2018-11-14: qty 0.5

## 2018-11-14 MED ORDER — ONDANSETRON HCL 4 MG/2ML IJ SOLN
4.0000 mg | Freq: Four times a day (QID) | INTRAMUSCULAR | Status: DC
  Administered 2018-11-14 – 2018-11-15 (×4): 4 mg via INTRAVENOUS
  Filled 2018-11-14 (×4): qty 2

## 2018-11-14 MED ORDER — THIAMINE HCL 100 MG/ML IJ SOLN
100.0000 mg | INTRAMUSCULAR | Status: DC
  Administered 2018-11-14: 100 mg via INTRAVENOUS
  Filled 2018-11-14 (×2): qty 1

## 2018-11-14 MED ORDER — VITAMIN B-6 50 MG PO TABS
25.0000 mg | ORAL_TABLET | Freq: Three times a day (TID) | ORAL | Status: DC
  Administered 2018-11-14 – 2018-11-16 (×7): 25 mg via ORAL
  Filled 2018-11-14 (×10): qty 0.5

## 2018-11-14 NOTE — Progress Notes (Signed)
Pt called to nurses station @1800  and reports feeling "weird, dizzy, light headed, and hot." Pt vitals taken and VS WNL. Dr. Glennon Mac called and notified of pt complaints and VS. Dr. Glennon Mac d/c'd reglan, and requests to continue to monitor pt. Upon assessment at 1820 Pt states that she feels "less flushed", and just wanted to rest. Will continue to monitor.

## 2018-11-14 NOTE — Progress Notes (Signed)
Daily Antepartum Note  Admission Date: 11/13/2018 Current Date: 11/14/2018 9:50 AM  Jeanette Miranda is a 28 y.o. G2P1001 @ [redacted]w[redacted]d by LMP consistent with 6 week ultrasound, HD#2, admitted for hyperemesis gravidarum.  Pregnancy complicated by: Asthma, hyperemesis, depression   Overnight/24hr events:  No acute events overnight  Subjective:  Still feeling a little "fuzzy." Last emesis yesterday, though has intermittent nausea relieved after a dose of IV zofran.  She is voiding, ambulating. She has tolerated a small amount of clear liquids.   Ultrasound yesterday: single, living IUP with CRL consistent with EDD.  Objective:   Vitals:   11/14/18 0004 11/14/18 0720  BP: (!) 87/52 (!) 91/52  Pulse: 61 67  Resp: 14 14  Temp: 98.6 F (37 C) 98 F (36.7 C)  SpO2: 99% 99%   Temp:  [98 F (36.7 C)-99 F (37.2 C)] 98 F (36.7 C) (10/17 0720) Pulse Rate:  [60-67] 67 (10/17 0720) Resp:  [14] 14 (10/17 0720) BP: (87-103)/(52-77) 91/52 (10/17 0720) SpO2:  [99 %-100 %] 99 % (10/17 0720) Weight:  [48.9 kg] 48.9 kg (10/17 0445) Temp (24hrs), Avg:98.5 F (36.9 C), Min:98 F (36.7 C), Max:99 F (37.2 C)   Intake/Output Summary (Last 24 hours) at 11/14/2018 0950 Last data filed at 11/14/2018 0930 Gross per 24 hour  Intake 653.26 ml  Output 800 ml  Net -146.74 ml     Current Vital Signs 24h Vital Sign Ranges  T 98 F (36.7 C) Temp  Avg: 98.5 F (36.9 C)  Min: 98 F (36.7 C)  Max: 99 F (37.2 C)  BP (!) 91/52 BP  Min: 87/52  Max: 103/72  HR 67 Pulse  Avg: 63.3  Min: 60  Max: 67  RR 14 Resp  Avg: 14  Min: 14  Max: 14  SaO2 99 % Room Air SpO2  Avg: 99.5 %  Min: 99 %  Max: 100 %       24 Hour I/O Current Shift I/O  Time Ins Outs 10/16 0701 - 10/17 0700 In: 653.3 [P.O.:40; I.V.:10] Out: 400 [Urine:400] 10/17 0701 - 10/17 1900 In: -  Out: 400 [Urine:400]   Patient Vitals for the past 24 hrs:  BP Temp Temp src Pulse Resp SpO2 Weight  11/14/18 0720 (!) 91/52 98 F (36.7 C)  Oral 67 14 99 % -  11/14/18 0445 - - - - - - 48.9 kg  11/14/18 0004 (!) 87/52 98.6 F (37 C) Oral 61 14 99 % -  11/13/18 1533 99/77 99 F (37.2 C) Oral 65 - 100 % -  11/13/18 1442 103/72 98.4 F (36.9 C) Oral 60 - 100 % -    Physical exam: General: Well nourished, well developed female in no acute distress. Cardiovascular: normal color of skin. Acyanotic  Respiratory: normal work of breathing Extremities: no clubbing, cyanosis or edema Skin: Warm and dry.   Medications: Current Facility-Administered Medications  Medication Dose Route Frequency Provider Last Rate Last Dose  . acetaminophen (TYLENOL) 325 MG tablet           . [START ON 11/15/2018] influenza vac split quadrivalent PF (FLUARIX) injection 0.5 mL  0.5 mL Intramuscular Tomorrow-1000 Schuman, Christanna R, MD      . lactated ringers infusion   Intravenous Continuous Dalia Heading, CNM 125 mL/hr at 11/14/18 0700    . ondansetron (ZOFRAN) injection 4 mg  4 mg Intravenous Q6H PRN Dalia Heading, CNM   4 mg at 11/14/18 0732  . pantoprazole (PROTONIX) EC tablet 40  mg  40 mg Oral Daily Dalia Heading, CNM   40 mg at 11/13/18 1813  . pyridOXINE (VITAMIN B-6) tablet 25 mg  25 mg Oral TID Will Bonnet, MD      . sertraline (ZOLOFT) tablet 50 mg  50 mg Oral q morning - 10a Dalia Heading, CNM      . sodium chloride flush (NS) 0.9 % injection 10-40 mL  10-40 mL Intracatheter Q12H Schuman, Christanna R, MD   10 mL at 11/13/18 2300  . sodium chloride flush (NS) 0.9 % injection 10-40 mL  10-40 mL Intracatheter PRN Homero Fellers, MD        Labs:  Recent Labs  Lab 11/09/18 1302  WBC 7.5  HGB 15.0  HCT 43.2  PLT 191    Recent Labs  Lab 11/09/18 1302 11/13/18 1555  NA 135 135  K 3.7 3.7  CL 102 102  CO2 25 24  BUN 14 11  CREATININE 0.49 0.40*  CALCIUM 9.7 9.6  PROT 7.2 6.9  BILITOT 1.0 1.0  ALKPHOS 46 36*  ALT 17 15  AST 18 16  GLUCOSE 90 76     Radiology:  US Ob Comp Less 14  Wks  Result Date: 11/13/2018 CLINICAL DATA:  Hyperemesis, first trimester EXAM: OBSTETRIC <14 WK [place] AND TRANSVAGINAL OB US TECHNIQUE: Both transabdominal and transvaginal ultrasound examinations were performed for complete evaluation of the gestation as well as the maternal uterus, adnexal regions, and pelvic cul-de-sac. Transvaginal technique was performed to assess early pregnancy. COMPARISON:  None. FINDINGS: Intrauterine gestational sac: Single Yolk sac:  Visualized. Embryo:  Visualized. Cardiac Activity: For visualized. Heart Rate: 126  bpm CRL:  8.0 mm   6 w   5 d                  [place] EDC: 07/04/2019 Subchorionic hemorrhage:  None visualized. Maternal uterus/adnexae: Within the left ovary is a probable corpus luteum cyst. The right ovary is unremarkable. No free fluid in the cul-de-sac. IMPRESSION: Single live intrauterine pregnancy measuring 6 weeks 5 days. Electronically Signed   By: Prudencio Pair M.D.   On: 11/13/2018 22:59     Assessment & Plan:  28 y.o. G2P1001 female at [redacted]w[redacted]d admitted yesterday with hyperemesis gravidarum, slowly improving  *Pregnancy: PNV when able to tolerate.  * Hyperemesis:   * continue protonix,   * IV zofran: Will switch to oral zofran once tolerate PO with some light solids.    * Add PO Vitamin B6 25mg  tid  * Continue IVF resuscitation. Daily multivitamin bag containing thiamine.  Increase IV rate for another liter.  * Labs normal. No electrolyte abnormalities.  *PPx: Protonix, SCDs *FEN/GI:clears, MIVF after higher rate dose. Will change MIVF to D5 1/2 NS *Dispo: home once tolerating PO diet with or antiemetics. Like 1-2 days.   Prentice Docker, MD, [facility], [facility] 11/14/2018 9:57 AM

## 2018-11-14 NOTE — Progress Notes (Signed)
Reviewed EKG.  QTc 442 ms.  Normal for female in this age range.  Continue to monitor. She has no symptoms. The overall EKG was normal sinus rhythm.   Prentice Docker, MD, [facility], [facility] 11/14/2018 9:27 PM

## 2018-11-15 ENCOUNTER — Encounter: Payer: Self-pay | Admitting: Obstetrics and Gynecology

## 2018-11-15 ENCOUNTER — Other Ambulatory Visit: Payer: Self-pay

## 2018-11-15 MED ORDER — ONDANSETRON HCL 4 MG PO TABS
8.0000 mg | ORAL_TABLET | Freq: Three times a day (TID) | ORAL | Status: DC
  Administered 2018-11-15 – 2018-11-16 (×3): 8 mg via ORAL
  Filled 2018-11-15 (×3): qty 2

## 2018-11-15 NOTE — Progress Notes (Signed)
Pt reports that nausea has improved. Tolerated two packets of crackers well throughout night. Continues liquid PO intake. Up to restroom without assist, denies lightheadness or dizziness.

## 2018-11-15 NOTE — Progress Notes (Signed)
Daily Antepartum Note  Admission Date: 11/13/2018 Current Date: 11/15/2018 9:11 AM  Jeanette Miranda is a 28 y.o. G2P1001 @ [redacted]w[redacted]d by LMP consistent with 6 week ultrasound, HD#3, admitted for hyperemesis gravidarum.  Pregnancy complicated by: Asthma, hyperemesis, depression   Overnight/24hr events:  No acute events overnight  Subjective:  She is feeling "much better" this morning.  She is voiding, ambulating. She has tolerated a small amount of clear liquids and some crackers.  One dose of Reglan made her feel "bad" and not quite right.   Objective:   Vitals:   11/15/18 0354 11/15/18 0805  BP: (!) 92/54 (!) 93/59  Pulse: 62   Resp: 14 20  Temp: 98.2 F (36.8 C) 98 F (36.7 C)  SpO2: 100%    Temp:  [97.8 F (36.6 C)-98.8 F (37.1 C)] 98 F (36.7 C) (10/18 0805) Pulse Rate:  [61-67] 62 (10/18 0354) Resp:  [14-20] 20 (10/18 0805) BP: (92-105)/(52-67) 93/59 (10/18 0805) SpO2:  [98 %-100 %] 100 % (10/18 0354) Weight:  [53.8 kg] 53.8 kg (10/18 0649) Temp (24hrs), Avg:98.3 F (36.8 C), Min:97.8 F (36.6 C), Max:98.8 F (37.1 C)   Intake/Output Summary (Last 24 hours) at 11/15/2018 0911 Last data filed at 11/15/2018 0000 Gross per 24 hour  Intake 3953.81 ml  Output 800 ml  Net 3153.81 ml     Current Vital Signs 24h Vital Sign Ranges  T 98 F (36.7 C) Temp  Avg: 98.3 F (36.8 C)  Min: 97.8 F (36.6 C)  Max: 98.8 F (37.1 C)  BP (!) 93/59 BP  Min: 92/54  Max: 105/67  HR 62 Pulse  Avg: 63.6  Min: 61  Max: 67  RR 20 Resp  Avg: 15.9  Min: 14  Max: 20  SaO2 100 % Room Air SpO2  Avg: 99.3 %  Min: 98 %  Max: 100 %       24 Hour I/O Current Shift I/O  Time Ins Outs 10/17 0701 - 10/18 0700 In: 3953.8 [P.O.:130; I.V.:3823.8] Out: 1000 [Urine:1000] No intake/output data recorded.   Patient Vitals for the past 24 hrs:  BP Temp Temp src Pulse Resp SpO2 Weight  11/15/18 0805 (!) 93/59 98 F (36.7 C) Oral - 20 - -  11/15/18 0649 - - - - - - 53.8 kg  11/15/18 0354 (!)  92/54 98.2 F (36.8 C) Oral 62 14 100 % -  11/14/18 2352 (!) 99/57 98.1 F (36.7 C) Oral 65 14 100 % -  11/14/18 1955 (!) 102/58 98.8 F (37.1 C) Oral 62 16 100 % -  11/14/18 1953 (!) 102/58 98.8 F (37.1 C) Oral 61 - 100 % -  11/14/18 1808 105/67 98.4 F (36.9 C) Oral 67 15 98 % -  11/14/18 1620 (!) 93/59 97.8 F (36.6 C) Oral 63 16 - -  11/14/18 1127 (!) 96/52 98.3 F (36.8 C) Oral 65 16 98 % -    Physical exam: General: Well nourished, well developed female in no acute distress. Cardiovascular: normal color of skin. Acyanotic, RRR Respiratory: normal work of breathing, CTAB Abd: soft, mildly ttp in epigastric area, no other tenderness, +BS Extremities: no clubbing, cyanosis or edema Skin: Warm and dry.   Medications: Current Facility-Administered Medications  Medication Dose Route Frequency Provider Last Rate Last Dose  . dextrose 5 %-0.45 % sodium chloride infusion   Intravenous Continuous Will Bonnet, MD 150 mL/hr at 11/15/18 0846    . influenza vac split quadrivalent PF (FLUARIX) injection 0.5 mL  0.5 mL Intramuscular Tomorrow-1000 Schuman, Christanna R, MD      . ondansetron (ZOFRAN) injection 4 mg  4 mg Intravenous Q6H Will Bonnet, MD   4 mg at 11/15/18 0805  . pantoprazole (PROTONIX) EC tablet 40 mg  40 mg Oral Daily Dalia Heading, CNM   40 mg at 11/14/18 1048  . pyridOXINE (VITAMIN B-6) tablet 25 mg  25 mg Oral TID Will Bonnet, MD   25 mg at 11/14/18 2242  . sertraline (ZOLOFT) tablet 50 mg  50 mg Oral q morning - 10a Dalia Heading, CNM   50 mg at 11/14/18 1048  . sodium chloride flush (NS) 0.9 % injection 10-40 mL  10-40 mL Intracatheter Q12H Schuman, Christanna R, MD   10 mL at 11/14/18 2243  . sodium chloride flush (NS) 0.9 % injection 10-40 mL  10-40 mL Intracatheter PRN Schuman, Christanna R, MD      . thiamine (B-1) injection 100 mg  100 mg Intravenous Q24H Will Bonnet, MD   100 mg at 11/14/18 1412    Labs:  Recent Labs   Lab 11/09/18 1302  WBC 7.5  HGB 15.0  HCT 43.2  PLT 191    Recent Labs  Lab 11/09/18 1302 11/13/18 1555  NA 135 135  K 3.7 3.7  CL 102 102  CO2 25 24  BUN 14 11  CREATININE 0.49 0.40*  CALCIUM 9.7 9.6  PROT 7.2 6.9  BILITOT 1.0 1.0  ALKPHOS 46 36*  ALT 17 15  AST 18 16  GLUCOSE 90 76      Assessment & Plan:  28 y.o. G2P1001 female at [redacted]w[redacted]d HD#3 with hyperemesis gravidarum, improving  *Pregnancy: PNV when able to tolerate.  * Hyperemesis:   * continue protonix - switch to PO if not already,   * IV zofran: Will switch to oral zofran today. She does not do well with the ODT formulation.   * PO Vitamin B6 25mg  tid  * Decrease IVF . Daily thiamine.    * Labs normal. No electrolyte abnormalities.   * Discussed risks of taking zofran in pregnancy including a possible risk of birth defects.  *PPx: Protonix, SCDs *FEN/GI: advance to bland diet.  *Dispo: home once tolerating PO diet with or antiemetics. Perhaps later today or tomorrow  Prentice Docker, MD, [place] 11/15/2018 9:11 AM

## 2018-11-16 MED ORDER — PYRIDOXINE HCL 25 MG PO TABS: 25.0000 mg | ORAL_TABLET | Freq: Three times a day (TID) | ORAL | 3 refills | Status: DC

## 2018-11-16 MED ORDER — PANTOPRAZOLE SODIUM 40 MG PO TBEC: 40.0000 mg | DELAYED_RELEASE_TABLET | Freq: Every day | ORAL | 3 refills | Status: DC

## 2018-11-16 MED ORDER — ONDANSETRON HCL 8 MG PO TABS: 8.0000 mg | ORAL_TABLET | Freq: Three times a day (TID) | ORAL | 3 refills | Status: DC

## 2018-11-16 NOTE — Progress Notes (Signed)
Pt discharged home.  Discharge instructions, prescriptions and follow up appointment given to and reviewed with pt.  Pt verbalized understanding.  Escorted by staff. 

## 2018-11-16 NOTE — Discharge Summary (Signed)
Discharge Summary   Patient ID: Jeanette Miranda [id number] 28 y.o. 08/18/1990  Admit date: 11/13/2018  Discharge date: 11/16/2018  Principal Diagnoses:  Hyperemesis gravidarum  Secondary Diagnoses:  None  Procedures performed during the hospitalization:  EKG with normal results  HPI: Jeanette Miranda was admitted due to inability to keep down food and liquids at home after receiving IV hydration in the ED on 10/12. She had frequent nausea and vomiting which was unrelieved by Phenergan suppositories and PO medication. She was feeling weak, dizzy, and lightheaded. Her pregnancy is complicated by mild intermittent asthma and depression (taking Zoloft).  Past Medical History:  Diagnosis Date  . Asthma   . Depression     Past Surgical History:  Procedure Laterality Date  . BREAST BIOPSY    . TONSILLECTOMY    . WISDOM TOOTH EXTRACTION Bilateral     No Known Allergies  Social History   Tobacco Use  . Smoking status: Never Smoker  . Smokeless tobacco: Never Used  Substance Use Topics  . Alcohol use: No  . Drug use: No    Family History  Problem Relation Age of Onset  . Crohn's disease Mother     Hospital Course: Over the course of her stay, Jeanette Miranda received IV fluids with vitamins, IV hydration, antacids, and antiemetics. She was able to advance her diet from clear fluids to bland food to a regular diet. Today, she is tolerating food and liquids and has no nausea or emesis at this time. She is able to ambulate without dizziness, and is feeling better. She is voiding without difficulty and has normal urine output.  Discharge Exam: BP (!) 98/57 (BP Location: Left Arm)   Pulse 62   Temp 98.4 F (36.9 C) (Oral)   Resp 18   Wt 49.4 kg   LMP 09/27/2018 (Exact Date)   SpO2 99%   BMI 20.56 kg/m  Physical Exam Constitutional:      General: She is not in acute distress.    Appearance: Normal appearance. She is not ill-appearing.  Cardiovascular:     Rate and Rhythm:  Normal rate.  Pulmonary:     Effort: Pulmonary effort is normal.  Neurological:     Mental Status: She is alert and oriented to person, place, and time.  Psychiatric:        Mood and Affect: Mood normal.        Behavior: Behavior normal.     Condition at Discharge: Stable  Complications affecting treatment: None  Discharge Medications:  Allergies as of 11/16/2018   No Known Allergies     Medication List    STOP taking these medications   Doxylamine-Pyridoxine 10-10 MG Tbec Commonly known as: Diclegis   ondansetron 4 MG disintegrating tablet Commonly known as: Zofran ODT   promethazine 12.5 MG suppository Commonly known as: PHENERGAN   promethazine 25 MG tablet Commonly known as: PHENERGAN     TAKE these medications   albuterol 108 (90 Base) MCG/ACT inhaler Commonly known as: Ventolin HFA Inhale 2 puffs into the lungs every 4 (four) hours as needed for wheezing or shortness of breath.   ondansetron 8 MG tablet Commonly known as: ZOFRAN Take 1 tablet (8 mg total) by mouth every 8 (eight) hours.   pantoprazole 40 MG tablet Commonly known as: PROTONIX Take 1 tablet (40 mg total) by mouth daily.   Provida OB 20-20-1.25 MG Caps   pyridOXINE 25 MG tablet Commonly known as: VITAMIN B-6 Take 1 tablet (25 mg total) by mouth  3 (three) times daily.   sertraline 50 MG tablet Commonly known as: ZOLOFT TAKE 1 TABLET BY MOUTH EVERY DAY       Follow-up arrangements: ROB scheduled for tomorrow, 10/20   Discharge Disposition: Discharge disposition: 01-Home or Self Care    Signed: Rexene Agent, CNM  11/16/2018 9:32 AM

## 2018-11-17 ENCOUNTER — Other Ambulatory Visit: Payer: Self-pay | Admitting: Obstetrics & Gynecology

## 2018-11-17 ENCOUNTER — Other Ambulatory Visit: Payer: Self-pay

## 2018-11-17 ENCOUNTER — Ambulatory Visit (INDEPENDENT_AMBULATORY_CARE_PROVIDER_SITE_OTHER): Payer: Medicaid Other | Admitting: Advanced Practice Midwife

## 2018-11-17 ENCOUNTER — Encounter: Payer: Self-pay | Admitting: Advanced Practice Midwife

## 2018-11-17 ENCOUNTER — Ambulatory Visit (INDEPENDENT_AMBULATORY_CARE_PROVIDER_SITE_OTHER): Payer: Medicaid Other

## 2018-11-17 VITALS — BP 100/62 | Wt 107.0 lb

## 2018-11-17 DIAGNOSIS — O3481 Maternal care for other abnormalities of pelvic organs, first trimester: Secondary | ICD-10-CM

## 2018-11-17 DIAGNOSIS — Z3A01 Less than 8 weeks gestation of pregnancy: Secondary | ICD-10-CM | POA: Diagnosis not present

## 2018-11-17 DIAGNOSIS — N912 Amenorrhea, unspecified: Secondary | ICD-10-CM

## 2018-11-17 DIAGNOSIS — Z3481 Encounter for supervision of other normal pregnancy, first trimester: Secondary | ICD-10-CM

## 2018-11-17 DIAGNOSIS — N8312 Corpus luteum cyst of left ovary: Secondary | ICD-10-CM

## 2018-11-17 DIAGNOSIS — Z3490 Encounter for supervision of normal pregnancy, unspecified, unspecified trimester: Secondary | ICD-10-CM

## 2018-11-17 NOTE — Progress Notes (Signed)
No vb. No lof. U/s today.  

## 2018-11-17 NOTE — Progress Notes (Signed)
  Routine Prenatal Care Visit  Subjective  Jeanette Miranda is a 28 y.o. G2P1001 at [redacted]w[redacted]d being seen today for ongoing prenatal care.  She is currently monitored for the following issues for this low-risk pregnancy and has Breast mass in female; Mastalgia; LLQ pain; Endometriosis; Supervision of low-risk pregnancy; Asthma affecting pregnancy, antepartum; Depression; and Hyperemesis on their problem list.  ----------------------------------------------------------------------------------- Patient reports feeling much better and is able to keep food down. She has been constipated.    . Vag. Bleeding: None.   . Leaking Fluid denies.  ----------------------------------------------------------------------------------- The following portions of the patient's history were reviewed and updated as appropriate: allergies, current medications, past family history, past medical history, past social history, past surgical history and problem list. Problem list updated.  Objective  Blood pressure 100/62, weight 107 lb (48.5 kg), last menstrual period 09/27/2018, currently breastfeeding. Pregravid weight 110 lb (49.9 kg) Total Weight Gain -3 lb (-1.361 kg) Urinalysis: Urine Protein    Urine Glucose    Fetal Status: Fetal Heart Rate (bpm): 139         General:  Alert, oriented and cooperative. Patient is in no acute distress.  Skin: Skin is warm and dry. No rash noted.   Cardiovascular: Normal heart rate noted  Respiratory: Normal respiratory effort, no problems with respiration noted  Abdomen: Soft, gravid, appropriate for gestational age. Pain/Pressure: Absent     Pelvic:  Cervical exam deferred        Extremities: Normal range of motion.     Mental Status: Normal mood and affect. Normal behavior. Normal judgment and thought content.   Assessment   28 y.o. G2P1001 at [redacted]w[redacted]d by  07/04/2019, by Last Menstrual Period presenting for routine prenatal visit  Plan   Pregnancy #2 Problems (from 09/27/18 to  present)    No problems associated with this episode.       Preterm labor symptoms and general obstetric precautions including but not limited to vaginal bleeding, contractions, leaking of fluid and fetal movement were reviewed in detail with the patient. Constipation: increase h2o, fiber, and physical activity, safe OTC medications as needed  Return in about 4 weeks (around 12/15/2018) for rob.  Rod Can, CNM 11/17/2018 3:08 PM

## 2018-11-25 ENCOUNTER — Telehealth: Payer: Self-pay

## 2018-11-25 NOTE — Telephone Encounter (Signed)
Spoke w/patient. Advised spotting in early pregnancy can be normal. Monitor for bright red/increased spotting/bleeding. Return call with any new/worsening of symptoms.

## 2018-11-25 NOTE — Telephone Encounter (Signed)
Pt reports spotting brownish/tan Monday night. It stopped yesterday and it started again today and is brownish pink. She is concerned. [id number]

## 2018-12-15 ENCOUNTER — Other Ambulatory Visit: Payer: Self-pay

## 2018-12-15 ENCOUNTER — Ambulatory Visit (INDEPENDENT_AMBULATORY_CARE_PROVIDER_SITE_OTHER): Payer: Medicaid Other | Admitting: Maternal Newborn

## 2018-12-15 ENCOUNTER — Encounter: Payer: Self-pay | Admitting: Maternal Newborn

## 2018-12-15 VITALS — BP 100/50 | Wt 105.0 lb

## 2018-12-15 DIAGNOSIS — J45909 Unspecified asthma, uncomplicated: Secondary | ICD-10-CM

## 2018-12-15 DIAGNOSIS — Z3A11 11 weeks gestation of pregnancy: Secondary | ICD-10-CM

## 2018-12-15 DIAGNOSIS — Z3490 Encounter for supervision of normal pregnancy, unspecified, unspecified trimester: Secondary | ICD-10-CM

## 2018-12-15 DIAGNOSIS — O99511 Diseases of the respiratory system complicating pregnancy, first trimester: Secondary | ICD-10-CM

## 2018-12-15 DIAGNOSIS — Z3491 Encounter for supervision of normal pregnancy, unspecified, first trimester: Secondary | ICD-10-CM

## 2018-12-15 DIAGNOSIS — Z1379 Encounter for other screening for genetic and chromosomal anomalies: Secondary | ICD-10-CM

## 2018-12-15 NOTE — Progress Notes (Signed)
ROB No concerns Desires genetic screening Denies lof, no vb

## 2018-12-15 NOTE — Progress Notes (Signed)
    Routine Prenatal Care Visit  Subjective  Jeanette Miranda is a 28 y.o. G2P1001 at [redacted]w[redacted]d being seen today for ongoing prenatal care.  She is currently monitored for the following issues for this low-risk pregnancy and has Breast mass in female; Mastalgia; LLQ pain; Endometriosis; Supervision of low-risk pregnancy; Asthma affecting pregnancy, antepartum; Depression; and Hyperemesis on their problem list.  ----------------------------------------------------------------------------------- Patient reports that nausea and vomiting are improving. She has an occasional episode of emesis, but is able to keep down most food and liquids.  Vag. Bleeding: None.   ----------------------------------------------------------------------------------- The following portions of the patient's history were reviewed and updated as appropriate: allergies, current medications, past family history, past medical history, past social history, past surgical history and problem list. Problem list updated.   Objective  Blood pressure (!) 100/50, weight 105 lb (47.6 kg), last menstrual period 09/27/2018, currently breastfeeding. Pregravid weight 110 lb (49.9 kg) Total Weight Gain -5 lb (-2.268 kg)  Fetal Status: Fetal Heart Rate (bpm): 167         General:  Alert, oriented and cooperative. Patient is in no acute distress.  Skin: Skin is warm and dry. No rash noted.   Cardiovascular: Normal heart rate noted  Respiratory: Normal respiratory effort, no problems with respiration noted  Abdomen: Soft, gravid, appropriate for gestational age. Pain/Pressure: Absent     Pelvic:  Cervical exam deferred        Extremities: Normal range of motion.     Mental Status: Normal mood and affect. Normal behavior. Normal judgment and thought content.     Assessment   28 y.o. G2P1001 at [redacted]w[redacted]d EDD 07/04/2019, by Last Menstrual Period presenting for a routine prenatal visit.  Plan   Pregnancy #2 Problems (from 09/27/18 to present)     No problems associated with this episode.      MaterniT21 today.  Please refer to After Visit Summary for other counseling recommendations.   Return in about 4 weeks (around 01/12/2019) for ROB.  Avel Sensor, CNM 12/15/2018  1:51 PM

## 2018-12-15 NOTE — Patient Instructions (Signed)
First Trimester of Pregnancy The first trimester of pregnancy is from week 1 until the end of week 13 (months 1 through 3). A week after a sperm fertilizes an egg, the egg will implant on the wall of the uterus. This embryo will begin to develop into a baby. Genes from you and your partner will form the baby. The female genes will determine whether the baby will be a boy or a girl. At 6-8 weeks, the eyes and face will be formed, and the heartbeat can be seen on ultrasound. At the end of 12 weeks, all the baby's organs will be formed. Now that you are pregnant, you will want to do everything you can to have a healthy baby. Two of the most important things are to get good prenatal care and to follow your health care provider's instructions. Prenatal care is all the medical care you receive before the baby's birth. This care will help prevent, find, and treat any problems during the pregnancy and childbirth. Body changes during your first trimester Your body goes through many changes during pregnancy. The changes vary from woman to woman.  You may gain or lose a couple of pounds at first.  You may feel sick to your stomach (nauseous) and you may throw up (vomit). If the vomiting is uncontrollable, call your health care provider.  You may tire easily.  You may develop headaches that can be relieved by medicines. All medicines should be approved by your health care provider.  You may urinate more often. Painful urination may mean you have a bladder infection.  You may develop heartburn as a result of your pregnancy.  You may develop constipation because certain hormones are causing the muscles that push stool through your intestines to slow down.  You may develop hemorrhoids or swollen veins (varicose veins).  Your breasts may begin to grow larger and become tender. Your nipples may stick out more, and the tissue that surrounds them (areola) may become darker.  Your gums may bleed and may be  sensitive to brushing and flossing.  Dark spots or blotches (chloasma, mask of pregnancy) may develop on your face. This will likely fade after the baby is born.  Your menstrual periods will stop.  You may have a loss of appetite.  You may develop cravings for certain kinds of food.  You may have changes in your emotions from day to day, such as being excited to be pregnant or being concerned that something may go wrong with the pregnancy and baby.  You may have more vivid and strange dreams.  You may have changes in your hair. These can include thickening of your hair, rapid growth, and changes in texture. Some women also have hair loss during or after pregnancy, or hair that feels dry or thin. Your hair will most likely return to normal after your baby is born. What to expect at prenatal visits During a routine prenatal visit:  You will be weighed to make sure you and the baby are growing normally.  Your blood pressure will be taken.  Your abdomen will be measured to track your baby's growth.  The fetal heartbeat will be listened to between weeks 10 and 14 of your pregnancy.  Test results from any previous visits will be discussed. Your health care provider may ask you:  How you are feeling.  If you are feeling the baby move.  If you have had any abnormal symptoms, such as leaking fluid, bleeding, severe headaches, or abdominal   cramping.  If you are using any tobacco products, including cigarettes, chewing tobacco, and electronic cigarettes.  If you have any questions. Other tests that may be performed during your first trimester include:  Blood tests to find your blood type and to check for the presence of any previous infections. The tests will also be used to check for low iron levels (anemia) and protein on red blood cells (Rh antibodies). Depending on your risk factors, or if you previously had diabetes during pregnancy, you may have tests to check for high blood sugar  that affects pregnant women (gestational diabetes).  Urine tests to check for infections, diabetes, or protein in the urine.  An ultrasound to confirm the proper growth and development of the baby.  Fetal screens for spinal cord problems (spina bifida) and Down syndrome.  HIV (human immunodeficiency virus) testing. Routine prenatal testing includes screening for HIV, unless you choose not to have this test.  You may need other tests to make sure you and the baby are doing well. Follow these instructions at home: Medicines  Follow your health care provider's instructions regarding medicine use. Specific medicines may be either safe or unsafe to take during pregnancy.  Take a prenatal vitamin that contains at least 600 micrograms (mcg) of folic acid.  If you develop constipation, try taking a stool softener if your health care provider approves. Eating and drinking   Eat a balanced diet that includes fresh fruits and vegetables, whole grains, good sources of protein such as meat, eggs, or tofu, and low-fat dairy. Your health care provider will help you determine the amount of weight gain that is right for you.  Avoid raw meat and uncooked cheese. These carry germs that can cause birth defects in the baby.  Eating four or five small meals rather than three large meals a day may help relieve nausea and vomiting. If you start to feel nauseous, eating a few soda crackers can be helpful. Drinking liquids between meals, instead of during meals, also seems to help ease nausea and vomiting.  Limit foods that are high in fat and processed sugars, such as fried and sweet foods.  To prevent constipation: ? Eat foods that are high in fiber, such as fresh fruits and vegetables, whole grains, and beans. ? Drink enough fluid to keep your urine clear or pale yellow. Activity  Exercise only as directed by your health care provider. Most women can continue their usual exercise routine during  pregnancy. Try to exercise for 30 minutes at least 5 days a week. Exercising will help you: ? Control your weight. ? Stay in shape. ? Be prepared for labor and delivery.  Experiencing pain or cramping in the lower abdomen or lower back is a good sign that you should stop exercising. Check with your health care provider before continuing with normal exercises.  Try to avoid standing for long periods of time. Move your legs often if you must stand in one place for a long time.  Avoid heavy lifting.  Wear low-heeled shoes and practice good posture.  You may continue to have sex unless your health care provider tells you not to. Relieving pain and discomfort  Wear a good support bra to relieve breast tenderness.  Take warm sitz baths to soothe any pain or discomfort caused by hemorrhoids. Use hemorrhoid cream if your health care provider approves.  Rest with your legs elevated if you have leg cramps or low back pain.  If you develop varicose veins in   your legs, wear support hose. Elevate your feet for 15 minutes, 3-4 times a day. Limit salt in your diet. Prenatal care  Schedule your prenatal visits by the twelfth week of pregnancy. They are usually scheduled monthly at first, then more often in the last 2 months before delivery.  Write down your questions. Take them to your prenatal visits.  Keep all your prenatal visits as told by your health care provider. This is important. Safety  Wear your seat belt at all times when driving.  Make a list of emergency phone numbers, including numbers for family, friends, the hospital, and police and fire departments. General instructions  Ask your health care provider for a referral to a local prenatal education class. Begin classes no later than the beginning of month 6 of your pregnancy.  Ask for help if you have counseling or nutritional needs during pregnancy. Your health care provider can offer advice or refer you to specialists for help  with various needs.  Do not use hot tubs, steam rooms, or saunas.  Do not douche or use tampons or scented sanitary pads.  Do not cross your legs for long periods of time.  Avoid cat litter boxes and soil used by cats. These carry germs that can cause birth defects in the baby and possibly loss of the fetus by miscarriage or stillbirth.  Avoid all smoking, herbs, alcohol, and medicines not prescribed by your health care provider. Chemicals in these products affect the formation and growth of the baby.  Do not use any products that contain nicotine or tobacco, such as cigarettes and e-cigarettes. If you need help quitting, ask your health care provider. You may receive counseling support and other resources to help you quit.  Schedule a dentist appointment. At home, brush your teeth with a soft toothbrush and be gentle when you floss. Contact a health care provider if:  You have dizziness.  You have mild pelvic cramps, pelvic pressure, or nagging pain in the abdominal area.  You have persistent nausea, vomiting, or diarrhea.  You have a bad smelling vaginal discharge.  You have pain when you urinate.  You notice increased swelling in your face, hands, legs, or ankles.  You are exposed to fifth disease or chickenpox.  You are exposed to German measles (rubella) and have never had it. Get help right away if:  You have a fever.  You are leaking fluid from your vagina.  You have spotting or bleeding from your vagina.  You have severe abdominal cramping or pain.  You have rapid weight gain or loss.  You vomit blood or material that looks like coffee grounds.  You develop a severe headache.  You have shortness of breath.  You have any kind of trauma, such as from a fall or a car accident. Summary  The first trimester of pregnancy is from week 1 until the end of week 13 (months 1 through 3).  Your body goes through many changes during pregnancy. The changes vary from  woman to woman.  You will have routine prenatal visits. During those visits, your health care provider will examine you, discuss any test results you may have, and talk with you about how you are feeling. This information is not intended to replace advice given to you by your health care provider. Make sure you discuss any questions you have with your health care provider. Document Released: 01/08/2001 Document Revised: 12/27/2016 Document Reviewed: 12/27/2015 [employer]vier Patient Education  2020 [employer]  

## 2018-12-22 LAB — MATERNIT 21 PLUS CORE, BLOOD
Fetal Fraction: 11
Result (T21): NEGATIVE
Trisomy 13 (Patau syndrome): NEGATIVE
Trisomy 18 (Edwards syndrome): NEGATIVE
Trisomy 21 (Down syndrome): NEGATIVE

## 2019-01-12 ENCOUNTER — Ambulatory Visit (INDEPENDENT_AMBULATORY_CARE_PROVIDER_SITE_OTHER): Payer: Medicaid Other | Admitting: Certified Nurse Midwife

## 2019-01-12 ENCOUNTER — Other Ambulatory Visit: Payer: Self-pay

## 2019-01-12 VITALS — BP 100/60 | Wt 104.0 lb

## 2019-01-12 DIAGNOSIS — Z3492 Encounter for supervision of normal pregnancy, unspecified, second trimester: Secondary | ICD-10-CM

## 2019-01-12 DIAGNOSIS — Z3490 Encounter for supervision of normal pregnancy, unspecified, unspecified trimester: Secondary | ICD-10-CM

## 2019-01-12 DIAGNOSIS — Z1379 Encounter for other screening for genetic and chromosomal anomalies: Secondary | ICD-10-CM

## 2019-01-12 DIAGNOSIS — Z363 Encounter for antenatal screening for malformations: Secondary | ICD-10-CM

## 2019-01-12 DIAGNOSIS — Z3A15 15 weeks gestation of pregnancy: Secondary | ICD-10-CM

## 2019-01-12 LAB — POCT URINALYSIS DIPSTICK OB
Glucose, UA: NEGATIVE
POC,PROTEIN,UA: NEGATIVE

## 2019-01-12 NOTE — Progress Notes (Signed)
C/o not gaining wt - is eating.rj

## 2019-01-12 NOTE — Progress Notes (Signed)
ROB at 15wk2d: No nausea/vomiting in 1 week. Not taking any antiemetics either. Stays away from greasy foods and milk products. MaterniT 21 was negative-desires MSAFP  FH at U-2FB/ FHTs WNL  MSAFP drawn\Anatomy scan and ROB in 4 weeks.  Dalia Heading, CNM

## 2019-01-14 LAB — AFP, SERUM, OPEN SPINA BIFIDA
AFP MoM: 1.5
AFP Value: 58.6 ng/mL
Gest. Age on Collection Date: 15.3 weeks
Maternal Age At EDD: 27.5 yr
OSBR Risk 1 IN: 2734
Test Results:: NEGATIVE
Weight: 104 [lb_av]

## 2019-01-16 NOTE — Progress Notes (Signed)
Normal MSAFP

## 2019-01-29 NOTE — L&D Delivery Note (Signed)
Delivery Note Primary OB: [facility] Delivery Physician: Barnett Applebaum, MD Gestational Age: Premature at [redacted] weeks gestation Antepartum complications: chronic abruption, preterm labor Intrapartum complications: None  A viable Female was delivered via vertex presentation. "Nolan" Apgars:9 ,9  Weight:  pending .   Placenta status: spontaneous and Intact.  Cord: 3+ vessels;  with the following complications: nuchal.  No signs of significant abruption  Anesthesia:  epidural Episiotomy:  none Lacerations:  none Suture Repair: none Est. Blood Loss (mL):  less than 100 mL  Mom to postpartum.  Baby to Couplet care / Skin to Skin.  Barnett Applebaum, MD, [facility], [facility] 06/10/2019  3:13 PM [phone]

## 2019-02-09 ENCOUNTER — Ambulatory Visit (INDEPENDENT_AMBULATORY_CARE_PROVIDER_SITE_OTHER): Payer: Medicaid Other

## 2019-02-09 ENCOUNTER — Encounter: Payer: Self-pay | Admitting: Obstetrics & Gynecology

## 2019-02-09 ENCOUNTER — Ambulatory Visit (INDEPENDENT_AMBULATORY_CARE_PROVIDER_SITE_OTHER): Payer: Medicaid Other | Admitting: Obstetrics & Gynecology

## 2019-02-09 ENCOUNTER — Other Ambulatory Visit: Payer: Self-pay

## 2019-02-09 VITALS — BP 100/60 | Wt 106.0 lb

## 2019-02-09 DIAGNOSIS — Z3490 Encounter for supervision of normal pregnancy, unspecified, unspecified trimester: Secondary | ICD-10-CM

## 2019-02-09 DIAGNOSIS — Z363 Encounter for antenatal screening for malformations: Secondary | ICD-10-CM | POA: Diagnosis not present

## 2019-02-09 DIAGNOSIS — Z3A19 19 weeks gestation of pregnancy: Secondary | ICD-10-CM

## 2019-02-09 DIAGNOSIS — Z3482 Encounter for supervision of other normal pregnancy, second trimester: Secondary | ICD-10-CM

## 2019-02-09 NOTE — Progress Notes (Signed)
  Subjective  Fetal Movement? yes Contractions? no Leaking Fluid? no Vaginal Bleeding? no  Objective  BP 100/60   Wt 106 lb (48.1 kg)   LMP 09/27/2018 (Exact Date)   BMI 20.03 kg/m  General: NAD Pumonary: no increased work of breathing Abdomen: gravid, non-tender Extremities: no edema Psychiatric: mood appropriate, affect full  Assessment  29 y.o. G2P1001 at [redacted]w[redacted]d by  07/04/2019, by Last Menstrual Period presenting for routine prenatal visit  Plan   Problem List Items Addressed This Visit      Other   Supervision of low-risk pregnancy    Other Visit Diagnoses    [redacted] weeks gestation of pregnancy    -  Primary      Pregnancy #2 Problems (from 09/27/18 to present)    Problem Noted Resolved   Supervision of low-risk pregnancy 11/13/2018 by Dalia Heading, CNM No   Overview Addendum 02/09/2019  2:27 PM by Gae Dry, MD    Clinic [facility] Prenatal Labs  Dating By LMP c/w 7w u/s Blood type: A/Positive/-- (10/09 1458)   Genetic Screen NIPS: Normal Female. MSAFP: negative Antibody:Negative (10/09 1458)  Anatomic [place] 19 wks WNL Rubella: 3.25 (10/09 1458) Varicella: Immune  GTT 28 wk:  RPR: Non Reactive (10/09 1458)   Rhogam NA HBsAg: Negative (10/09 1458)   Vaccines TDAP:                       Flu Shot: 10/9 HIV: Non Reactive (10/09 1458)   Baby Food Breast                               GBS:   Contraception IUD Pap:10/2018  CBB  No   CS/VBAC n/a   Support Person Husband Quillian Quince            The following were addressed during this visit:  Breastfeeding Education - Early initiation of breastfeeding  - The importance of exclusive breastfeeding  - Risks of giving your baby anything other than breast milk if you are breastfeeding  - Nonpharmacological pain relief methods for labor  - The importance of early skin-to-skin contact  - Rooming-in on a 24-hour basis  - Feeding on demand or baby-led feeding  - Frequent feeding to help assure optimal milk production  -  Effective positioning and attachment  - Exclusive breastfeeding for the first 6 months  - Individualized Education   18-21 weeks - Ultrasound   32-35 weeks - Birth Control Plans / Tubal Consent  Plans IUD   Barnett Applebaum, MD, [facility], [facility] 02/09/2019  2:28 PM

## 2019-02-09 NOTE — Patient Instructions (Signed)

## 2019-03-09 ENCOUNTER — Other Ambulatory Visit: Payer: Self-pay

## 2019-03-09 ENCOUNTER — Ambulatory Visit (INDEPENDENT_AMBULATORY_CARE_PROVIDER_SITE_OTHER): Payer: Medicaid Other | Admitting: Certified Nurse Midwife

## 2019-03-09 VITALS — Wt 107.0 lb

## 2019-03-09 DIAGNOSIS — Z3491 Encounter for supervision of normal pregnancy, unspecified, first trimester: Secondary | ICD-10-CM

## 2019-03-09 DIAGNOSIS — Z131 Encounter for screening for diabetes mellitus: Secondary | ICD-10-CM

## 2019-03-09 DIAGNOSIS — Z3A23 23 weeks gestation of pregnancy: Secondary | ICD-10-CM

## 2019-03-09 DIAGNOSIS — Z113 Encounter for screening for infections with a predominantly sexual mode of transmission: Secondary | ICD-10-CM

## 2019-03-09 DIAGNOSIS — F419 Anxiety disorder, unspecified: Secondary | ICD-10-CM

## 2019-03-09 DIAGNOSIS — Z3492 Encounter for supervision of normal pregnancy, unspecified, second trimester: Secondary | ICD-10-CM

## 2019-03-09 MED ORDER — FAMOTIDINE 20 MG PO TABS: 20.0000 mg | ORAL_TABLET | Freq: Two times a day (BID) | ORAL | 3 refills | Status: DC

## 2019-03-09 MED ORDER — SERTRALINE HCL 50 MG PO TABS: 50.0000 mg | ORAL_TABLET | Freq: Every day | ORAL | 5 refills | Status: DC

## 2019-03-09 NOTE — Progress Notes (Signed)
Telephone ROB at 23wk2d: Has several concerns most stemming from increased stress in custody battle with ex. 1). Having migraines 2-4 x /week. Takes Tylenol which usually resolves headaches. Sometimes has to take a nap. 2.)She has been having some BH contractions lasting 15-20 sec a couple times a week.  3.) Increased anxiety. Seeing a therapist since May. Needs refill on Zoloft. 4) Has been waking up at 4-5 Am with substernal chest pain. Has been having reflux issues. Will take Alka Seltzer for this. Then when she wakens she has trouble falling asleep thinking about the custody battle. Baby is moving well.  May be having reflux issues waking her up at night. Will try Pepcid at hs to see if that helps prevent her. To let me know if no relief.  Plan: 28 week labs and ROB in 4 weeks.  Jeanette Miranda, CNM

## 2019-03-09 NOTE — Progress Notes (Signed)
C/o needs refill pnv and zoloft.  B-H lower abd, lessens c movement.  Migraines.

## 2019-03-11 ENCOUNTER — Other Ambulatory Visit: Payer: Self-pay | Admitting: Obstetrics & Gynecology

## 2019-03-11 MED ORDER — CITRANATAL ASSURE 35-1 & 300 MG PO MISC: 2.0000 | Freq: Every day | ORAL | 11 refills | Status: DC

## 2019-03-12 ENCOUNTER — Other Ambulatory Visit: Payer: Self-pay | Admitting: Obstetrics & Gynecology

## 2019-03-12 MED ORDER — PRENATE MINI 18-0.6-0.4-350 MG PO CAPS: 1.0000 | ORAL_CAPSULE | Freq: Every day | ORAL | 11 refills | Status: DC

## 2019-04-06 ENCOUNTER — Other Ambulatory Visit: Payer: Self-pay

## 2019-04-06 ENCOUNTER — Encounter: Payer: Self-pay | Admitting: Obstetrics and Gynecology

## 2019-04-06 ENCOUNTER — Other Ambulatory Visit: Payer: Medicaid Other

## 2019-04-06 ENCOUNTER — Ambulatory Visit (INDEPENDENT_AMBULATORY_CARE_PROVIDER_SITE_OTHER): Payer: Medicaid Other | Admitting: Obstetrics and Gynecology

## 2019-04-06 ENCOUNTER — Other Ambulatory Visit: Payer: Self-pay | Admitting: Certified Nurse Midwife

## 2019-04-06 VITALS — BP 98/52 | Wt 113.0 lb

## 2019-04-06 DIAGNOSIS — R102 Pelvic and perineal pain: Secondary | ICD-10-CM

## 2019-04-06 DIAGNOSIS — Z3491 Encounter for supervision of normal pregnancy, unspecified, first trimester: Secondary | ICD-10-CM

## 2019-04-06 DIAGNOSIS — M5489 Other dorsalgia: Secondary | ICD-10-CM

## 2019-04-06 DIAGNOSIS — Z131 Encounter for screening for diabetes mellitus: Secondary | ICD-10-CM

## 2019-04-06 DIAGNOSIS — Z23 Encounter for immunization: Secondary | ICD-10-CM | POA: Diagnosis not present

## 2019-04-06 DIAGNOSIS — Z3A27 27 weeks gestation of pregnancy: Secondary | ICD-10-CM

## 2019-04-06 DIAGNOSIS — Z3492 Encounter for supervision of normal pregnancy, unspecified, second trimester: Secondary | ICD-10-CM

## 2019-04-06 NOTE — Patient Instructions (Signed)

## 2019-04-06 NOTE — Progress Notes (Signed)
ROB/GTT C/o round ligament pain/back pain  Denies lof, no vb, Good FM

## 2019-04-06 NOTE — Progress Notes (Signed)
    Routine Prenatal Care Visit  Subjective  Jeanette Miranda is a 29 y.o. G2P1001 at [redacted]w[redacted]d being seen today for ongoing prenatal care.  She is currently monitored for the following issues for this low-risk pregnancy and has Breast mass in female; Mastalgia; LLQ pain; Endometriosis; Supervision of low-risk pregnancy; Asthma affecting pregnancy, antepartum; Depression; Hyperemesis; and Anxiety on their problem list.  ----------------------------------------------------------------------------------- Patient reports backache.   Contractions: Not present. Vag. Bleeding: None.  Movement: Present. Denies leaking of fluid.  ----------------------------------------------------------------------------------- The following portions of the patient's history were reviewed and updated as appropriate: allergies, current medications, past family history, past medical history, past social history, past surgical history and problem list. Problem list updated.   Objective  Blood pressure (!) 98/52, weight 113 lb (51.3 kg), last menstrual period 09/27/2018, currently breastfeeding. Pregravid weight 110 lb (49.9 kg) Total Weight Gain 3 lb (1.361 kg) Urinalysis:      Fetal Status: Fetal Heart Rate (bpm): 140 Fundal Height: 27 cm Movement: Present     General:  Alert, oriented and cooperative. Patient is in no acute distress.  Skin: Skin is warm and dry. No rash noted.   Cardiovascular: Normal heart rate noted  Respiratory: Normal respiratory effort, no problems with respiration noted  Abdomen: Soft, gravid, appropriate for gestational age. Pain/Pressure: Present     Pelvic:  Cervical exam deferred        Extremities: Normal range of motion.  Edema: None  Mental Status: Normal mood and affect. Normal behavior. Normal judgment and thought content.     Assessment   29 y.o. G2P1001 at [redacted]w[redacted]d by  07/04/2019, by Last Menstrual Period presenting for routine prenatal visit  Plan   Pregnancy #2 Problems (from  09/27/18 to present)    Problem Noted Resolved   Supervision of low-risk pregnancy 11/13/2018 by Dalia Heading, CNM No   Overview Addendum 02/09/2019  2:27 PM by Gae Dry, MD    [facility] Prenatal Labs  Dating By LMP c/w 7w u/s Blood type: A/Positive/-- (10/09 1458)   Genetic Screen NIPS: Normal Female. MSAFP: negative Antibody:Negative (10/09 1458)  Anatomic [place] 19 wks WNL Rubella: 3.25 (10/09 1458) Varicella: Immune  GTT 28 wk:  RPR: Non Reactive (10/09 1458)   Rhogam NA HBsAg: Negative (10/09 1458)   Vaccines TDAP:                       Flu Shot: 10/9 HIV: Non Reactive (10/09 1458)   Baby Food Breast                               GBS:   Contraception IUD Pap:10/2018  CBB  No   CS/VBAC n/a   Support Person Husband Quillian Quince              Gestational age appropriate obstetric precautions including but not limited to vaginal bleeding, contractions, leaking of fluid and fetal movement were reviewed in detail with the patient.    Return in about 2 weeks (around 04/20/2019) for ROB in person.  Homero Fellers MD [facility], [facility] 04/06/2019, 3:21 PM

## 2019-04-07 LAB — 28 WEEK RH+PANEL
Basophils Absolute: 0 10*3/uL (ref 0.0–0.2)
Basos: 0 %
EOS (ABSOLUTE): 0 10*3/uL (ref 0.0–0.4)
Eos: 1 %
Gestational Diabetes Screen: 146 mg/dL — ABNORMAL HIGH (ref 65–139)
HIV Screen 4th Generation wRfx: NONREACTIVE
Hematocrit: 34.2 % (ref 34.0–46.6)
Hemoglobin: 11.5 g/dL (ref 11.1–15.9)
Immature Grans (Abs): 0.1 10*3/uL (ref 0.0–0.1)
Immature Granulocytes: 1 %
Lymphocytes Absolute: 1.4 10*3/uL (ref 0.7–3.1)
Lymphs: 18 %
MCH: 31.2 pg (ref 26.6–33.0)
MCHC: 33.6 g/dL (ref 31.5–35.7)
MCV: 93 fL (ref 79–97)
Monocytes Absolute: 0.5 10*3/uL (ref 0.1–0.9)
Monocytes: 6 %
Neutrophils Absolute: 6 10*3/uL (ref 1.4–7.0)
Neutrophils: 74 %
Platelets: 182 10*3/uL (ref 150–450)
RBC: 3.69 x10E6/uL — ABNORMAL LOW (ref 3.77–5.28)
RDW: 12.3 % (ref 11.7–15.4)
RPR Ser Ql: NONREACTIVE
WBC: 8.2 10*3/uL (ref 3.4–10.8)

## 2019-04-08 ENCOUNTER — Other Ambulatory Visit: Payer: Self-pay | Admitting: Obstetrics and Gynecology

## 2019-04-08 DIAGNOSIS — R7309 Other abnormal glucose: Secondary | ICD-10-CM

## 2019-04-08 NOTE — Telephone Encounter (Signed)
Patient is schedule for 04/16/19 for 3 gtt

## 2019-04-08 NOTE — Telephone Encounter (Signed)
Called and dicussed with patient- will need 3 hr GTT. Please call to schedule.

## 2019-04-13 ENCOUNTER — Encounter: Payer: Self-pay | Admitting: Obstetrics & Gynecology

## 2019-04-13 ENCOUNTER — Ambulatory Visit (INDEPENDENT_AMBULATORY_CARE_PROVIDER_SITE_OTHER): Payer: Medicaid Other | Admitting: Obstetrics & Gynecology

## 2019-04-13 ENCOUNTER — Telehealth: Payer: Self-pay

## 2019-04-13 ENCOUNTER — Other Ambulatory Visit: Payer: Self-pay

## 2019-04-13 VITALS — BP 100/60 | Wt 113.0 lb

## 2019-04-13 DIAGNOSIS — R319 Hematuria, unspecified: Secondary | ICD-10-CM | POA: Diagnosis not present

## 2019-04-13 DIAGNOSIS — R5383 Other fatigue: Secondary | ICD-10-CM

## 2019-04-13 DIAGNOSIS — M5489 Other dorsalgia: Secondary | ICD-10-CM | POA: Diagnosis not present

## 2019-04-13 NOTE — Telephone Encounter (Signed)
Pt calling c/o blood in urine, do we have anywhere we can put her today to be seen?

## 2019-04-13 NOTE — Progress Notes (Signed)
HPI:      [name] is a 29 y.o. G2P1001 who LMP was Patient's last menstrual period was 09/27/2018 (exact date)., presents today for a problem visit.  She is also [redacted] weeks pregnant yet comes in for a concerning but new concern.  Patient complains of hematuria . She has had symptoms for today, none yesterday, and no new trauma, or sex, or other inciting event to cause blood in urine.  Patient also complains of mild back pain but htis has been present for a while.. Patient denies fever, vaginal discharge and contractions, also no flank T or dysuria. Patient does not have a history of recurrent UTI.  Patient does not have a history of pyelonephritis.   PMHx: She  has a past medical history of Asthma and Depression. Also,  has a past surgical history that includes Tonsillectomy; Wisdom tooth extraction (Bilateral); and Breast biopsy., family history includes Crohn's disease in her mother.,  reports that she has never smoked. She has never used smokeless tobacco. She reports that she does not drink alcohol or use drugs.  She has a current medication list which includes the following prescription(s): albuterol, famotidine, prenate mini, and sertraline. Also, has No Known Allergies.  Review of Systems  Constitutional: Positive for malaise/fatigue. Negative for chills and fever.  HENT: Negative for congestion, sinus pain and sore throat.   Eyes: Negative for blurred vision and pain.  Respiratory: Negative for cough and wheezing.   Cardiovascular: Negative for chest pain and leg swelling.  Gastrointestinal: Negative for abdominal pain, constipation, diarrhea, heartburn, nausea and vomiting.  Genitourinary: Positive for hematuria. Negative for dysuria, frequency and urgency.  Musculoskeletal: Negative for back pain, joint pain, myalgias and neck pain.  Skin: Negative for itching and rash.  Neurological: Negative for dizziness, tremors and weakness.  Endo/Heme/Allergies: Does not bruise/bleed  easily.  Psychiatric/Behavioral: Negative for depression. The patient is not nervous/anxious and does not have insomnia.     Objective: BP 100/60   Wt 113 lb (51.3 kg)   LMP 09/27/2018 (Exact Date)   BMI 21.35 kg/m  Physical Exam Constitutional:      General: She is not in acute distress.    Appearance: She is well-developed.  Genitourinary:     Pelvic exam was performed with patient supine.     Urethra, bladder and vagina normal.     No vaginal erythema or bleeding.     No cervical motion tenderness, discharge, bleeding, polyp or nabothian cyst.     Uterus is enlarged and mobile.     No uterine mass detected.    Uterus is midaxial.     No right or left adnexal mass present.     Right adnexa not tender.     Left adnexa not tender.     Genitourinary Comments: Cervix not dilated, and thick Vertex in very low position on exam  HENT:     Head: Normocephalic and atraumatic.     Nose: Nose normal.  Abdominal:     General: There is no distension.     Palpations: Abdomen is soft.     Tenderness: There is no abdominal tenderness.  Musculoskeletal:        General: Normal range of motion.  Neurological:     Mental Status: She is alert and oriented to person, place, and time.     Cranial Nerves: No cranial nerve deficit.  Skin:    General: Skin is warm and dry.  Psychiatric:  Attention and Perception: Attention normal.        Mood and Affect: Mood and affect normal.        Speech: Speech normal.        Behavior: Behavior normal.        Thought Content: Thought content normal.        Judgment: Judgment normal.     ASSESSMENT/PLAN:   Concern for acute cystitis, nephrolithiasis, cervical bleedin (although exam normal and cervix not bleeding or dilating).  Urine culture Monitor for pain or further bleeding in urine S/SX PTL advised  A total of 20 minutes were spent face-to-face with the patient as well as preparation, review, communication, and documentation during  this encounter.   Barnett Applebaum, MD, [facility], [facility] 04/13/2019  4:07 PM

## 2019-04-13 NOTE — Telephone Encounter (Signed)
Patient is schedule for 04/13/19 with [facility]

## 2019-04-15 LAB — URINE CULTURE

## 2019-04-16 ENCOUNTER — Other Ambulatory Visit: Payer: Self-pay

## 2019-04-16 ENCOUNTER — Other Ambulatory Visit: Payer: Self-pay | Admitting: Obstetrics and Gynecology

## 2019-04-16 ENCOUNTER — Other Ambulatory Visit: Payer: Medicaid Other

## 2019-04-16 DIAGNOSIS — R7309 Other abnormal glucose: Secondary | ICD-10-CM

## 2019-04-17 LAB — GESTATIONAL GLUCOSE TOLERANCE
Glucose, Fasting: 71 mg/dL (ref 65–94)
Glucose, GTT - 1 Hour: 119 mg/dL (ref 65–179)
Glucose, GTT - 2 Hour: 111 mg/dL (ref 65–154)
Glucose, GTT - 3 Hour: 84 mg/dL (ref 65–139)

## 2019-04-20 ENCOUNTER — Encounter: Payer: Self-pay | Admitting: Obstetrics and Gynecology

## 2019-04-20 ENCOUNTER — Ambulatory Visit (INDEPENDENT_AMBULATORY_CARE_PROVIDER_SITE_OTHER): Payer: Medicaid Other | Admitting: Obstetrics and Gynecology

## 2019-04-20 ENCOUNTER — Other Ambulatory Visit: Payer: Self-pay

## 2019-04-20 VITALS — BP 100/52 | Wt 114.0 lb

## 2019-04-20 DIAGNOSIS — Z3493 Encounter for supervision of normal pregnancy, unspecified, third trimester: Secondary | ICD-10-CM

## 2019-04-20 DIAGNOSIS — Z23 Encounter for immunization: Secondary | ICD-10-CM | POA: Diagnosis not present

## 2019-04-20 DIAGNOSIS — Z3A29 29 weeks gestation of pregnancy: Secondary | ICD-10-CM

## 2019-04-20 NOTE — Progress Notes (Signed)
ROB  No concerns Denies lof, no vb, Good FM

## 2019-04-20 NOTE — Patient Instructions (Signed)
Third Trimester of Pregnancy The third trimester is from week 28 through week 40 (months 7 through 9). The third trimester is a time when the unborn baby (fetus) is growing rapidly. At the end of the ninth month, the fetus is about 20 inches in length and weighs 6-10 pounds. Body changes during your third trimester Your body will continue to go through many changes during pregnancy. The changes vary from woman to woman. During the third trimester:  Your weight will continue to increase. You can expect to gain 25-35 pounds (11-16 kg) by the end of the pregnancy.  You may begin to get stretch marks on your hips, abdomen, and breasts.  You may urinate more often because the fetus is moving lower into your pelvis and pressing on your bladder.  You may develop or continue to have heartburn. This is caused by increased hormones that slow down muscles in the digestive tract.  You may develop or continue to have constipation because increased hormones slow digestion and cause the muscles that push waste through your intestines to relax.  You may develop hemorrhoids. These are swollen veins (varicose veins) in the rectum that can itch or be painful.  You may develop swollen, bulging veins (varicose veins) in your legs.  You may have increased body aches in the pelvis, back, or thighs. This is due to weight gain and increased hormones that are relaxing your joints.  You may have changes in your hair. These can include thickening of your hair, rapid growth, and changes in texture. Some women also have hair loss during or after pregnancy, or hair that feels dry or thin. Your hair will most likely return to normal after your baby is born.  Your breasts will continue to grow and they will continue to become tender. A yellow fluid (colostrum) may leak from your breasts. This is the first milk you are producing for your baby.  Your belly button may stick out.  You may notice more swelling in your hands,  face, or ankles.  You may have increased tingling or numbness in your hands, arms, and legs. The skin on your belly may also feel numb.  You may feel short of breath because of your expanding uterus.  You may have more problems sleeping. This can be caused by the size of your belly, increased need to urinate, and an increase in your body's metabolism.  You may notice the fetus "dropping," or moving lower in your abdomen (lightening).  You may have increased vaginal discharge.  You may notice your joints feel loose and you may have pain around your pelvic bone. What to expect at prenatal visits You will have prenatal exams every 2 weeks until week 36. Then you will have weekly prenatal exams. During a routine prenatal visit:  You will be weighed to make sure you and the baby are growing normally.  Your blood pressure will be taken.  Your abdomen will be measured to track your baby's growth.  The fetal heartbeat will be listened to.  Any test results from the previous visit will be discussed.  You may have a cervical check near your due date to see if your cervix has softened or thinned (effaced).  You will be tested for Group B streptococcus. This happens between 35 and 37 weeks. Your health care provider may ask you:  What your birth plan is.  How you are feeling.  If you are feeling the baby move.  If you have had any abnormal   symptoms, such as leaking fluid, bleeding, severe headaches, or abdominal cramping.  If you are using any tobacco products, including cigarettes, chewing tobacco, and electronic cigarettes.  If you have any questions. Other tests or screenings that may be performed during your third trimester include:  Blood tests that check for low iron levels (anemia).  Fetal testing to check the health, activity level, and growth of the fetus. Testing is done if you have certain medical conditions or if there are problems during the pregnancy.  Nonstress test  (NST). This test checks the health of your baby to make sure there are no signs of problems, such as the baby not getting enough oxygen. During this test, a belt is placed around your belly. The baby is made to move, and its heart rate is monitored during movement. What is false labor? False labor is a condition in which you feel small, irregular tightenings of the muscles in the womb (contractions) that usually go away with rest, changing position, or drinking water. These are called Braxton Hicks contractions. Contractions may last for hours, days, or even weeks before true labor sets in. If contractions come at regular intervals, become more frequent, increase in intensity, or become painful, you should see your health care provider. What are the signs of labor?  Abdominal cramps.  Regular contractions that start at 10 minutes apart and become stronger and more frequent with time.  Contractions that start on the top of the uterus and spread down to the lower abdomen and back.  Increased pelvic pressure and dull back pain.  A watery or bloody mucus discharge that comes from the vagina.  Leaking of amniotic fluid. This is also known as your "water breaking." It could be a slow trickle or a gush. Let your health care provider know if it has a color or strange odor. If you have any of these signs, call your health care provider right away, even if it is before your due date. Follow these instructions at home: Medicines  Follow your health care provider's instructions regarding medicine use. Specific medicines may be either safe or unsafe to take during pregnancy.  Take a prenatal vitamin that contains at least 600 micrograms (mcg) of folic acid.  If you develop constipation, try taking a stool softener if your health care provider approves. Eating and drinking   Eat a balanced diet that includes fresh fruits and vegetables, whole grains, good sources of protein such as meat, eggs, or tofu,  and low-fat dairy. Your health care provider will help you determine the amount of weight gain that is right for you.  Avoid raw meat and uncooked cheese. These carry germs that can cause birth defects in the baby.  If you have low calcium intake from food, talk to your health care provider about whether you should take a daily calcium supplement.  Eat four or five small meals rather than three large meals a day.  Limit foods that are high in fat and processed sugars, such as fried and sweet foods.  To prevent constipation: ? Drink enough fluid to keep your urine clear or pale yellow. ? Eat foods that are high in fiber, such as fresh fruits and vegetables, whole grains, and beans. Activity  Exercise only as directed by your health care provider. Most women can continue their usual exercise routine during pregnancy. Try to exercise for 30 minutes at least 5 days a week. Stop exercising if you experience uterine contractions.  Avoid heavy lifting.  Do   not exercise in extreme heat or humidity, or at high altitudes.  Wear low-heel, comfortable shoes.  Practice good posture.  You may continue to have sex unless your health care provider tells you otherwise. Relieving pain and discomfort  Take frequent breaks and rest with your legs elevated if you have leg cramps or low back pain.  Take warm sitz baths to soothe any pain or discomfort caused by hemorrhoids. Use hemorrhoid cream if your health care provider approves.  Wear a good support bra to prevent discomfort from breast tenderness.  If you develop varicose veins: ? Wear support pantyhose or compression stockings as told by your healthcare provider. ? Elevate your feet for 15 minutes, 3-4 times a day. Prenatal care  Write down your questions. Take them to your prenatal visits.  Keep all your prenatal visits as told by your health care provider. This is important. Safety  Wear your seat belt at all times when driving.  Make  a list of emergency phone numbers, including numbers for family, friends, the hospital, and police and fire departments. General instructions  Avoid cat litter boxes and soil used by cats. These carry germs that can cause birth defects in the baby. If you have a cat, ask someone to clean the litter box for you.  Do not travel far distances unless it is absolutely necessary and only with the approval of your health care provider.  Do not use hot tubs, steam rooms, or saunas.  Do not drink alcohol.  Do not use any products that contain nicotine or tobacco, such as cigarettes and e-cigarettes. If you need help quitting, ask your health care provider.  Do not use any medicinal herbs or unprescribed drugs. These chemicals affect the formation and growth of the baby.  Do not douche or use tampons or scented sanitary pads.  Do not cross your legs for long periods of time.  To prepare for the arrival of your baby: ? Take prenatal classes to understand, practice, and ask questions about labor and delivery. ? Make a trial run to the hospital. ? Visit the hospital and tour the maternity area. ? Arrange for maternity or paternity leave through employers. ? Arrange for family and friends to take care of pets while you are in the hospital. ? Purchase a rear-facing car seat and make sure you know how to install it in your car. ? Pack your hospital bag. ? Prepare the baby's nursery. Make sure to remove all pillows and stuffed animals from the baby's crib to prevent suffocation.  Visit your dentist if you have not gone during your pregnancy. Use a soft toothbrush to brush your teeth and be gentle when you floss. Contact a health care provider if:  You are unsure if you are in labor or if your water has broken.  You become dizzy.  You have mild pelvic cramps, pelvic pressure, or nagging pain in your abdominal area.  You have lower back pain.  You have persistent nausea, vomiting, or  diarrhea.  You have an unusual or bad smelling vaginal discharge.  You have pain when you urinate. Get help right away if:  Your water breaks before 37 weeks.  You have regular contractions less than 5 minutes apart before 37 weeks.  You have a fever.  You are leaking fluid from your vagina.  You have spotting or bleeding from your vagina.  You have severe abdominal pain or cramping.  You have rapid weight loss or weight gain.  You have   shortness of breath with chest pain.  You notice sudden or extreme swelling of your face, hands, ankles, feet, or legs.  Your baby makes fewer than 10 movements in 2 hours.  You have severe headaches that do not go away when you take medicine.  You have vision changes. Summary  The third trimester is from week 28 through week 40, months 7 through 9. The third trimester is a time when the unborn baby (fetus) is growing rapidly.  During the third trimester, your discomfort may increase as you and your baby continue to gain weight. You may have abdominal, leg, and back pain, sleeping problems, and an increased need to urinate.  During the third trimester your breasts will keep growing and they will continue to become tender. A yellow fluid (colostrum) may leak from your breasts. This is the first milk you are producing for your baby.  False labor is a condition in which you feel small, irregular tightenings of the muscles in the womb (contractions) that eventually go away. These are called Braxton Hicks contractions. Contractions may last for hours, days, or even weeks before true labor sets in.  Signs of labor can include: abdominal cramps; regular contractions that start at 10 minutes apart and become stronger and more frequent with time; watery or bloody mucus discharge that comes from the vagina; increased pelvic pressure and dull back pain; and leaking of amniotic fluid. This information is not intended to replace advice given to you by your  health care provider. Make sure you discuss any questions you have with your health care provider. Document Revised: 05/07/2018 Document Reviewed: 02/20/2016 Elsevier Patient Education  2020 [employer]  

## 2019-04-20 NOTE — Progress Notes (Signed)
    Routine Prenatal Care Visit  Subjective  Jeanette Miranda is a 29 y.o. G2P1001 at [redacted]w[redacted]d being seen today for ongoing prenatal care.  She is currently monitored for the following issues for this low-risk pregnancy and has Breast mass in female; Mastalgia; LLQ pain; Endometriosis; Supervision of low-risk pregnancy; Asthma affecting pregnancy, antepartum; Depression; Hyperemesis; and Anxiety on their problem list.  ----------------------------------------------------------------------------------- Patient reports no complaints. Reports pelvic pain and tenderness over her pubic symphysis .  Contractions: Not present. Vag. Bleeding: None.  Movement: Present. Denies leaking of fluid.  ----------------------------------------------------------------------------------- The following portions of the patient's history were reviewed and updated as appropriate: allergies, current medications, past family history, past medical history, past social history, past surgical history and problem list. Problem list updated.   Objective  Blood pressure (!) 100/52, weight 114 lb (51.7 kg), last menstrual period 09/27/2018, currently breastfeeding. Pregravid weight 110 lb (49.9 kg) Total Weight Gain 4 lb (1.814 kg) Urinalysis:      Fetal Status: Fetal Heart Rate (bpm): 155 Fundal Height: 28 cm Movement: Present     General:  Alert, oriented and cooperative. Patient is in no acute distress.  Skin: Skin is warm and dry. No rash noted.   Cardiovascular: Normal heart rate noted  Respiratory: Normal respiratory effort, no problems with respiration noted  Abdomen: Soft, gravid, appropriate for gestational age. Pain/Pressure: Present     Pelvic:  Cervical exam deferred        Extremities: Normal range of motion.     Mental Status: Normal mood and affect. Normal behavior. Normal judgment and thought content.     Assessment   29 y.o. G2P1001 at [redacted]w[redacted]d by  07/04/2019, by Last Menstrual Period presenting for routine  prenatal visit  Plan   Pregnancy #2 Problems (from 09/27/18 to present)    Problem Noted Resolved   Supervision of low-risk pregnancy 11/13/2018 by Dalia Heading, CNM No   Overview Addendum 04/20/2019  9:34 AM by Homero Fellers, [place]  Dating By LMP c/w 7w u/s Blood type: A/Positive/-- (10/09 1458)   Genetic Screen NIPS: Normal Female. MSAFP: negative Antibody:Negative (10/09 1458)  Anatomic [place] 19 wks WNL Rubella: 3.25 (10/09 1458) Varicella: Immune  GTT 28 wk: 146 3hr- all nml values RPR: Non Reactive (10/09 1458)   Rhogam NA HBsAg: Negative (10/09 1458)   Vaccines TDAP:                       Flu Shot: 10/9 HIV: Non Reactive (10/09 1458)   Baby Food Breast                               GBS:   Contraception IUD Pap:10/2018  CBB  No   CS/VBAC n/a   Support Person Husband Rolly Pancake today  Gestational age appropriate obstetric precautions including but not limited to vaginal bleeding, contractions, leaking of fluid and fetal movement were reviewed in detail with the patient.    Return in about 2 weeks (around 05/04/2019) for ROB in person.  Homero Fellers MD [facility], [facility] 04/20/2019, 9:43 AM

## 2019-05-03 ENCOUNTER — Telehealth: Payer: Self-pay

## 2019-05-03 NOTE — Telephone Encounter (Signed)
Calling pt to check on her after she left a message on after hours about being dizzy. Pt has appt tomorrow. Has not been dizzy since she called.

## 2019-05-04 ENCOUNTER — Other Ambulatory Visit: Payer: Self-pay

## 2019-05-04 ENCOUNTER — Ambulatory Visit (INDEPENDENT_AMBULATORY_CARE_PROVIDER_SITE_OTHER): Payer: Medicaid Other | Admitting: Obstetrics and Gynecology

## 2019-05-04 ENCOUNTER — Encounter: Payer: Self-pay | Admitting: Obstetrics and Gynecology

## 2019-05-04 VITALS — BP 100/52 | Wt 117.0 lb

## 2019-05-04 DIAGNOSIS — Z3493 Encounter for supervision of normal pregnancy, unspecified, third trimester: Secondary | ICD-10-CM

## 2019-05-04 DIAGNOSIS — Z3A31 31 weeks gestation of pregnancy: Secondary | ICD-10-CM

## 2019-05-04 NOTE — Progress Notes (Signed)
ROB C/o pain/pressure Denies lof, no vb, Good FM

## 2019-05-04 NOTE — Patient Instructions (Signed)
Third Trimester of Pregnancy The third trimester is from week 28 through week 40 (months 7 through 9). The third trimester is a time when the unborn baby (fetus) is growing rapidly. At the end of the ninth month, the fetus is about 20 inches in length and weighs 6-10 pounds. Body changes during your third trimester Your body will continue to go through many changes during pregnancy. The changes vary from woman to woman. During the third trimester:  Your weight will continue to increase. You can expect to gain 25-35 pounds (11-16 kg) by the end of the pregnancy.  You may begin to get stretch marks on your hips, abdomen, and breasts.  You may urinate more often because the fetus is moving lower into your pelvis and pressing on your bladder.  You may develop or continue to have heartburn. This is caused by increased hormones that slow down muscles in the digestive tract.  You may develop or continue to have constipation because increased hormones slow digestion and cause the muscles that push waste through your intestines to relax.  You may develop hemorrhoids. These are swollen veins (varicose veins) in the rectum that can itch or be painful.  You may develop swollen, bulging veins (varicose veins) in your legs.  You may have increased body aches in the pelvis, back, or thighs. This is due to weight gain and increased hormones that are relaxing your joints.  You may have changes in your hair. These can include thickening of your hair, rapid growth, and changes in texture. Some women also have hair loss during or after pregnancy, or hair that feels dry or thin. Your hair will most likely return to normal after your baby is born.  Your breasts will continue to grow and they will continue to become tender. A yellow fluid (colostrum) may leak from your breasts. This is the first milk you are producing for your baby.  Your belly button may stick out.  You may notice more swelling in your hands,  face, or ankles.  You may have increased tingling or numbness in your hands, arms, and legs. The skin on your belly may also feel numb.  You may feel short of breath because of your expanding uterus.  You may have more problems sleeping. This can be caused by the size of your belly, increased need to urinate, and an increase in your body's metabolism.  You may notice the fetus "dropping," or moving lower in your abdomen (lightening).  You may have increased vaginal discharge.  You may notice your joints feel loose and you may have pain around your pelvic bone. What to expect at prenatal visits You will have prenatal exams every 2 weeks until week 36. Then you will have weekly prenatal exams. During a routine prenatal visit:  You will be weighed to make sure you and the baby are growing normally.  Your blood pressure will be taken.  Your abdomen will be measured to track your baby's growth.  The fetal heartbeat will be listened to.  Any test results from the previous visit will be discussed.  You may have a cervical check near your due date to see if your cervix has softened or thinned (effaced).  You will be tested for Group B streptococcus. This happens between 35 and 37 weeks. Your health care provider may ask you:  What your birth plan is.  How you are feeling.  If you are feeling the baby move.  If you have had any abnormal   symptoms, such as leaking fluid, bleeding, severe headaches, or abdominal cramping.  If you are using any tobacco products, including cigarettes, chewing tobacco, and electronic cigarettes.  If you have any questions. Other tests or screenings that may be performed during your third trimester include:  Blood tests that check for low iron levels (anemia).  Fetal testing to check the health, activity level, and growth of the fetus. Testing is done if you have certain medical conditions or if there are problems during the pregnancy.  Nonstress test  (NST). This test checks the health of your baby to make sure there are no signs of problems, such as the baby not getting enough oxygen. During this test, a belt is placed around your belly. The baby is made to move, and its heart rate is monitored during movement. What is false labor? False labor is a condition in which you feel small, irregular tightenings of the muscles in the womb (contractions) that usually go away with rest, changing position, or drinking water. These are called Braxton Hicks contractions. Contractions may last for hours, days, or even weeks before true labor sets in. If contractions come at regular intervals, become more frequent, increase in intensity, or become painful, you should see your health care provider. What are the signs of labor?  Abdominal cramps.  Regular contractions that start at 10 minutes apart and become stronger and more frequent with time.  Contractions that start on the top of the uterus and spread down to the lower abdomen and back.  Increased pelvic pressure and dull back pain.  A watery or bloody mucus discharge that comes from the vagina.  Leaking of amniotic fluid. This is also known as your "water breaking." It could be a slow trickle or a gush. Let your health care provider know if it has a color or strange odor. If you have any of these signs, call your health care provider right away, even if it is before your due date. Follow these instructions at home: Medicines  Follow your health care provider's instructions regarding medicine use. Specific medicines may be either safe or unsafe to take during pregnancy.  Take a prenatal vitamin that contains at least 600 micrograms (mcg) of folic acid.  If you develop constipation, try taking a stool softener if your health care provider approves. Eating and drinking   Eat a balanced diet that includes fresh fruits and vegetables, whole grains, good sources of protein such as meat, eggs, or tofu,  and low-fat dairy. Your health care provider will help you determine the amount of weight gain that is right for you.  Avoid raw meat and uncooked cheese. These carry germs that can cause birth defects in the baby.  If you have low calcium intake from food, talk to your health care provider about whether you should take a daily calcium supplement.  Eat four or five small meals rather than three large meals a day.  Limit foods that are high in fat and processed sugars, such as fried and sweet foods.  To prevent constipation: ? Drink enough fluid to keep your urine clear or pale yellow. ? Eat foods that are high in fiber, such as fresh fruits and vegetables, whole grains, and beans. Activity  Exercise only as directed by your health care provider. Most women can continue their usual exercise routine during pregnancy. Try to exercise for 30 minutes at least 5 days a week. Stop exercising if you experience uterine contractions.  Avoid heavy lifting.  Do   not exercise in extreme heat or humidity, or at high altitudes.  Wear low-heel, comfortable shoes.  Practice good posture.  You may continue to have sex unless your health care provider tells you otherwise. Relieving pain and discomfort  Take frequent breaks and rest with your legs elevated if you have leg cramps or low back pain.  Take warm sitz baths to soothe any pain or discomfort caused by hemorrhoids. Use hemorrhoid cream if your health care provider approves.  Wear a good support bra to prevent discomfort from breast tenderness.  If you develop varicose veins: ? Wear support pantyhose or compression stockings as told by your healthcare provider. ? Elevate your feet for 15 minutes, 3-4 times a day. Prenatal care  Write down your questions. Take them to your prenatal visits.  Keep all your prenatal visits as told by your health care provider. This is important. Safety  Wear your seat belt at all times when driving.  Make  a list of emergency phone numbers, including numbers for family, friends, the hospital, and police and fire departments. General instructions  Avoid cat litter boxes and soil used by cats. These carry germs that can cause birth defects in the baby. If you have a cat, ask someone to clean the litter box for you.  Do not travel far distances unless it is absolutely necessary and only with the approval of your health care provider.  Do not use hot tubs, steam rooms, or saunas.  Do not drink alcohol.  Do not use any products that contain nicotine or tobacco, such as cigarettes and e-cigarettes. If you need help quitting, ask your health care provider.  Do not use any medicinal herbs or unprescribed drugs. These chemicals affect the formation and growth of the baby.  Do not douche or use tampons or scented sanitary pads.  Do not cross your legs for long periods of time.  To prepare for the arrival of your baby: ? Take prenatal classes to understand, practice, and ask questions about labor and delivery. ? Make a trial run to the hospital. ? Visit the hospital and tour the maternity area. ? Arrange for maternity or paternity leave through employers. ? Arrange for family and friends to take care of pets while you are in the hospital. ? Purchase a rear-facing car seat and make sure you know how to install it in your car. ? Pack your hospital bag. ? Prepare the baby's nursery. Make sure to remove all pillows and stuffed animals from the baby's crib to prevent suffocation.  Visit your dentist if you have not gone during your pregnancy. Use a soft toothbrush to brush your teeth and be gentle when you floss. Contact a health care provider if:  You are unsure if you are in labor or if your water has broken.  You become dizzy.  You have mild pelvic cramps, pelvic pressure, or nagging pain in your abdominal area.  You have lower back pain.  You have persistent nausea, vomiting, or  diarrhea.  You have an unusual or bad smelling vaginal discharge.  You have pain when you urinate. Get help right away if:  Your water breaks before 37 weeks.  You have regular contractions less than 5 minutes apart before 37 weeks.  You have a fever.  You are leaking fluid from your vagina.  You have spotting or bleeding from your vagina.  You have severe abdominal pain or cramping.  You have rapid weight loss or weight gain.  You have   shortness of breath with chest pain.  You notice sudden or extreme swelling of your face, hands, ankles, feet, or legs.  Your baby makes fewer than 10 movements in 2 hours.  You have severe headaches that do not go away when you take medicine.  You have vision changes. Summary  The third trimester is from week 28 through week 40, months 7 through 9. The third trimester is a time when the unborn baby (fetus) is growing rapidly.  During the third trimester, your discomfort may increase as you and your baby continue to gain weight. You may have abdominal, leg, and back pain, sleeping problems, and an increased need to urinate.  During the third trimester your breasts will keep growing and they will continue to become tender. A yellow fluid (colostrum) may leak from your breasts. This is the first milk you are producing for your baby.  False labor is a condition in which you feel small, irregular tightenings of the muscles in the womb (contractions) that eventually go away. These are called Braxton Hicks contractions. Contractions may last for hours, days, or even weeks before true labor sets in.  Signs of labor can include: abdominal cramps; regular contractions that start at 10 minutes apart and become stronger and more frequent with time; watery or bloody mucus discharge that comes from the vagina; increased pelvic pressure and dull back pain; and leaking of amniotic fluid. This information is not intended to replace advice given to you by your  health care provider. Make sure you discuss any questions you have with your health care provider. Document Revised: 05/07/2018 Document Reviewed: 02/20/2016 Elsevier Patient Education  2020 [employer]  

## 2019-05-04 NOTE — Progress Notes (Signed)
    Routine Prenatal Care Visit  Subjective  Jeanette Miranda is a 29 y.o. G2P1001 at [redacted]w[redacted]d being seen today for ongoing prenatal care.  She is currently monitored for the following issues for this low-risk pregnancy and has Breast mass in female; Mastalgia; LLQ pain; Endometriosis; Supervision of low-risk pregnancy; Asthma affecting pregnancy, antepartum; Depression; Hyperemesis; and Anxiety on their problem list.  ----------------------------------------------------------------------------------- Patient reports no complaints.   Contractions: Not present. Vag. Bleeding: None.  Movement: Present. Denies leaking of fluid.  ----------------------------------------------------------------------------------- The following portions of the patient's history were reviewed and updated as appropriate: allergies, current medications, past family history, past medical history, past social history, past surgical history and problem list. Problem list updated.   Objective  Blood pressure (!) 100/52, weight 117 lb (53.1 kg), last menstrual period 09/27/2018, currently breastfeeding. Pregravid weight 110 lb (49.9 kg) Total Weight Gain 7 lb (3.175 kg) Urinalysis:      Fetal Status: Fetal Heart Rate (bpm): 140 Fundal Height: 30 cm Movement: Present     General:  Alert, oriented and cooperative. Patient is in no acute distress.  Skin: Skin is warm and dry. No rash noted.   Cardiovascular: Normal heart rate noted  Respiratory: Normal respiratory effort, no problems with respiration noted  Abdomen: Soft, gravid, appropriate for gestational age. Pain/Pressure: Absent     Pelvic:  Cervical exam deferred        Extremities: Normal range of motion.     Mental Status: Normal mood and affect. Normal behavior. Normal judgment and thought content.     Assessment   29 y.o. G2P1001 at [redacted]w[redacted]d by  07/04/2019, by Last Menstrual Period presenting for routine prenatal visit  Plan   Pregnancy #2 Problems (from  09/27/18 to present)    Problem Noted Resolved   Supervision of low-risk pregnancy 11/13/2018 by Dalia Heading, CNM No   Overview Addendum 04/20/2019  9:43 AM by Homero Fellers, [place]  Dating By LMP c/w 7w u/s Blood type: A/Positive/-- (10/09 1458)   Genetic Screen NIPS: Normal Female. MSAFP: negative Antibody:Negative (10/09 1458)  Anatomic [place] 19 wks WNL Rubella: 3.25 (10/09 1458) Varicella: Immune  GTT 28 wk: 146 3hr- all nml values RPR: Non Reactive (10/09 1458)   Rhogam NA HBsAg: Negative (10/09 1458)   Vaccines TDAP: 04/20/2019                  Flu Shot: 11/06/2018 HIV: Non Reactive (10/09 1458)   Baby Food Breast                               GBS:   Contraception IUD Pap:10/2018  CBB  No   CS/VBAC n/a   Support Person Husband Quillian Quince             Completed some baby friendly teaching.  Patient aware of lactation classes.  Gestational age appropriate obstetric precautions including but not limited to vaginal bleeding, contractions, leaking of fluid and fetal movement were reviewed in detail with the patient.    Return in about 2 weeks (around 05/18/2019) for ROB in person.  Homero Fellers MD [facility], [facility] 05/04/2019, 4:37 PM

## 2019-05-16 ENCOUNTER — Encounter: Payer: Self-pay | Admitting: Obstetrics and Gynecology

## 2019-05-16 ENCOUNTER — Other Ambulatory Visit: Payer: Self-pay

## 2019-05-16 ENCOUNTER — Observation Stay
Admission: EM | Admit: 2019-05-16 | Discharge: 2019-05-17 | Disposition: A | Discharge status: Home / Self Care | Payer: Medicaid Other | Attending: Obstetrics and Gynecology | Admitting: Obstetrics and Gynecology

## 2019-05-16 DIAGNOSIS — F329 Major depressive disorder, single episode, unspecified: Secondary | ICD-10-CM | POA: Diagnosis not present

## 2019-05-16 DIAGNOSIS — F419 Anxiety disorder, unspecified: Secondary | ICD-10-CM | POA: Diagnosis not present

## 2019-05-16 DIAGNOSIS — O4703 False labor before 37 completed weeks of gestation, third trimester: Principal | ICD-10-CM | POA: Insufficient documentation

## 2019-05-16 DIAGNOSIS — Z79899 Other long term (current) drug therapy: Secondary | ICD-10-CM | POA: Insufficient documentation

## 2019-05-16 DIAGNOSIS — J45909 Unspecified asthma, uncomplicated: Secondary | ICD-10-CM | POA: Diagnosis not present

## 2019-05-16 DIAGNOSIS — Z20822 Contact with and (suspected) exposure to covid-19: Secondary | ICD-10-CM | POA: Insufficient documentation

## 2019-05-16 DIAGNOSIS — O99343 Other mental disorders complicating pregnancy, third trimester: Secondary | ICD-10-CM | POA: Insufficient documentation

## 2019-05-16 DIAGNOSIS — O4693 Antepartum hemorrhage, unspecified, third trimester: Secondary | ICD-10-CM | POA: Insufficient documentation

## 2019-05-16 DIAGNOSIS — Z888 Allergy status to other drugs, medicaments and biological substances status: Secondary | ICD-10-CM | POA: Insufficient documentation

## 2019-05-16 DIAGNOSIS — Z3A33 33 weeks gestation of pregnancy: Secondary | ICD-10-CM | POA: Diagnosis not present

## 2019-05-16 DIAGNOSIS — Z3493 Encounter for supervision of normal pregnancy, unspecified, third trimester: Secondary | ICD-10-CM

## 2019-05-16 HISTORY — DX: Anxiety disorder, unspecified: F41.9

## 2019-05-16 LAB — FIBRINOGEN: Fibrinogen: 429 mg/dL (ref 210–475)

## 2019-05-16 LAB — KLEIHAUER-BETKE STAIN
Fetal Cells %: 0.8 %
Quantitation Fetal Hemoglobin: 0.0038 mL

## 2019-05-16 LAB — RESPIRATORY PANEL BY RT PCR (FLU A&B, COVID)
Influenza A by PCR: NEGATIVE
Influenza B by PCR: NEGATIVE
SARS Coronavirus 2 by RT PCR: NEGATIVE

## 2019-05-16 LAB — URINALYSIS, COMPLETE (UACMP) WITH MICROSCOPIC
Bacteria, UA: NONE SEEN
Bilirubin Urine: NEGATIVE
Glucose, UA: 50 mg/dL — AB
Ketones, ur: NEGATIVE mg/dL
Leukocytes,Ua: NEGATIVE
Nitrite: NEGATIVE
Protein, ur: 100 mg/dL — AB
RBC / HPF: >50 RBC/hpf — ABNORMAL HIGH (ref 0–5)
Specific Gravity, Urine: 1.019 (ref 1.005–1.030)
Squamous Epithelial / HPF: NONE SEEN (ref 0–5)
pH: 7 (ref 5.0–8.0)

## 2019-05-16 LAB — APTT: aPTT: 26 seconds (ref 24–36)

## 2019-05-16 LAB — ABO/RH: ABO/RH(D): A POS

## 2019-05-16 LAB — CBC
HCT: 33.1 % — ABNORMAL LOW (ref 36.0–46.0)
Hemoglobin: 11.4 g/dL — ABNORMAL LOW (ref 12.0–15.0)
MCH: 31.3 pg (ref 26.0–34.0)
MCHC: 34.4 g/dL (ref 30.0–36.0)
MCV: 90.9 fL (ref 80.0–100.0)
Platelets: 170 10*3/uL (ref 150–400)
RBC: 3.64 MIL/uL — ABNORMAL LOW (ref 3.87–5.11)
RDW: 12.7 % (ref 11.5–15.5)
WBC: 8.6 10*3/uL (ref 4.0–10.5)
nRBC: 0 % (ref 0.0–0.2)

## 2019-05-16 LAB — PROTIME-INR
INR: 0.9 (ref 0.8–1.2)
Prothrombin Time: 12.5 seconds (ref 11.4–15.2)

## 2019-05-16 MED ORDER — BUTORPHANOL TARTRATE 1 MG/ML IJ SOLN
1.0000 mg | INTRAMUSCULAR | Status: DC | PRN
  Administered 2019-05-16 (×2): 1 mg via INTRAVENOUS
  Filled 2019-05-16 (×2): qty 1

## 2019-05-16 MED ORDER — SOD CITRATE-CITRIC ACID 500-334 MG/5ML PO SOLN: 30.0000 mL | ORAL | Status: DC | PRN

## 2019-05-16 MED ORDER — ONDANSETRON HCL 4 MG/2ML IJ SOLN: 4.0000 mg | Freq: Four times a day (QID) | INTRAMUSCULAR | Status: DC | PRN

## 2019-05-16 MED ORDER — LACTATED RINGERS IV SOLN
500.0000 mL | INTRAVENOUS | Status: DC | PRN
  Administered 2019-05-17: 100 mL via INTRAVENOUS

## 2019-05-16 MED ORDER — SODIUM CHLORIDE 0.9 % IV SOLN
5.0000 10*6.[IU] | Freq: Once | INTRAVENOUS | Status: AC
  Administered 2019-05-16: 5 10*6.[IU] via INTRAVENOUS
  Filled 2019-05-16: qty 5

## 2019-05-16 MED ORDER — FAMOTIDINE 20 MG PO TABS
20.0000 mg | ORAL_TABLET | Freq: Two times a day (BID) | ORAL | Status: DC
  Administered 2019-05-16: 23:00:00 20 mg via ORAL
  Filled 2019-05-16: qty 1

## 2019-05-16 MED ORDER — ACETAMINOPHEN 325 MG PO TABS: 650.0000 mg | ORAL_TABLET | ORAL | Status: DC | PRN

## 2019-05-16 MED ORDER — LACTATED RINGERS IV SOLN: INTRAVENOUS | Status: DC

## 2019-05-16 MED ORDER — BETAMETHASONE SOD PHOS & ACET 6 (3-3) MG/ML IJ SUSP
12.0000 mg | INTRAMUSCULAR | Status: AC
  Administered 2019-05-16 – 2019-05-17 (×2): 12 mg via INTRAMUSCULAR
  Filled 2019-05-16 (×2): qty 5

## 2019-05-16 MED ORDER — PENICILLIN G 3 MILLION UNITS IVPB - SIMPLE MED
3.0000 10*6.[IU] | INTRAVENOUS | Status: DC
  Administered 2019-05-16 – 2019-05-17 (×5): 3 10*6.[IU] via INTRAVENOUS
  Filled 2019-05-16 (×5): qty 100

## 2019-05-16 NOTE — OB Triage Note (Signed)
Back pain radiating around to abdomen. Pain started around 0700 this am. Then started noticing vaginal bleeding and passing quarter size clots. Jeanette Miranda

## 2019-05-16 NOTE — Progress Notes (Signed)
   Subjective:  Still feeling painful contractions  Objective:   Vitals: Blood pressure 114/68, pulse 77, temperature 98.3 F (36.8 C), temperature source Oral, resp. rate 18, height 5\' 1"  (1.549 m), weight 53.1 kg, last menstrual period 09/27/2018, currently breastfeeding. General: painfully contracting Abdomen: soft, non-tender Cervical Exam: 3/80/-1   FHT: 130, moderate, +Accels, no decels Toco: q3-63min  Results for orders placed or performed during the hospital encounter of 05/16/19 (from the past 24 hour(s))  CBC on admission     Status: Abnormal   Collection Time: 05/16/19 10:33 AM  Result Value Ref Range   WBC 8.6 4.0 - 10.5 K/uL   RBC 3.64 (L) 3.87 - 5.11 MIL/uL   Hemoglobin 11.4 (L) 12.0 - 15.0 g/dL   HCT 33.1 (L) 36.0 - 46.0 %   MCV 90.9 80.0 - 100.0 fL   MCH 31.3 26.0 - 34.0 pg   MCHC 34.4 30.0 - 36.0 g/dL   RDW 12.7 11.5 - 15.5 %   Platelets 170 150 - 400 K/uL   nRBC 0.0 0.0 - 0.2 %  Fibrinogen     Status: None   Collection Time: 05/16/19 10:33 AM  Result Value Ref Range   Fibrinogen 429 210 - 475 mg/dL  APTT     Status: None   Collection Time: 05/16/19 10:33 AM  Result Value Ref Range   aPTT 26 24 - 36 seconds  Protime-INR     Status: None   Collection Time: 05/16/19 10:33 AM  Result Value Ref Range   Prothrombin Time 12.5 11.4 - 15.2 seconds   INR 0.9 0.8 - 1.2  Urinalysis, Complete w Microscopic     Status: Abnormal   Collection Time: 05/16/19 10:33 AM  Result Value Ref Range   Color, Urine AMBER (A) YELLOW   APPearance CLOUDY (A) CLEAR   Specific Gravity, Urine 1.019 1.005 - 1.030   pH 7.0 5.0 - 8.0   Glucose, UA 50 (A) NEGATIVE mg/dL   Hgb urine dipstick LARGE (A) NEGATIVE   Bilirubin Urine NEGATIVE NEGATIVE   Ketones, ur NEGATIVE NEGATIVE mg/dL   Protein, ur 100 (A) NEGATIVE mg/dL   Nitrite NEGATIVE NEGATIVE   Leukocytes,Ua NEGATIVE NEGATIVE   RBC / HPF >50 (H) 0 - 5 RBC/hpf   WBC, UA 6-10 0 - 5 WBC/hpf   Bacteria, UA NONE SEEN NONE SEEN   Squamous Epithelial / LPF NONE SEEN 0 - 5    Assessment:   29 y.o. G2P1001 [redacted]w[redacted]d with possible preterm labor  Plan:   1) Pre-term Labor - minimal cervical change but cervix much more anterior and more effaced on this particularl check.  Will start PCN for GBS unknown at this time.  No further bleeding initial labs norma; with KB pending.  Has received dose 1 of BMZ.    2) Fetus - cat I tracing  Malachy Mood, MD, [facility] 05/16/2019, 12:31 PM

## 2019-05-16 NOTE — H&P (Signed)
Obstetric H&P   Chief Complaint: Vaginal bleeding  Prenatal Care Provider: [facility]  History of Present Illness: 29 y.o. G2P1001 [redacted]w[redacted]d by 07/04/2019, by Last Menstrual Period presenting to L&D presenting with vaginal bleeding that started this morning.  Has had some light bleeding since.  No abdominal trauma, no history of high blood pressure or abruption.  +FM, no LOF.    Pregnancy has been uncomplicated to date.      Pregravid weight 49.9 kg Total Weight Gain 3.175 kg  Pregnancy #2 Problems (from 09/27/18 to present)    Problem Noted Resolved   Supervision of low-risk pregnancy 11/13/2018 by Dalia Heading, CNM No   Overview Addendum 05/04/2019  4:41 PM by Homero Fellers, [place] Prenatal Labs  Dating By LMP c/w 7w u/s Blood type: A/Positive/-- (10/09 1458)   Genetic Screen NIPS: Normal Female. MSAFP: negative Antibody:Negative (10/09 1458)  Anatomic [place] 19 wks WNL Rubella: 3.25 (10/09 1458) Varicella: Immune  GTT 28 wk: 1-hr OGTT 146 (71 / 119 / 111 / 84) RPR: Non Reactive (10/09 1458)   Rhogam NA HBsAg: Negative (10/09 1458)   Vaccines TDAP: 04/20/2019                  Flu Shot: 11/06/2018 HIV: Non Reactive (10/09 1458)   Baby Food Breast- nursed son for 10 months                             GBS: unknown  Contraception IUD Pap:10/2018  CBB  No   CS/VBAC n/a   Support Person Husband Quillian Quince              Review of Systems: 10 point review of systems negative unless otherwise noted in HPI  Past Medical History: Patient Active Problem List   Diagnosis Date Noted  . Vaginal bleeding in pregnancy, third trimester 05/16/2019  . Anxiety 03/09/2019  . Hyperemesis 11/14/2018  . Supervision of low-risk pregnancy 11/13/2018    Clinic [facility] Prenatal Labs  Dating By LMP c/w 7w u/s Blood type: A/Positive/-- (10/09 1458)   Genetic Screen NIPS: Normal Female. MSAFP: negative Antibody:Negative (10/09 1458)  Anatomic [place] 19 wks WNL Rubella: 3.25 (10/09 1458) Varicella:  Immune  GTT 28 wk: 146 3hr- all nml values RPR: Non Reactive (10/09 1458)   Rhogam NA HBsAg: Negative (10/09 1458)   Vaccines TDAP: 04/20/2019                  Flu Shot: 11/06/2018 HIV: Non Reactive (10/09 1458)   Baby Food Breast- nursed son for 10 months                             GBS:   Contraception IUD Pap:10/2018  CBB  No   CS/VBAC n/a   Support Person Husband Quillian Quince       . Asthma affecting pregnancy, antepartum 11/13/2018  . Depression 11/13/2018  . Endometriosis 08/26/2018  . LLQ pain 12/18/2017  . Mastalgia 04/01/2017  . Breast mass in female 03/07/2017    Past Surgical History: Past Surgical History:  Procedure Laterality Date  . BREAST BIOPSY    . TONSILLECTOMY    . WISDOM TOOTH EXTRACTION Bilateral     Past Obstetric History: # 1 - Date: 07/10/14, Sex: Female, Weight: 3140 g, GA: [redacted]w[redacted]d, Delivery: Vaginal, Spontaneous, Apgar1: 8, Apgar5: 9, Living: Living, Birth Comments: None  #  2 - Date: None, Sex: None, Weight: None, GA: None, Delivery: None, Apgar1: None, Apgar5: None, Living: None, Birth Comments: None   Family History: Family History  Problem Relation Age of Onset  . Crohn's disease Mother     Social History: Social History   Socioeconomic History  . Marital status: Married    Spouse name: Not on file  . Number of children: Not on file  . Years of education: Not on file  . Highest education level: Not on file  Occupational History  . Not on file  Tobacco Use  . Smoking status: Never Smoker  . Smokeless tobacco: Never Used  Substance and Sexual Activity  . Alcohol use: No  . Drug use: No  . Sexual activity: Yes    Birth control/protection: None  Other Topics Concern  . Not on file  Social History Narrative  . Not on file   Social Determinants of Health   Financial Resource Strain:   . Difficulty of Paying Living Expenses:   Food Insecurity:   . Worried About Charity fundraiser in the Last Year:   . Arboriculturist in the Last  Year:   Transportation Needs:   . Film/video editor (Medical):   Marland Kitchen Lack of Transportation (Non-Medical):   Physical Activity:   . Days of Exercise per Week:   . Minutes of Exercise per Session:   Stress:   . Feeling of Stress :   Social Connections:   . Frequency of Communication with Friends and Family:   . Frequency of Social Gatherings with Friends and Family:   . Attends Religious Services:   . Active Member of Clubs or Organizations:   . Attends Archivist Meetings:   Marland Kitchen Marital Status:   Intimate Partner Violence:   . Fear of Current or Ex-Partner:   . Emotionally Abused:   Marland Kitchen Physically Abused:   . Sexually Abused:     Medications: Prior to Admission medications   Medication Sig Start Date End Date Taking? Authorizing Provider  famotidine (PEPCID) 20 MG tablet Take 1 tablet (20 mg total) by mouth 2 (two) times daily. 03/09/19  Yes Dalia Heading, CNM  Prenat-FeCbn-FeAsp-Meth-FA-DHA (PRENATE MINI) 18-0.6-0.4-350 MG CAPS Take 1 capsule by mouth daily. 03/12/19  Yes Gae Dry, MD  sertraline (ZOLOFT) 50 MG tablet Take 1 tablet (50 mg total) by mouth daily. 03/09/19  Yes Dalia Heading, CNM  albuterol (VENTOLIN HFA) 108 (90 Base) MCG/ACT inhaler Inhale 2 puffs into the lungs every 4 (four) hours as needed for wheezing or shortness of breath. Patient not taking: Reported on 05/16/2019 03/07/17   Gae Dry, MD    Allergies: Allergies  Allergen Reactions  . Reglan [Metoclopramide] Other (See Comments)    Hot flash, unpleasant feeling     Physical Exam: Vitals: Blood pressure 114/68, pulse 77, temperature 98.3 F (36.8 C), temperature source Oral, resp. rate 18, height 5\' 1"  (1.549 m), weight 53.1 kg, last menstrual period 09/27/2018, currently breastfeeding.  Urine Dip Protein: UA pending  FHT: 150, moderate, +accels, no decels Toco:q44min  General: NAD HEENT: normocephalic, anicteric Pulmonary: No increased work of breathing Abdomen:  Gravid, non-tender Leopolds:vtx Genitourinary: 2.5/50/-3 Extremities: no edema, erythema, or tenderness Neurologic: Grossly intact Psychiatric: mood appropriate, affect full  Labs: Results for orders placed or performed during the hospital encounter of 05/16/19 (from the past 24 hour(s))  CBC on admission     Status: Abnormal   Collection Time: 05/16/19 10:33 AM  Result  Value Ref Range   WBC 8.6 4.0 - 10.5 K/uL   RBC 3.64 (L) 3.87 - 5.11 MIL/uL   Hemoglobin 11.4 (L) 12.0 - 15.0 g/dL   HCT 33.1 (L) 36.0 - 46.0 %   MCV 90.9 80.0 - 100.0 fL   MCH 31.3 26.0 - 34.0 pg   MCHC 34.4 30.0 - 36.0 g/dL   RDW 12.7 11.5 - 15.5 %   Platelets 170 150 - 400 K/uL   nRBC 0.0 0.0 - 0.2 %  Urinalysis, Complete w Microscopic     Status: Abnormal   Collection Time: 05/16/19 10:33 AM  Result Value Ref Range   Color, Urine AMBER (A) YELLOW   APPearance CLOUDY (A) CLEAR   Specific Gravity, Urine 1.019 1.005 - 1.030   pH 7.0 5.0 - 8.0   Glucose, UA 50 (A) NEGATIVE mg/dL   Hgb urine dipstick LARGE (A) NEGATIVE   Bilirubin Urine NEGATIVE NEGATIVE   Ketones, ur NEGATIVE NEGATIVE mg/dL   Protein, ur 100 (A) NEGATIVE mg/dL   Nitrite NEGATIVE NEGATIVE   Leukocytes,Ua NEGATIVE NEGATIVE   RBC / HPF >50 (H) 0 - 5 RBC/hpf   WBC, UA 6-10 0 - 5 WBC/hpf   Bacteria, UA NONE SEEN NONE SEEN   Squamous Epithelial / LPF NONE SEEN 0 - 5    Assessment: 29 y.o. G2P1001 [redacted]w[redacted]d by 07/04/2019, by Last Menstrual Period presenting with vaginal bleeding  Plan: 1) Vaginal bleeding - no bleeding noted on exam, patient is contracting and urine does appear blood tinged.   - Rhogam not indicated A+ - CBC, KB, fibrinogen, and PT/PTT ordered - Given also contracting and 2.5cm will start BMZ course - GBS collected  2) Fetus - cat I tracing  3) PNL - Blood type A/Positive/-- (10/09 1458) / Anti-bodyscreen Negative (10/09 1458) / Rubella 3.25 (10/09 1458) / Varicella Immune / RPR Non Reactive (03/09 1522) / HBsAg Negative (10/09  1458) / HIV Non Reactive (03/09 1522) / 1-hr OGTT 146 (71 / 119 / 111 / 84) / GBS    4) Immunization History -  Immunization History  Administered Date(s) Administered  . Influenza,inj,Quad PF,6+ Mos 11/04/2017, 11/16/2018, 04/06/2019  . Tdap 04/20/2019  . Varicella 07/12/2014    5) Disposition - pending work up  Malachy Mood, MD, [place] 05/16/2019, 11:12 AM

## 2019-05-17 DIAGNOSIS — O4703 False labor before 37 completed weeks of gestation, third trimester: Secondary | ICD-10-CM | POA: Diagnosis not present

## 2019-05-17 IMAGING — US US BREAST*R* LIMITED INC AXILLA
1 series · 5 of 5 positions shown · non-contrast
Comparison: None.

CLINICAL DATA: 25-year-old female presenting for evaluation of 2
months of fluctuating focal pain in the upper-outer right breast.

EXAM:
ULTRASOUND OF THE RIGHT BREAST

[Series 1: us breast*right* limited inc axilla · 0.06mm/px · 5 of 5 slices shown]
[im 1/5]
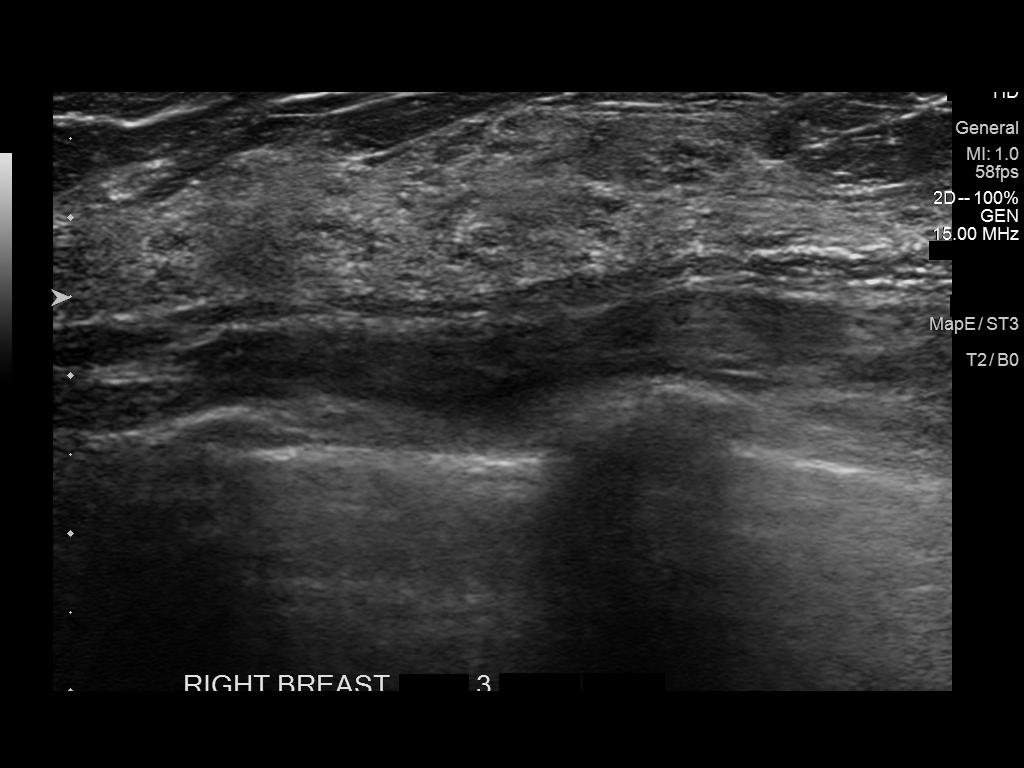
[im 2/5]
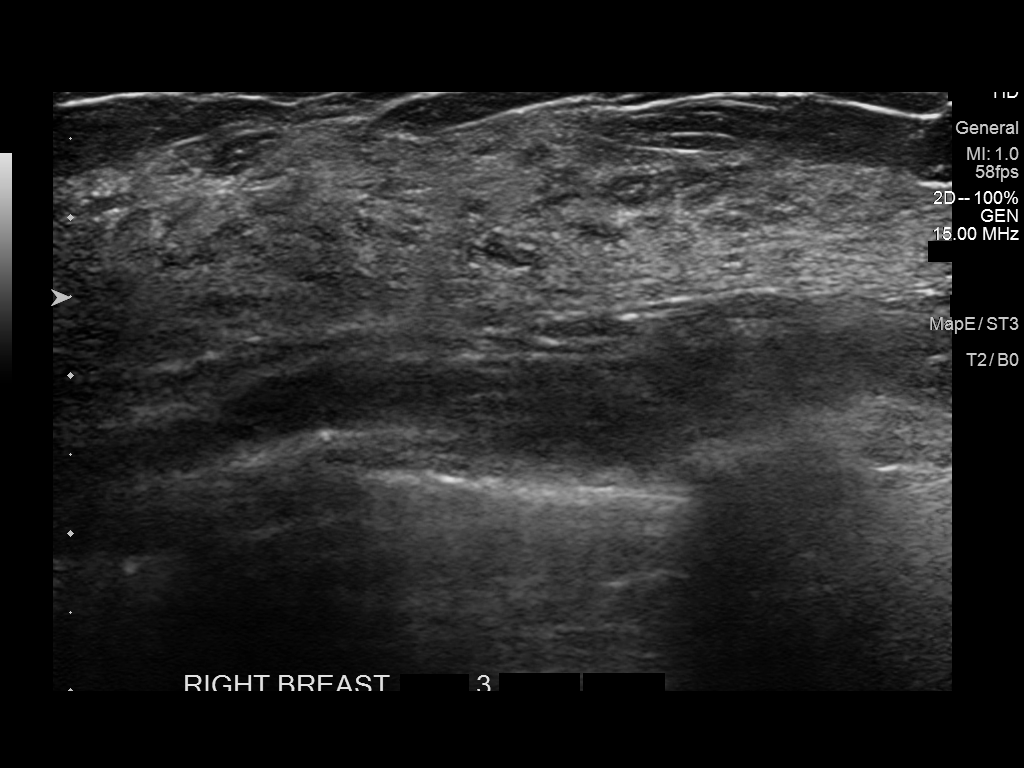
[im 3/5]
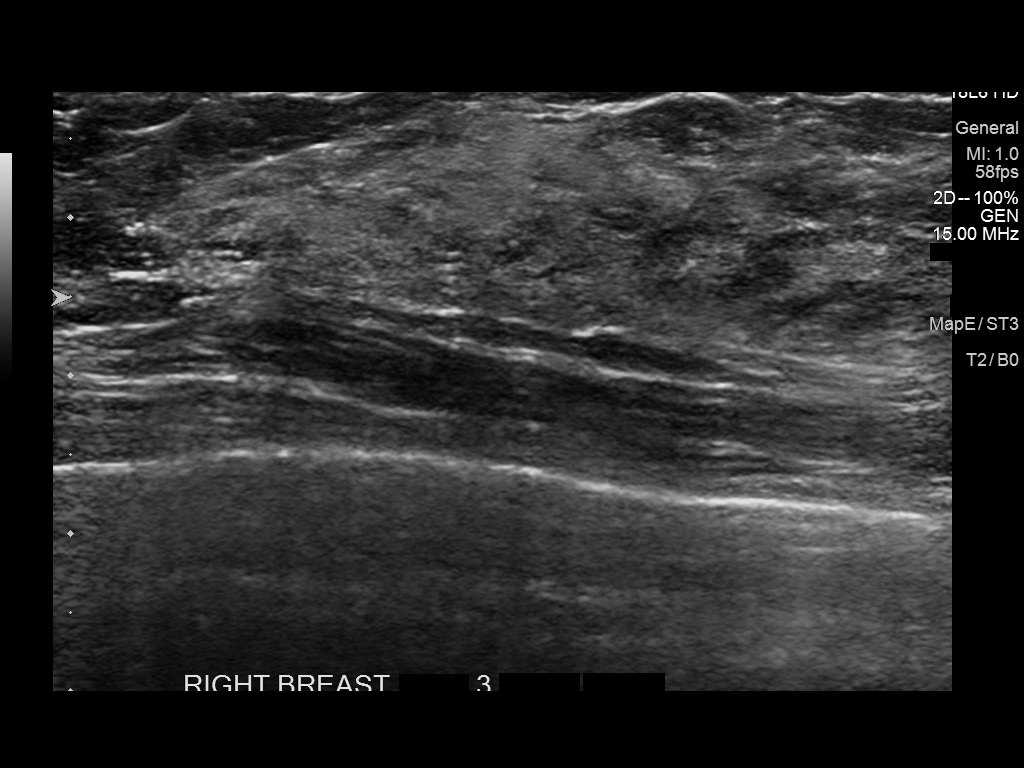
[im 4/5]
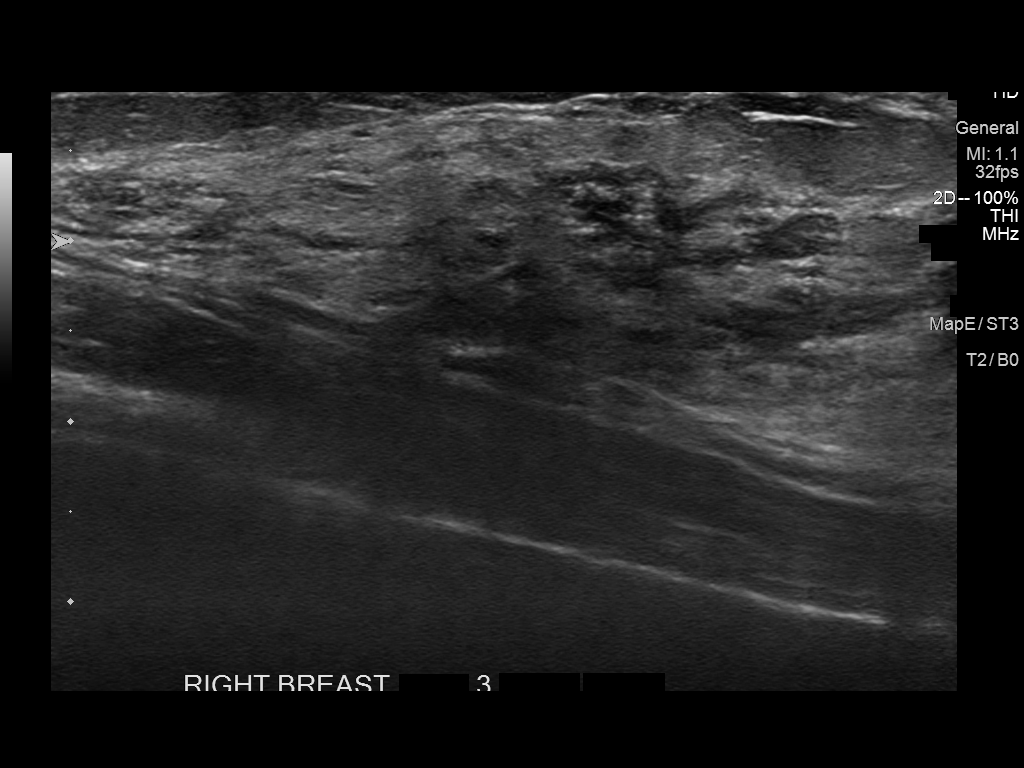
[im 5/5]
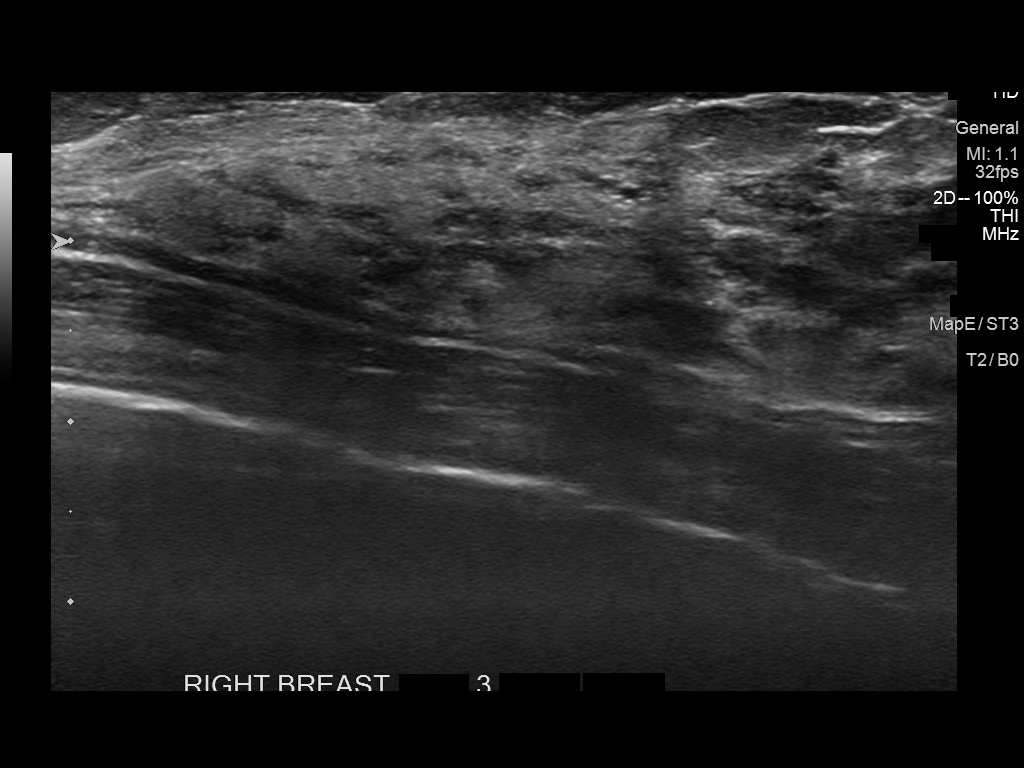

[5 of 5 positions shown; findings below may reference images not displayed]

FINDINGS: On physical exam, there is a firm ridge of tissue in the upper-outer
quadrant without discrete palpable mass. Tenderness is elicited on
palpation.

Targeted ultrasound is performed, showing normal fibroglandular
tissue at the site of focal pain in the right breast at 10 o'clock.
No masses or suspicious areas of shadowing are identified.
IMPRESSION: 1. No targeted sonographic abnormalities are identified to account
for the patient's focal tenderness in the right breast. No targeted
sonographic evidence of right breast malignancy.

RECOMMENDATION:
1. Clinical follow-up recommended for the tender area of concern in
the right breast. Any further workup should be based on clinical
grounds.

2. Screening mammogram at age 41 unless there are persistent or
intervening clinical concerns. (Code:XV-[id number])

I have discussed the findings and recommendations with the patient.
Results were also provided in writing at the conclusion of the
visit. If applicable, a reminder letter will be sent to the patient
regarding the next appointment.

BI-RADS CATEGORY  1: Negative.

## 2019-05-17 MED ORDER — TERBUTALINE SULFATE 1 MG/ML IJ SOLN: 0.2500 mg | Freq: Once | INTRAMUSCULAR | Status: DC

## 2019-05-17 NOTE — Discharge Summary (Signed)
Physician Final Progress Note  Patient ID: Jeanette Miranda MRN: [id number] DOB/AGE: 08-25-1990 29 y.o.  Admit date: 05/16/2019 Admitting provider: Malachy Mood, MD Discharge date: 05/17/2019   Admission Diagnoses: vaginal bleeding, contractions  Discharge Diagnoses:  Active Problems:   Vaginal bleeding in pregnancy, third trimester   Threatened preterm labor, third trimester IUP at 33 weeks Reactive NST Cervical change stable with contractions spacing out S/P second dose BMZ   History of Present Illness: The patient is a 29 y.o. female G2P1001 at [redacted]w[redacted]d who presents for bleeding that began yesterday morning. Over the course of 24 hours she was monitored, labs drawn, IV fluids given, 2 doses of betamethasone were administered, cervical exam was initially 2.5/50/-3 with change to 3/50/-1 and stable, contraction pattern was frequent and painful. By discharge contractions have spaced out to every 8-12 minutes. Cervical exam by different provider at discharge: 3/60-70/-1. Possible progression of effacement, dilation stable. No bleeding noted on exam. Patient discharged to home with instructions and precautions.   Past Medical History:  Diagnosis Date  . Anxiety   . Asthma   . Depression     Past Surgical History:  Procedure Laterality Date  . BREAST BIOPSY    . TONSILLECTOMY    . WISDOM TOOTH EXTRACTION Bilateral     No current facility-administered medications on file prior to encounter.   Current Outpatient Medications on File Prior to Encounter  Medication Sig Dispense Refill  . famotidine (PEPCID) 20 MG tablet Take 1 tablet (20 mg total) by mouth 2 (two) times daily. 60 tablet 3  . Prenat-FeCbn-FeAsp-Meth-FA-DHA (PRENATE MINI) 18-0.6-0.4-350 MG CAPS Take 1 capsule by mouth daily. 30 capsule 11  . sertraline (ZOLOFT) 50 MG tablet Take 1 tablet (50 mg total) by mouth daily. 30 tablet 5  . albuterol (VENTOLIN HFA) 108 (90 Base) MCG/ACT inhaler Inhale 2 puffs into the lungs  every 4 (four) hours as needed for wheezing or shortness of breath. (Patient not taking: Reported on 05/16/2019) 1 Inhaler 3    Allergies  Allergen Reactions  . Reglan [Metoclopramide] Other (See Comments)    Hot flash, unpleasant feeling     Social History   Socioeconomic History  . Marital status: Married    Spouse name: Not on file  . Number of children: Not on file  . Years of education: Not on file  . Highest education level: Not on file  Occupational History  . Not on file  Tobacco Use  . Smoking status: Never Smoker  . Smokeless tobacco: Never Used  Substance and Sexual Activity  . Alcohol use: No  . Drug use: No  . Sexual activity: Yes    Birth control/protection: None  Other Topics Concern  . Not on file  Social History Narrative  . Not on file   Social Determinants of Health   Financial Resource Strain:   . Difficulty of Paying Living Expenses:   Food Insecurity:   . Worried About Charity fundraiser in the Last Year:   . Arboriculturist in the Last Year:   Transportation Needs:   . Film/video editor (Medical):   Marland Kitchen Lack of Transportation (Non-Medical):   Physical Activity:   . Days of Exercise per Week:   . Minutes of Exercise per Session:   Stress:   . Feeling of Stress :   Social Connections:   . Frequency of Communication with Friends and Family:   . Frequency of Social Gatherings with Friends and Family:   .  Attends Religious Services:   . Active Member of Clubs or Organizations:   . Attends Archivist Meetings:   Marland Kitchen Marital Status:   Intimate Partner Violence:   . Fear of Current or Ex-Partner:   . Emotionally Abused:   Marland Kitchen Physically Abused:   . Sexually Abused:     Family History  Problem Relation Age of Onset  . Crohn's disease Mother      Review of Systems  Constitutional: Negative.   HENT: Negative.   Eyes: Negative.   Respiratory: Negative.   Cardiovascular: Negative.   Gastrointestinal: Positive for abdominal  pain.  Genitourinary: Negative.   Musculoskeletal: Negative.   Skin: Negative.   Neurological: Negative.   Endo/Heme/Allergies: Negative.   Psychiatric/Behavioral: Negative.      Physical Exam: BP (!) 97/50   Pulse 65   Temp 97.7 F (36.5 C) (Oral)   Resp 18   Ht 5\' 4"  (1.626 m)   Wt 53.1 kg   LMP 09/27/2018 (Exact Date)   BMI 20.08 kg/m   Constitutional: Well nourished, well developed female in no acute distress.  HEENT: normal Skin: Warm and dry.  Cardiovascular: Regular rate and rhythm.   Extremity: no edema  Respiratory: Clear to auscultation bilateral. Normal respiratory effort Abdomen: FHT present Back: no CVAT Neuro: DTRs 2+, Cranial nerves grossly intact Psych: Alert and Oriented x3. No memory deficits. Normal Miranda and affect.  MS: normal gait, normal bilateral lower extremity ROM/strength/stability.  Pelvic exam: (female chaperone present) is not limited by body habitus EGBUS: within normal limits Vagina: within normal limits and with normal mucosa  Cervix: 3/60-70/-1  Fetal well being: 125 bpm, moderate variability, +accelerations, -decelerations Toco: every 8-12 minutes, mild to palpation   Consults: None  Significant Findings/ Diagnostic Studies: labs:  Results for Jeanette Miranda (MRN [id number]) as of 05/17/2019 11:14  Ref. Range 05/16/2019 10:32 05/16/2019 10:33 05/16/2019 10:45 05/16/2019 15:05  WBC Latest Ref Range: 4.0 - 10.5 K/uL  8.6    RBC Latest Ref Range: 3.87 - 5.11 MIL/uL  3.64 (L)    Hemoglobin Latest Ref Range: 12.0 - 15.0 g/dL  11.4 (L)    HCT Latest Ref Range: 36.0 - 46.0 %  33.1 (L)    MCV Latest Ref Range: 80.0 - 100.0 fL  90.9    MCH Latest Ref Range: 26.0 - 34.0 pg  31.3    MCHC Latest Ref Range: 30.0 - 36.0 g/dL  34.4    RDW Latest Ref Range: 11.5 - 15.5 %  12.7    Platelets Latest Ref Range: 150 - 400 K/uL  170    nRBC Latest Ref Range: 0.0 - 0.2 %  0.0    Fibrinogen Latest Ref Range: 210 - 475 mg/dL  429    Prothrombin Time  Latest Ref Range: 11.4 - 15.2 seconds  12.5    INR Latest Ref Range: 0.8 - 1.2   0.9    APTT Latest Ref Range: 24 - 36 seconds  26    Fetal Cells % Latest Units: %  0.8    Quantitation Fetal Hemoglobin Latest Units: mL  0.0038    # Vials RhIg Unknown  NOT INDICATED    ABO/RH(D) Unknown A POS...     RESPIRATORY PANEL BY RT PCR (FLU A&B, COVID) Unknown    Rpt  Influenza A By PCR Latest Ref Range: NEGATIVE     NEGATIVE  Influenza B By PCR Latest Ref Range: NEGATIVE     NEGATIVE  SARS Coronavirus 2  by RT PCR Latest Ref Range: NEGATIVE     NEGATIVE  Appearance Latest Ref Range: CLEAR   CLOUDY (A)    Bilirubin Urine Latest Ref Range: NEGATIVE   NEGATIVE    Color, Urine Latest Ref Range: YELLOW   AMBER (A)    Glucose, UA Latest Ref Range: NEGATIVE mg/dL  50 (A)    Hgb urine dipstick Latest Ref Range: NEGATIVE   LARGE (A)    Ketones, ur Latest Ref Range: NEGATIVE mg/dL  NEGATIVE    Leukocytes,Ua Latest Ref Range: NEGATIVE   NEGATIVE    Nitrite Latest Ref Range: NEGATIVE   NEGATIVE    pH Latest Ref Range: 5.0 - 8.0   7.0    Protein Latest Ref Range: NEGATIVE mg/dL  100 (A)    Specific Gravity, Urine Latest Ref Range: 1.005 - 1.030   1.019    Bacteria, UA Latest Ref Range: NONE SEEN   NONE SEEN    RBC / HPF Latest Ref Range: 0 - 5 RBC/hpf  >50 (H)    Squamous Epithelial / LPF Latest Ref Range: 0 - 5   NONE SEEN    WBC, UA Latest Ref Range: 0 - 5 WBC/hpf  6-10    CULTURE, BETA STREP (GROUP B ONLY) Unknown   Rpt      Procedures: NST  Hospital Course: The patient was admitted to Labor and Delivery Triage for observation.   Discharge Condition: good  Disposition: Discharge disposition: 01-Home or Self Care  Diet: Regular diet  Discharge Activity: limited activity today, rest, hydrate, nourish, epsom salt soak  Discharge Instructions    Discharge activity:   Complete by: As directed    Light activity today, rest, hydrate, nourish, epsom salt soak   Discharge diet:  No restrictions    Complete by: As directed    Do not have sex or do anything that might make you have an orgasm   Complete by: As directed    Until threat of preterm labor has passed   Notify physician for a general feeling that "something is not right"   Complete by: As directed    Notify physician for increase or change in vaginal discharge   Complete by: As directed    Notify physician for intestinal cramps, with or without diarrhea, sometimes described as "gas pain"   Complete by: As directed    Notify physician for leaking of fluid   Complete by: As directed    Notify physician for low, dull backache, unrelieved by heat or Tylenol   Complete by: As directed    Notify physician for menstrual like cramps   Complete by: As directed    Notify physician for pelvic pressure   Complete by: As directed    Notify physician for uterine contractions.  These may be painless and feel like the uterus is tightening or the baby is  "balling up"   Complete by: As directed    Notify physician for vaginal bleeding   Complete by: As directed    PRETERM LABOR:  Includes any of the follwing symptoms that occur between 20 - [redacted] weeks gestation.  If these symptoms are not stopped, preterm labor can result in preterm delivery, placing your baby at risk   Complete by: As directed      Allergies as of 05/17/2019      Reactions   Reglan [metoclopramide] Other (See Comments)   Hot flash, unpleasant feeling      Medication List    STOP  taking these medications   albuterol 108 (90 Base) MCG/ACT inhaler Commonly known as: Ventolin HFA     TAKE these medications   famotidine 20 MG tablet Commonly known as: PEPCID Take 1 tablet (20 mg total) by mouth 2 (two) times daily.   Prenate Mini 18-0.6-0.4-350 MG Caps Take 1 capsule by mouth daily.   sertraline 50 MG tablet Commonly known as: ZOLOFT Take 1 tablet (50 mg total) by mouth daily.      [place]. Go to.   Specialty:  Obstetrics and Gynecology Why: scheduled prenatal appointment Contact information: [address] [place] [id number] [phone]          Total time spent taking care of this patient: 20 minutes  Signed: Rod Can, CNM  05/17/2019, 10:52 AM

## 2019-05-17 NOTE — Discharge Summary (Signed)
RN reviewed discharge instructions and follow-up care with patient. Gave pat opportunity for questions. All questions answered at this time. Pt verbalized understanding. Pt discharged home with her significant other.

## 2019-05-18 ENCOUNTER — Ambulatory Visit (INDEPENDENT_AMBULATORY_CARE_PROVIDER_SITE_OTHER): Payer: Medicaid Other | Admitting: Certified Nurse Midwife

## 2019-05-18 ENCOUNTER — Other Ambulatory Visit: Payer: Self-pay

## 2019-05-18 VITALS — BP 98/54 | Wt 117.0 lb

## 2019-05-18 DIAGNOSIS — O4693 Antepartum hemorrhage, unspecified, third trimester: Secondary | ICD-10-CM

## 2019-05-18 DIAGNOSIS — Z3A33 33 weeks gestation of pregnancy: Secondary | ICD-10-CM

## 2019-05-18 DIAGNOSIS — O368130 Decreased fetal movements, third trimester, not applicable or unspecified: Secondary | ICD-10-CM

## 2019-05-18 DIAGNOSIS — O0993 Supervision of high risk pregnancy, unspecified, third trimester: Secondary | ICD-10-CM

## 2019-05-18 DIAGNOSIS — Z3689 Encounter for other specified antenatal screening: Secondary | ICD-10-CM

## 2019-05-18 DIAGNOSIS — Z3493 Encounter for supervision of normal pregnancy, unspecified, third trimester: Secondary | ICD-10-CM

## 2019-05-18 LAB — FETAL NONSTRESS TEST

## 2019-05-18 LAB — CULTURE, BETA STREP (GROUP B ONLY)

## 2019-05-18 LAB — POCT URINALYSIS DIPSTICK OB

## 2019-05-18 NOTE — Progress Notes (Signed)
ROB at 33wk2d: Hospitalized 4/18-4/19 for vaginal bleeding and threatened preterm labor. Kleihauer Betke was positive indicating a placental abruption. Cervix changed from 2.5cm to 3 cm in dilation and made no further change She received Betamethasone x 2 doses. Fetal heart tracing remained reassuring She presents today for follow up with concerns for some decreased fetal movement today.Baby "not moving as much" No further bleeding. Contractions, mild and infrequent.  NST reactive with baseline 140 and accelerations to 160, moderate variability Baby moved 11 times in 30 minutes Bedside ultrasound: AFI=10.8cm. Cephalic presentation. Placenta fundal/posterior  A: IUP at 33wk2d with reactive NST Normal AFI Probable partial placental abruption based upon positive KB-no current bleeding  P: RTO 1 week for growth scan, NST, ROB [facility] instructions Per DR Staebler-Induction at 37 weeks indicated due to abruption if reassuring antepartum testing.  Dalia Heading, CNM

## 2019-05-23 ENCOUNTER — Encounter: Payer: Self-pay | Admitting: Obstetrics & Gynecology

## 2019-05-23 ENCOUNTER — Other Ambulatory Visit: Payer: Self-pay

## 2019-05-23 ENCOUNTER — Observation Stay
Admission: EM | Admit: 2019-05-23 | Discharge: 2019-05-23 | Disposition: A | Discharge status: Home / Self Care | Payer: Medicaid Other | Attending: Obstetrics & Gynecology | Admitting: Obstetrics & Gynecology

## 2019-05-23 DIAGNOSIS — O4703 False labor before 37 completed weeks of gestation, third trimester: Secondary | ICD-10-CM

## 2019-05-23 DIAGNOSIS — O99891 Other specified diseases and conditions complicating pregnancy: Secondary | ICD-10-CM | POA: Diagnosis not present

## 2019-05-23 DIAGNOSIS — Z888 Allergy status to other drugs, medicaments and biological substances status: Secondary | ICD-10-CM | POA: Diagnosis not present

## 2019-05-23 DIAGNOSIS — R319 Hematuria, unspecified: Secondary | ICD-10-CM | POA: Diagnosis present

## 2019-05-23 DIAGNOSIS — O99343 Other mental disorders complicating pregnancy, third trimester: Secondary | ICD-10-CM | POA: Insufficient documentation

## 2019-05-23 DIAGNOSIS — F329 Major depressive disorder, single episode, unspecified: Secondary | ICD-10-CM | POA: Insufficient documentation

## 2019-05-23 DIAGNOSIS — O99513 Diseases of the respiratory system complicating pregnancy, third trimester: Secondary | ICD-10-CM | POA: Diagnosis not present

## 2019-05-23 DIAGNOSIS — F419 Anxiety disorder, unspecified: Secondary | ICD-10-CM | POA: Insufficient documentation

## 2019-05-23 DIAGNOSIS — J45909 Unspecified asthma, uncomplicated: Secondary | ICD-10-CM | POA: Insufficient documentation

## 2019-05-23 DIAGNOSIS — Z3A34 34 weeks gestation of pregnancy: Secondary | ICD-10-CM | POA: Insufficient documentation

## 2019-05-23 DIAGNOSIS — N3001 Acute cystitis with hematuria: Secondary | ICD-10-CM | POA: Diagnosis not present

## 2019-05-23 DIAGNOSIS — N390 Urinary tract infection, site not specified: Secondary | ICD-10-CM | POA: Diagnosis present

## 2019-05-23 DIAGNOSIS — Z79899 Other long term (current) drug therapy: Secondary | ICD-10-CM | POA: Insufficient documentation

## 2019-05-23 DIAGNOSIS — O2343 Unspecified infection of urinary tract in pregnancy, third trimester: Principal | ICD-10-CM | POA: Insufficient documentation

## 2019-05-23 LAB — URINALYSIS, MICROSCOPIC (REFLEX)
Bacteria, UA: NONE SEEN
RBC / HPF: >50 RBC/hpf (ref 0–5)

## 2019-05-23 LAB — URINALYSIS, ROUTINE W REFLEX MICROSCOPIC
Bilirubin Urine: NEGATIVE
Glucose, UA: 250 mg/dL — AB
Ketones, ur: NEGATIVE mg/dL
Leukocytes,Ua: NEGATIVE
Nitrite: NEGATIVE
Protein, ur: 100 mg/dL — AB
Specific Gravity, Urine: >1.03 — ABNORMAL HIGH (ref 1.005–1.030)
pH: 6 (ref 5.0–8.0)

## 2019-05-23 LAB — FETAL FIBRONECTIN: Fetal Fibronectin: NEGATIVE

## 2019-05-23 MED ORDER — LIDOCAINE HCL (PF) 1 % IJ SOLN: 30.0000 mL | INTRAMUSCULAR | Status: DC | PRN

## 2019-05-23 MED ORDER — ONDANSETRON HCL 4 MG/2ML IJ SOLN: 4.0000 mg | Freq: Four times a day (QID) | INTRAMUSCULAR | Status: DC | PRN

## 2019-05-23 MED ORDER — NITROFURANTOIN MONOHYD MACRO 100 MG PO CAPS: 100.0000 mg | ORAL_CAPSULE | Freq: Two times a day (BID) | ORAL | 0 refills | Status: DC

## 2019-05-23 MED ORDER — ACETAMINOPHEN 325 MG PO TABS: 650.0000 mg | ORAL_TABLET | ORAL | Status: DC | PRN

## 2019-05-23 MED ORDER — NITROFURANTOIN MONOHYD MACRO 100 MG PO CAPS
100.0000 mg | ORAL_CAPSULE | Freq: Two times a day (BID) | ORAL | Status: DC
  Administered 2019-05-23: 10:00:00 100 mg via ORAL
  Filled 2019-05-23: qty 1

## 2019-05-23 NOTE — H&P (Signed)
Obstetrics Admission History & Physical   CC: blood in urine  HPI:  29 y.o. G2P1001 @ [redacted]w[redacted]d (07/04/2019, by Last Menstrual Period). Admitted on 05/23/2019:   Patient Active Problem List   Diagnosis Date Noted  . Hematuria 05/23/2019  . Threatened preterm labor, third trimester 05/17/2019  . Vaginal bleeding in pregnancy, third trimester 05/16/2019  . Anxiety 03/09/2019  . Hyperemesis 11/14/2018  . Supervision of low-risk pregnancy 11/13/2018  . Asthma affecting pregnancy, antepartum 11/13/2018  . Depression 11/13/2018  . Endometriosis 08/26/2018  . LLQ pain 12/18/2017  . Mastalgia 04/01/2017  . Breast mass in female 03/07/2017    Presents for noticing blood in urine this am.  She reports some irreg ctx type  Pains during the night prior.  She was seen last week and treated for some vaginal bleeding as well as cervical change. She received BMZ x2 and stabilized and has been on bed rest outpatient management.  Prior NSVD without any similar problems.  Denies current ROM.  Prenatal care at: at [facility]. Pregnancy complicated by none, until the last week as described.  Concern for possible chronic abruption, see prior notes..  ROS: A review of systems was performed and negative, except as stated in the above HPI. PMHx:  Past Medical History:  Diagnosis Date  . Anxiety   . Asthma   . Depression    PSHx:  Past Surgical History:  Procedure Laterality Date  . BREAST BIOPSY    . TONSILLECTOMY    . WISDOM TOOTH EXTRACTION Bilateral    Medications:  Medications Prior to Admission  Medication Sig Dispense Refill Last Dose  . albuterol (ACCUNEB) 1.25 MG/3ML nebulizer solution Take 1 ampule by nebulization every 6 (six) hours as needed for wheezing or shortness of breath.   Past Month at Unknown time  . famotidine (PEPCID) 20 MG tablet Take 1 tablet (20 mg total) by mouth 2 (two) times daily. 60 tablet 3 05/22/2019 at Unknown time  . Prenat-FeCbn-FeAsp-Meth-FA-DHA (PRENATE MINI)  18-0.6-0.4-350 MG CAPS Take 1 capsule by mouth daily. 30 capsule 11 05/22/2019 at Unknown time  . sertraline (ZOLOFT) 50 MG tablet Take 1 tablet (50 mg total) by mouth daily. 30 tablet 5 05/22/2019 at Unknown time   Allergies: is allergic to reglan [metoclopramide]. OBHx:  OB History  Gravida Para Term Preterm AB Living  2 1 1     1   SAB TAB Ectopic Multiple Live Births        0 1    # Outcome Date GA Lbr Len/2nd Weight Sex Delivery Anes PTL Lv  2 Current           1 Term 07/10/14 [redacted]w[redacted]d 15:40 / 00:53 3140 g M Vag-Spont EPI  LIV    Obstetric Comments  1st Menstrual Cycle:  13  1st Pregnancy:  23   [id number] except as detailed in HPI.Marland Kitchen  No family history of birth defects. Soc Hx: Alcohol: none and Recreational drug use: none  Objective:   Vitals:   05/23/19 0833  BP: 104/68  Pulse: 93   Constitutional: Well nourished, well developed female in no acute distress.  HEENT: normal Skin: Warm and dry.  Cardiovascular:Regular rate and rhythm.   Extremity: trace to 1+ bilateral pedal edema Respiratory: Clear to auscultation bilateral. Normal respiratory effort Abdomen: gravid, ND, FHT present, mild tenderness on exam Back: no CVAT Neuro: DTRs 2+, Cranial nerves grossly intact Psych: Alert and Oriented x3. No memory deficits. Normal mood and affect.  MS: normal gait, normal bilateral lower  extremity ROM/strength/stability.  Pelvic exam: is not limited by body habitus EGBUS: within normal limits Vagina: within normal limits and with normal mucosa Cervix: CERVIX: 3 cm dilated, 60 effaced, -2 station, presenting part Vtx very low Uterus: No contractions observed for 30+ minutes.  Adnexa: not evaluated  EFM:FHR: 140 bpm, variability: moderate,  accelerations:  Present,  decelerations:  Absent Toco: None  A NST procedure was performed with FHR monitoring and a normal baseline established, appropriate time of 20-40 minutes of evaluation, and accels >2 seen w 15x15  characteristics.  Results show a REACTIVE NST.    Perinatal info:  Blood type: A positive Rubella- Immune Varicella -Immune TDaP Given during third trimester of this pregnancy RPR NR / HIV Neg/ HBsAg Neg   Assessment & Plan:   29 y.o. G2P1001 @ [redacted]w[redacted]d, Admitted on 05/23/2019: Hematuria.  Monitor for PTL or vaginal bleeding (none on glove or exam today)  UA fFN EFM  Stable exam from last week, no s/sx PTL at this time, nor abruption.  Barnett Applebaum, MD, [facility], [facility] 05/23/2019  9:09 AM

## 2019-05-23 NOTE — Discharge Instructions (Signed)
Pregnancy and Urinary Tract Infection  A urinary tract infection (UTI) is an infection of any part of the urinary tract. This includes the kidneys, the tubes that connect your kidneys to your bladder (ureters), the bladder, and the tube that carries urine out of your body (urethra). These organs make, store, and get rid of urine in the body. Your health care provider may use other names to describe the infection. An upper UTI affects the ureters and kidneys (pyelonephritis). A lower UTI affects the bladder (cystitis) and urethra (urethritis). Most urinary tract infections are caused by bacteria in your genital area, around the entrance to your urinary tract (urethra). These bacteria grow and cause irritation and inflammation of your urinary tract. You are more likely to develop a UTI during pregnancy because the physical and hormonal changes your body goes through can make it easier for bacteria to get into your urinary tract. Your growing baby also puts pressure on your bladder and can affect urine flow. It is important to recognize and treat UTIs in pregnancy because of the risk of serious complications for both you and your baby. How does this affect me? Symptoms of a UTI include:  Needing to urinate right away (urgently).  Frequent urination or passing small amounts of urine frequently.  Pain or burning with urination.  Blood in the urine.  Urine that smells bad or unusual.  Trouble urinating.  Cloudy urine.  Pain in the abdomen or lower back.  Vaginal discharge. You may also have:  Vomiting or a decreased appetite.  Confusion.  Irritability or tiredness.  A fever.  Diarrhea. How does this affect my baby? An untreated UTI during pregnancy could lead to a kidney infection or a systemic infection, which can cause health problems that could affect your baby. Possible complications of an untreated UTI include:  Giving birth to your baby before 37 weeks of pregnancy  (premature).  Having a baby with a low birth weight.  Developing high blood pressure during pregnancy (preeclampsia).  Having a low hemoglobin level (anemia). What can I do to lower my risk? To prevent a UTI:  Go to the bathroom as soon as you feel the need. Do not hold urine for long periods of time.  Always wipe from front to back, especially after a bowel movement. Use each tissue one time when you wipe.  Empty your bladder after sex.  Keep your genital area dry.  Drink 6-10 glasses of water each day.  Do not douche or use deodorant sprays. How is this treated? Treatment for this condition may include:  Antibiotic medicines that are safe to take during pregnancy.  Other medicines to treat less common causes of UTI. Follow these instructions at home:  If you were prescribed an antibiotic medicine, take it as told by your health care provider. Do not stop using the antibiotic even if you start to feel better.  Keep all follow-up visits as told by your health care provider. This is important. Contact a health care provider if:  Your symptoms do not improve or they get worse.  You have abnormal vaginal discharge. Get help right away if you:  Have a fever.  Have nausea and vomiting.  Have back or side pain.  Feel contractions in your uterus.  Have lower belly pain.  Have a gush of fluid from your vagina.  Have blood in your urine. Summary  A urinary tract infection (UTI) is an infection of any part of the urinary tract, which includes the  kidneys, ureters, bladder, and urethra.  Most urinary tract infections are caused by bacteria in your genital area, around the entrance to your urinary tract (urethra).  You are more likely to develop a UTI during pregnancy.  If you were prescribed an antibiotic medicine, take it as told by your health care provider. Do not stop using the antibiotic even if you start to feel better. This information is not intended to  replace advice given to you by your health care provider. Make sure you discuss any questions you have with your health care provider. Document Revised: 05/08/2018 Document Reviewed: 12/18/2017 Elsevier Patient Education  [place]. Nitrofurantoin tablets or capsules What is this medicine? NITROFURANTOIN (nye troe fyoor AN toyn) is an antibiotic. It is used to treat urinary tract infections. This medicine may be used for other purposes; ask your health care provider or pharmacist if you have questions. COMMON BRAND NAME(S): Macrobid, Macrodantin, Urotoin What should I tell my health care provider before I take this medicine? They need to know if you have any of these conditions:  anemia  diabetes  glucose-6-phosphate dehydrogenase deficiency  kidney disease  liver disease  lung disease  other chronic illness  an unusual or allergic reaction to nitrofurantoin, other antibiotics, other medicines, foods, dyes or preservatives  pregnant or trying to get pregnant  breast-feeding How should I use this medicine? Take this medicine by mouth with a glass of water. Follow the directions on the prescription label. Take this medicine with food or milk. Take your doses at regular intervals. Do not take your medicine more often than directed. Do not stop taking except on your doctor's advice. Talk to your pediatrician regarding the use of this medicine in children. While this drug may be prescribed for selected conditions, precautions do apply. Overdosage: If you think you have taken too much of this medicine contact a poison control center or emergency room at once. NOTE: This medicine is only for you. Do not share this medicine with others. What if I miss a dose? If you miss a dose, take it as soon as you can. If it is almost time for your next dose, take only that dose. Do not take double or extra doses. What may interact with this medicine?  antacids containing magnesium  trisilicate  probenecid  quinolone antibiotics like ciprofloxacin, lomefloxacin, norfloxacin and ofloxacin  sulfinpyrazone This list may not describe all possible interactions. Give your health care provider a list of all the medicines, herbs, non-prescription drugs, or dietary supplements you use. Also tell them if you smoke, drink alcohol, or use illegal drugs. Some items may interact with your medicine. What should I watch for while using this medicine? Tell your doctor or health care professional if your symptoms do not improve or if you get new symptoms. Drink several glasses of water a day. If you are taking this medicine for a long time, visit your doctor for regular checks on your progress. If you are diabetic, you may get a false positive result for sugar in your urine with certain brands of urine tests. Check with your doctor. What side effects may I notice from receiving this medicine? Side effects that you should report to your doctor or health care professional as soon as possible:  allergic reactions like skin rash or hives, swelling of the face, lips, or tongue  chest pain  cough  difficulty breathing  dizziness, drowsiness  fever or infection  joint aches or pains  pale or blue-tinted skin  redness, blistering, peeling or loosening of the skin, including inside the mouth  tingling, burning, pain, or numbness in hands or feet  unusual bleeding or bruising  unusually weak or tired  yellowing of eyes or skin Side effects that usually do not require medical attention (report to your doctor or health care professional if they continue or are bothersome):  dark urine  diarrhea  headache  loss of appetite  nausea or vomiting  temporary hair loss This list may not describe all possible side effects. Call your doctor for medical advice about side effects. You may report side effects to FDA at 1-800-FDA-1088. Where should I keep my medicine? Keep out of the  reach of children. Store at room temperature between 15 and 30 degrees C (59 and 86 degrees F). Protect from light. Throw away any unused medicine after the expiration date. NOTE: This sheet is a summary. It may not cover all possible information. If you have questions about this medicine, talk to your doctor, pharmacist, or health care provider.  2020 Elsevier/Gold Standard (2007-08-05 15:56:47)

## 2019-05-23 NOTE — Progress Notes (Signed)
Patient discharged to home after results of UA and FFN. Patient given first dose of macrobid which was scanned and documented. Discharge instructions gone over with patient and spouse who verbalized understanding of plan and when to return to the hospital.

## 2019-05-23 NOTE — Discharge Summary (Signed)
Physician Discharge Summary  Patient ID: DIA LUC MRN: [id number] DOB/AGE: 1990/09/25 29 y.o.  Admit date: 05/23/2019 Discharge date: 05/23/2019  Admission Diagnoses: Hematuria   UTI (urinary tract infection)   34 weeks pregnancy  Discharge Diagnoses:  Active Problems:   Hematuria   UTI (urinary tract infection)  Discharged Condition: good  Hospital Course: Pt seen and examined, labs done.  See H&P for details.  No s/x PTL or abruption. FWB reassuring.  UTI diagnosed, treated w Macrobid.  Consults: None  Significant Diagnostic Studies: labs:  Results for orders placed or performed during the hospital encounter of 05/23/19  Urinalysis, Routine w reflex microscopic  Result Value Ref Range   Color, Urine YELLOW YELLOW   APPearance CLOUDY (A) CLEAR   Specific Gravity, Urine >1.030 (H) 1.005 - 1.030   pH 6.0 5.0 - 8.0   Glucose, UA 250 (A) NEGATIVE mg/dL   Hgb urine dipstick LARGE (A) NEGATIVE   Bilirubin Urine NEGATIVE NEGATIVE   Ketones, ur NEGATIVE NEGATIVE mg/dL   Protein, ur 100 (A) NEGATIVE mg/dL   Nitrite NEGATIVE NEGATIVE   Leukocytes,Ua NEGATIVE NEGATIVE  Fetal fibronectin  Result Value Ref Range   Fetal Fibronectin NEGATIVE NEGATIVE  Urinalysis, Microscopic (reflex)  Result Value Ref Range   RBC / HPF >50 0 - 5 RBC/hpf   WBC, UA 21-50 0 - 5 WBC/hpf   Bacteria, UA NONE SEEN NONE SEEN   Squamous Epithelial / LPF 6-10 0 - 5   Mucus PRESENT     and   A NST procedure was performed with FHR monitoring and a normal baseline established, appropriate time of 20-40 minutes of evaluation, and accels >2 seen w 15x15 characteristics.  Results show a REACTIVE NST.   Treatments: antibiotics: Macrobid  Discharge Exam: Blood pressure 104/68, pulse 93, last menstrual period 09/27/2018, currently breastfeeding. No changes  Disposition: Discharge disposition: 01-Home or Self Care       Discharge Instructions    Call MD for:   Complete by: As directed    Worsening contractions or pain; leakage of fluid; bleeding.   Diet general   Complete by: As directed    Increase activity slowly   Complete by: As directed      Allergies as of 05/23/2019      Reactions   Reglan [metoclopramide] Other (See Comments)   Hot flash, unpleasant feeling      Medication List    TAKE these medications   albuterol 1.25 MG/3ML nebulizer solution Commonly known as: ACCUNEB Take 1 ampule by nebulization every 6 (six) hours as needed for wheezing or shortness of breath.   famotidine 20 MG tablet Commonly known as: PEPCID Take 1 tablet (20 mg total) by mouth 2 (two) times daily.   nitrofurantoin (macrocrystal-monohydrate) 100 MG capsule Commonly known as: MACROBID Take 1 capsule (100 mg total) by mouth every 12 (twelve) hours.   Prenate Mini 18-0.6-0.4-350 MG Caps Take 1 capsule by mouth daily.   sertraline 50 MG tablet Commonly known as: ZOLOFT Take 1 tablet (50 mg total) by mouth daily.        Signed: Hoyt Koch 05/23/2019, 10:01 AM

## 2019-05-23 NOTE — OB Triage Note (Signed)
Patient here for blood tinged urine this morning. She states that she has some "annoying contractions" that were not painful during the night. Denies LOF , denies pain or burning with urination. Monitors applied continuing to assess.

## 2019-05-25 ENCOUNTER — Ambulatory Visit (INDEPENDENT_AMBULATORY_CARE_PROVIDER_SITE_OTHER): Payer: Medicaid Other

## 2019-05-25 ENCOUNTER — Ambulatory Visit (INDEPENDENT_AMBULATORY_CARE_PROVIDER_SITE_OTHER): Payer: Medicaid Other | Admitting: Obstetrics & Gynecology

## 2019-05-25 ENCOUNTER — Encounter: Payer: Self-pay | Admitting: Obstetrics & Gynecology

## 2019-05-25 ENCOUNTER — Other Ambulatory Visit: Payer: Self-pay

## 2019-05-25 VITALS — BP 100/60 | Wt 115.0 lb

## 2019-05-25 DIAGNOSIS — Z3A34 34 weeks gestation of pregnancy: Secondary | ICD-10-CM

## 2019-05-25 DIAGNOSIS — O4693 Antepartum hemorrhage, unspecified, third trimester: Secondary | ICD-10-CM | POA: Diagnosis not present

## 2019-05-25 DIAGNOSIS — O0993 Supervision of high risk pregnancy, unspecified, third trimester: Secondary | ICD-10-CM

## 2019-05-25 DIAGNOSIS — Z3A35 35 weeks gestation of pregnancy: Secondary | ICD-10-CM | POA: Diagnosis not present

## 2019-05-25 NOTE — Progress Notes (Signed)
  Subjective  Fetal Movement? yes Contractions? no Leaking Fluid? no Vaginal Bleeding? no Seen in L&D over weekend, dx w UTI  No further bleeding, no ctx type pains   Pt has diagnosis of chronic abruption, on modified bed rest and weekly APT Objective  BP 100/60   Wt 115 lb (52.2 kg)   LMP 09/27/2018 (Exact Date)   BMI 19.74 kg/m  General: NAD Pumonary: no increased work of breathing Abdomen: gravid, non-tender Extremities: no edema Psychiatric: mood appropriate, affect full  Review of ULTRASOUND.    I have personally reviewed images and report of recent ultrasound done at [facility].    Plan of management to be discussed with patient. AFI 7 Vtx No placental abnormalities visualized in this limited exam  Assessment  29 y.o. G2P1001 at [redacted]w[redacted]d by  07/04/2019, by Last Menstrual Period presenting for routine prenatal visit  Plan   Problem List Items Addressed This Visit    [redacted] weeks gestation of pregnancy    -  Primary   Supervision of high risk pregnancy in third trimester   Chronic Abruption     Relevant Orders   US OB Limited Discussed IOL 37 weeks based on diagnosis, pros and cons of waiting beyond 37 weeks vs delivery/induction/prematurity Scheduled for May 17 Cont APT weekly      Pregnancy #2 Problems (from 09/27/18 to present)    Problem Noted Resolved   Threatened preterm labor, third trimester 05/17/2019 by Rod Can, CNM No   Supervision of low-risk pregnancy 11/13/2018 by Dalia Heading, CNM No   Overview Addendum 05/04/2019  4:41 PM by Homero Fellers, [place]  Dating By LMP c/w 7w u/s Blood type: A/Positive/-- (10/09 1458)   Genetic Screen NIPS: Normal Female. MSAFP: negative Antibody:Negative (10/09 1458)  Anatomic [place] 19 wks WNL Rubella: 3.25 (10/09 1458) Varicella: Immune  GTT 28 wk: 146 3hr- all nml values RPR: Non Reactive (10/09 1458)   Rhogam NA HBsAg: Negative (10/09 1458)   Vaccines TDAP: 04/20/2019                   Flu Shot: 11/06/2018 HIV: Non Reactive (10/09 1458)   Baby Food Breast- nursed son for 10 months                             GBS:   Contraception IUD Pap:10/2018  CBB  No   CS/VBAC n/a   Support Person Husband Gweneth Dimitri, MD, [facility], [facility] 05/25/2019  11:28 AM

## 2019-06-03 ENCOUNTER — Ambulatory Visit (INDEPENDENT_AMBULATORY_CARE_PROVIDER_SITE_OTHER): Payer: Medicaid Other | Admitting: Advanced Practice Midwife

## 2019-06-03 ENCOUNTER — Encounter: Payer: Self-pay | Admitting: Advanced Practice Midwife

## 2019-06-03 ENCOUNTER — Ambulatory Visit (INDEPENDENT_AMBULATORY_CARE_PROVIDER_SITE_OTHER): Payer: Medicaid Other

## 2019-06-03 ENCOUNTER — Other Ambulatory Visit: Payer: Self-pay

## 2019-06-03 VITALS — BP 100/60 | Wt 118.0 lb

## 2019-06-03 DIAGNOSIS — O0993 Supervision of high risk pregnancy, unspecified, third trimester: Secondary | ICD-10-CM | POA: Diagnosis not present

## 2019-06-03 DIAGNOSIS — Z3493 Encounter for supervision of normal pregnancy, unspecified, third trimester: Secondary | ICD-10-CM | POA: Diagnosis not present

## 2019-06-03 DIAGNOSIS — Z3A35 35 weeks gestation of pregnancy: Secondary | ICD-10-CM | POA: Diagnosis not present

## 2019-06-03 DIAGNOSIS — O4593 Premature separation of placenta, unspecified, third trimester: Secondary | ICD-10-CM

## 2019-06-03 LAB — POCT URINALYSIS DIPSTICK OB: Glucose, UA: NEGATIVE

## 2019-06-03 NOTE — Patient Instructions (Signed)

## 2019-06-03 NOTE — Progress Notes (Signed)
Routine Prenatal Care Visit  Subjective  Jeanette Miranda is a 29 y.o. G2P1001 at [redacted]w[redacted]d being seen today for ongoing prenatal care.  She is currently monitored for the following issues for this low-risk pregnancy and has Breast mass in female; Mastalgia; LLQ pain; Endometriosis; Supervision of low-risk pregnancy; Asthma affecting pregnancy, antepartum; Depression; Hyperemesis; Anxiety; Vaginal bleeding in pregnancy, third trimester; Threatened preterm labor, third trimester; Hematuria; and UTI (urinary tract infection) on their problem list.  ----------------------------------------------------------------------------------- Patient reports no complaints.  We discussed scheduling of 37 week induction for chronic abruption since 33 weeks. Contractions: Irregular. Vag. Bleeding: None.  Movement: Present. Leaking Fluid denies.  ----------------------------------------------------------------------------------- The following portions of the patient's history were reviewed and updated as appropriate: allergies, current medications, past family history, past medical history, past social history, past surgical history and problem list. Problem list updated.  Objective  Blood pressure 100/60, weight 118 lb (53.5 kg), last menstrual period 09/27/2018, currently breastfeeding. Pregravid weight 110 lb (49.9 kg) Total Weight Gain 8 lb (3.629 kg) Urinalysis: Urine Protein Small (1+)  Urine Glucose Negative  Fetal Status: Fetal Heart Rate (bpm): 140   Movement: Present      NST: reactive 20 minute tracing: 140 bpm baseline, moderate variability, +accelerations to 170, -decelerations AFI: 6.7 cm  General:  Alert, oriented and cooperative. Patient is in no acute distress.  Skin: Skin is warm and dry. No rash noted.   Cardiovascular: Normal heart rate noted  Respiratory: Normal respiratory effort, no problems with respiration noted  Abdomen: Soft, gravid, appropriate for gestational age. Pain/Pressure:  Present     Pelvic:  Cervical exam deferred        Extremities: Normal range of motion.  Edema: None  Mental Status: Normal mood and affect. Normal behavior. Normal judgment and thought content.   Assessment   29 y.o. G2P1001 at [redacted]w[redacted]d by  07/04/2019, by Last Menstrual Period presenting for routine prenatal visit  Plan   Pregnancy #2 Problems (from 09/27/18 to present)    Problem Noted Resolved   Threatened preterm labor, third trimester 05/17/2019 by Rod Can, CNM No   Supervision of low-risk pregnancy 11/13/2018 by Dalia Heading, CNM No   Overview Addendum 05/04/2019  4:41 PM by Homero Fellers, [place] Prenatal Labs  Dating By LMP c/w 7w u/s Blood type: A/Positive/-- (10/09 1458)   Genetic Screen NIPS: Normal Female. MSAFP: negative Antibody:Negative (10/09 1458)  Anatomic [place] 19 wks WNL Rubella: 3.25 (10/09 1458) Varicella: Immune  GTT 28 wk: 146 3hr- all nml values RPR: Non Reactive (10/09 1458)   Rhogam NA HBsAg: Negative (10/09 1458)   Vaccines TDAP: 04/20/2019                  Flu Shot: 11/06/2018 HIV: Non Reactive (10/09 1458)   Baby Food Breast- nursed son for 10 months                             GBS:   Contraception IUD Pap:10/2018  CBB  No   CS/VBAC n/a   Support Person Husband Quillian Quince              Preterm labor symptoms and general obstetric precautions including but not limited to vaginal bleeding, contractions, leaking of fluid and fetal movement were reviewed in detail with the patient. Please refer to After Visit Summary for other counseling recommendations.   Return in about 1 week (around 06/10/2019) for afi/nst/rob.  Rod Can, CNM 06/03/2019 1:55 PM

## 2019-06-03 NOTE — H&P (Signed)
OB History & Physical   History of Present Illness:  Chief Complaint: induction of labor for chronic abruption  Date of initial H&P: 06/03/2019  HPI:  Jeanette Miranda is a 29 y.o. G2P1001 female at [redacted]w[redacted]d dated by LMP.  Her pregnancy has been complicated by chronic placental abruption in third trimester, threatened preterm labor third trimester.    She reports irregular contractions.   She denies leakage of fluid.   She denies vaginal bleeding.   She reports fetal movement.    Total weight gain for pregnancy: 8 lb (3.629 kg)   Obstetrical Problem List: Pregnancy #2 Problems (from 09/27/18 to present)    Problem Noted Resolved   Threatened preterm labor, third trimester 05/17/2019 by Rod Can, CNM No   Supervision of low-risk pregnancy 11/13/2018 by Dalia Heading, CNM No   Overview Addendum 05/04/2019  4:41 PM by Homero Fellers, [place] Prenatal Labs  Dating By LMP c/w 7w u/s Blood type: A/Positive/-- (10/09 1458)   Genetic Screen NIPS: Normal Female. MSAFP: negative Antibody:Negative (10/09 1458)  Anatomic [place] 19 wks WNL Rubella: 3.25 (10/09 1458) Varicella: Immune  GTT 28 wk: 146 3hr- all nml values RPR: Non Reactive (10/09 1458)   Rhogam NA HBsAg: Negative (10/09 1458)   Vaccines TDAP: 04/20/2019                  Flu Shot: 11/06/2018 HIV: Non Reactive (10/09 1458)   Baby Food Breast- nursed son for 10 months                             GBS: negative 4/18 GC/CT negative/negative  Contraception IUD Pap:10/2018 negative  CBB  No   CS/VBAC n/a   Support Person Husband Daniel              Maternal Medical History:   Past Medical History:  Diagnosis Date  . Anxiety   . Asthma   . Depression     Past Surgical History:  Procedure Laterality Date  . BREAST BIOPSY    . TONSILLECTOMY    . WISDOM TOOTH EXTRACTION Bilateral     Allergies  Allergen Reactions  . Reglan [Metoclopramide] Other (See Comments)    Hot flash, unpleasant feeling      Prior to Admission medications   Medication Sig Start Date End Date Taking? Authorizing Provider  albuterol (ACCUNEB) 1.25 MG/3ML nebulizer solution Take 1 ampule by nebulization every 6 (six) hours as needed for wheezing or shortness of breath.    [provider]  famotidine (PEPCID) 20 MG tablet Take 1 tablet (20 mg total) by mouth 2 (two) times daily. 03/09/19   Dalia Heading, CNM  nitrofurantoin, macrocrystal-monohydrate, (MACROBID) 100 MG capsule Take 1 capsule (100 mg total) by mouth every 12 (twelve) hours. 05/23/19   Gae Dry, MD  Prenat-FeCbn-FeAsp-Meth-FA-DHA (PRENATE MINI) 18-0.6-0.4-350 MG CAPS Take 1 capsule by mouth daily. 03/12/19   Gae Dry, MD  sertraline (ZOLOFT) 50 MG tablet Take 1 tablet (50 mg total) by mouth daily. 03/09/19   Dalia Heading, CNM    OB History  Gravida Para Term Preterm AB Living  2 1 1     1   SAB TAB Ectopic Multiple Live Births        0 1    # Outcome Date GA Lbr Len/2nd Weight Sex Delivery Anes PTL Lv  2 Current  1 Term 07/10/14 [redacted]w[redacted]d 15:40 / 00:53 6 lb 14.8 oz (3.14 kg) M Vag-Spont EPI  LIV    Obstetric Comments  1st Menstrual Cycle:  13  1st Pregnancy:  23    Prenatal care site: [facility]  Social History: She  reports that she has never smoked. She has never used smokeless tobacco. She reports that she does not drink alcohol or use drugs.  Family History: family history includes Crohn's disease in her mother.    Review of Systems:  Review of Systems  Constitutional: Negative for chills and fever.  HENT: Negative for congestion, ear discharge, ear pain, hearing loss, sinus pain and sore throat.   Eyes: Negative for blurred vision and double vision.  Respiratory: Negative for cough, shortness of breath and wheezing.   Cardiovascular: Negative for chest pain, palpitations and leg swelling.  Gastrointestinal: Negative for abdominal pain, blood in stool, constipation, diarrhea, heartburn,  melena, nausea and vomiting.  Genitourinary: Negative for dysuria, flank pain, frequency, hematuria and urgency.  Musculoskeletal: Negative for back pain, joint pain and myalgias.  Skin: Negative for itching and rash.  Neurological: Negative for dizziness, tingling, tremors, sensory change, speech change, focal weakness, seizures, loss of consciousness, weakness and headaches.  Endo/Heme/Allergies: Negative for environmental allergies. Does not bruise/bleed easily.  Psychiatric/Behavioral: Negative for depression, hallucinations, memory loss, substance abuse and suicidal ideas. The patient is not nervous/anxious and does not have insomnia.      Physical Exam:  BP 100/60   Wt 118 lb (53.5 kg)   LMP 09/27/2018 (Exact Date)   BMI 20.25 kg/m   Vital Signs: BP 100/60   Wt 118 lb (53.5 kg)   LMP 09/27/2018 (Exact Date)   BMI 20.25 kg/m  Constitutional: Well nourished, well developed female in no acute distress.  HEENT: normal Skin: Warm and dry.  Cardiovascular: Regular rate and rhythm.   Extremity: no edema  Respiratory: Clear to auscultation bilateral. Normal respiratory effort Abdomen: FHT present Back: no CVAT Neuro: DTRs 2+, Cranial nerves grossly intact Psych: Alert and Oriented x3. No memory deficits. Normal mood and affect.  MS: normal gait, normal bilateral lower extremity ROM/strength/stability.  Pelvic exam: deferred   Baseline FHR: 145 beats/min   Variability: moderate   Accelerations: present   Decelerations: absent  Covid test pending to be done 06/11/19  Lab Results  Component Value Date   [place] NEGATIVE 05/16/2019  ]  Assessment:  Jeanette Miranda is a 29 y.o. G45P1001 female at [redacted]w[redacted]d with 37 week induction for chronic placental abruption.   Plan:  1. Admit to Labor & Delivery  2. CBC, T&S, Clrs, IVF 3. GBS negative.   4. Fetal well-being: reassuring 5. Cervical exam on admission to determine method of induction   Rod Can, [facility] 06/03/2019 2:47  PM

## 2019-06-10 ENCOUNTER — Encounter: Payer: Self-pay | Admitting: Obstetrics and Gynecology

## 2019-06-10 ENCOUNTER — Other Ambulatory Visit: Payer: Self-pay

## 2019-06-10 ENCOUNTER — Inpatient Hospital Stay: Payer: Medicaid Other | Admitting: Anesthesiology

## 2019-06-10 ENCOUNTER — Inpatient Hospital Stay
Admission: EM | Admit: 2019-06-10 | Discharge: 2019-06-12 | DRG: 807 | Disposition: A | Discharge status: Home / Self Care | Payer: Medicaid Other | Attending: Obstetrics & Gynecology | Admitting: Obstetrics & Gynecology

## 2019-06-10 ENCOUNTER — Encounter: Payer: Medicaid Other | Admitting: Certified Nurse Midwife

## 2019-06-10 ENCOUNTER — Other Ambulatory Visit: Payer: Medicaid Other

## 2019-06-10 DIAGNOSIS — O99344 Other mental disorders complicating childbirth: Secondary | ICD-10-CM | POA: Diagnosis present

## 2019-06-10 DIAGNOSIS — Z20822 Contact with and (suspected) exposure to covid-19: Secondary | ICD-10-CM | POA: Diagnosis present

## 2019-06-10 DIAGNOSIS — Z3A36 36 weeks gestation of pregnancy: Secondary | ICD-10-CM

## 2019-06-10 DIAGNOSIS — J45909 Unspecified asthma, uncomplicated: Secondary | ICD-10-CM | POA: Diagnosis present

## 2019-06-10 DIAGNOSIS — F329 Major depressive disorder, single episode, unspecified: Secondary | ICD-10-CM | POA: Diagnosis present

## 2019-06-10 DIAGNOSIS — O9952 Diseases of the respiratory system complicating childbirth: Secondary | ICD-10-CM | POA: Diagnosis present

## 2019-06-10 DIAGNOSIS — O4593 Premature separation of placenta, unspecified, third trimester: Principal | ICD-10-CM | POA: Diagnosis present

## 2019-06-10 DIAGNOSIS — O4703 False labor before 37 completed weeks of gestation, third trimester: Secondary | ICD-10-CM | POA: Diagnosis present

## 2019-06-10 DIAGNOSIS — O9902 Anemia complicating childbirth: Secondary | ICD-10-CM | POA: Diagnosis present

## 2019-06-10 DIAGNOSIS — O26893 Other specified pregnancy related conditions, third trimester: Secondary | ICD-10-CM | POA: Diagnosis present

## 2019-06-10 LAB — TYPE AND SCREEN
ABO/RH(D): A POS
Antibody Screen: NEGATIVE

## 2019-06-10 LAB — CBC
HCT: 33.7 % — ABNORMAL LOW (ref 36.0–46.0)
Hemoglobin: 11.7 g/dL — ABNORMAL LOW (ref 12.0–15.0)
MCH: 30.6 pg (ref 26.0–34.0)
MCHC: 34.7 g/dL (ref 30.0–36.0)
MCV: 88.2 fL (ref 80.0–100.0)
Platelets: 152 10*3/uL (ref 150–400)
RBC: 3.82 MIL/uL — ABNORMAL LOW (ref 3.87–5.11)
RDW: 12.6 % (ref 11.5–15.5)
WBC: 8.2 10*3/uL (ref 4.0–10.5)
nRBC: 0 % (ref 0.0–0.2)

## 2019-06-10 LAB — KLEIHAUER-BETKE STAIN
Fetal Cells %: 0 %
Quantitation Fetal Hemoglobin: 0 mL

## 2019-06-10 LAB — SARS CORONAVIRUS 2 BY RT PCR (HOSPITAL ORDER, PERFORMED IN ~~LOC~~ HOSPITAL LAB): SARS Coronavirus 2: NEGATIVE

## 2019-06-10 LAB — CHLAMYDIA/NGC RT PCR (ARMC ONLY)
Chlamydia Tr: NOT DETECTED
N gonorrhoeae: NOT DETECTED

## 2019-06-10 MED ORDER — SODIUM CHLORIDE 0.9 % IV SOLN: 250.0000 mL | INTRAVENOUS | Status: DC | PRN

## 2019-06-10 MED ORDER — SIMETHICONE 80 MG PO CHEW: 80.0000 mg | CHEWABLE_TABLET | ORAL | Status: DC | PRN

## 2019-06-10 MED ORDER — DIPHENHYDRAMINE HCL 50 MG/ML IJ SOLN: 12.5000 mg | INTRAMUSCULAR | Status: DC | PRN

## 2019-06-10 MED ORDER — MISOPROSTOL 25 MCG QUARTER TABLET: 25.0000 ug | ORAL_TABLET | ORAL | Status: DC | PRN

## 2019-06-10 MED ORDER — SODIUM CHLORIDE 0.9% FLUSH: 3.0000 mL | Freq: Two times a day (BID) | INTRAVENOUS | Status: DC

## 2019-06-10 MED ORDER — LIDOCAINE HCL (PF) 1 % IJ SOLN
INTRAMUSCULAR | Status: AC
  Filled 2019-06-10: qty 30

## 2019-06-10 MED ORDER — OXYTOCIN 40 UNITS IN NORMAL SALINE INFUSION - SIMPLE MED
2.5000 [IU]/h | INTRAVENOUS | Status: DC
  Administered 2019-06-10: 2.5 [IU]/h via INTRAVENOUS
  Filled 2019-06-10: qty 1000

## 2019-06-10 MED ORDER — OXYTOCIN BOLUS FROM INFUSION
500.0000 mL | Freq: Once | INTRAVENOUS | Status: AC
  Administered 2019-06-10: 500 mL via INTRAVENOUS

## 2019-06-10 MED ORDER — LACTATED RINGERS IV SOLN: INTRAVENOUS | Status: DC

## 2019-06-10 MED ORDER — FENTANYL 2.5 MCG/ML W/ROPIVACAINE 0.15% IN NS 100 ML EPIDURAL (ARMC)
12.0000 mL/h | EPIDURAL | Status: DC
  Administered 2019-06-10: 12 mL/h via EPIDURAL

## 2019-06-10 MED ORDER — OXYCODONE-ACETAMINOPHEN 5-325 MG PO TABS: 2.0000 | ORAL_TABLET | ORAL | Status: DC | PRN

## 2019-06-10 MED ORDER — ACETAMINOPHEN 325 MG PO TABS
650.0000 mg | ORAL_TABLET | ORAL | Status: DC | PRN
  Administered 2019-06-11 (×2): 650 mg via ORAL
  Filled 2019-06-10 (×2): qty 2

## 2019-06-10 MED ORDER — BENZOCAINE-MENTHOL 20-0.5 % EX AERO: 1.0000 "application " | INHALATION_SPRAY | CUTANEOUS | Status: DC | PRN

## 2019-06-10 MED ORDER — OXYCODONE-ACETAMINOPHEN 5-325 MG PO TABS: 1.0000 | ORAL_TABLET | ORAL | Status: DC | PRN

## 2019-06-10 MED ORDER — EPHEDRINE 5 MG/ML INJ: 10.0000 mg | INTRAVENOUS | Status: DC | PRN

## 2019-06-10 MED ORDER — LACTATED RINGERS IV SOLN: 500.0000 mL | INTRAVENOUS | Status: DC | PRN

## 2019-06-10 MED ORDER — MISOPROSTOL 200 MCG PO TABS
ORAL_TABLET | ORAL | Status: AC
  Filled 2019-06-10: qty 4

## 2019-06-10 MED ORDER — ONDANSETRON HCL 4 MG/2ML IJ SOLN: 4.0000 mg | Freq: Four times a day (QID) | INTRAMUSCULAR | Status: DC | PRN

## 2019-06-10 MED ORDER — ONDANSETRON HCL 4 MG PO TABS: 4.0000 mg | ORAL_TABLET | ORAL | Status: DC | PRN

## 2019-06-10 MED ORDER — SENNOSIDES-DOCUSATE SODIUM 8.6-50 MG PO TABS
2.0000 | ORAL_TABLET | ORAL | Status: DC
  Administered 2019-06-11: 2 via ORAL
  Filled 2019-06-10: qty 2

## 2019-06-10 MED ORDER — PHENYLEPHRINE 40 MCG/ML (10ML) SYRINGE FOR IV PUSH (FOR BLOOD PRESSURE SUPPORT): 80.0000 ug | PREFILLED_SYRINGE | INTRAVENOUS | Status: DC | PRN

## 2019-06-10 MED ORDER — DIPHENHYDRAMINE HCL 25 MG PO CAPS: 25.0000 mg | ORAL_CAPSULE | Freq: Four times a day (QID) | ORAL | Status: DC | PRN

## 2019-06-10 MED ORDER — ZOLPIDEM TARTRATE 5 MG PO TABS: 5.0000 mg | ORAL_TABLET | Freq: Every evening | ORAL | Status: DC | PRN

## 2019-06-10 MED ORDER — FENTANYL 2.5 MCG/ML W/ROPIVACAINE 0.15% IN NS 100 ML EPIDURAL (ARMC)
EPIDURAL | Status: AC
  Filled 2019-06-10: qty 100

## 2019-06-10 MED ORDER — LACTATED RINGERS IV SOLN
500.0000 mL | Freq: Once | INTRAVENOUS | Status: AC
  Administered 2019-06-10: 500 mL via INTRAVENOUS

## 2019-06-10 MED ORDER — OXYTOCIN 40 UNITS IN NORMAL SALINE INFUSION - SIMPLE MED: 1.0000 m[IU]/min | INTRAVENOUS | Status: DC

## 2019-06-10 MED ORDER — AMMONIA AROMATIC IN INHA
RESPIRATORY_TRACT | Status: AC
  Filled 2019-06-10: qty 10

## 2019-06-10 MED ORDER — BUTORPHANOL TARTRATE 1 MG/ML IJ SOLN
1.0000 mg | INTRAMUSCULAR | Status: DC | PRN
  Administered 2019-06-10: 1 mg via INTRAVENOUS
  Filled 2019-06-10: qty 1

## 2019-06-10 MED ORDER — SERTRALINE HCL 50 MG PO TABS
50.0000 mg | ORAL_TABLET | Freq: Every day | ORAL | Status: DC
  Administered 2019-06-10: 50 mg via ORAL
  Filled 2019-06-10: qty 1

## 2019-06-10 MED ORDER — LIDOCAINE-EPINEPHRINE (PF) 1.5 %-1:200000 IJ SOLN
INTRAMUSCULAR | Status: DC | PRN
  Administered 2019-06-10: 3 mL via PERINEURAL

## 2019-06-10 MED ORDER — BUPIVACAINE HCL (PF) 0.25 % IJ SOLN
INTRAMUSCULAR | Status: DC | PRN
  Administered 2019-06-10: 4 mL via EPIDURAL
  Administered 2019-06-10: 2 mL via EPIDURAL

## 2019-06-10 MED ORDER — IBUPROFEN 600 MG PO TABS
600.0000 mg | ORAL_TABLET | Freq: Four times a day (QID) | ORAL | Status: DC
  Administered 2019-06-11 – 2019-06-12 (×2): 600 mg via ORAL
  Filled 2019-06-10 (×2): qty 1

## 2019-06-10 MED ORDER — SODIUM CHLORIDE 0.9% FLUSH: 3.0000 mL | INTRAVENOUS | Status: DC | PRN

## 2019-06-10 MED ORDER — WITCH HAZEL-GLYCERIN EX PADS: 1.0000 "application " | MEDICATED_PAD | CUTANEOUS | Status: DC | PRN

## 2019-06-10 MED ORDER — TERBUTALINE SULFATE 1 MG/ML IJ SOLN: 0.2500 mg | Freq: Once | INTRAMUSCULAR | Status: DC | PRN

## 2019-06-10 MED ORDER — LIDOCAINE HCL (PF) 1 % IJ SOLN
30.0000 mL | INTRAMUSCULAR | Status: AC | PRN
  Administered 2019-06-10 (×2): 2 mL via SUBCUTANEOUS

## 2019-06-10 MED ORDER — COCONUT OIL OIL: 1.0000 "application " | TOPICAL_OIL | Status: DC | PRN

## 2019-06-10 MED ORDER — DIBUCAINE (PERIANAL) 1 % EX OINT: 1.0000 "application " | TOPICAL_OINTMENT | CUTANEOUS | Status: DC | PRN

## 2019-06-10 MED ORDER — OXYTOCIN 10 UNIT/ML IJ SOLN
INTRAMUSCULAR | Status: AC
  Filled 2019-06-10: qty 2

## 2019-06-10 MED ORDER — ACETAMINOPHEN 325 MG PO TABS: 650.0000 mg | ORAL_TABLET | ORAL | Status: DC | PRN

## 2019-06-10 MED ORDER — ONDANSETRON HCL 4 MG/2ML IJ SOLN: 4.0000 mg | INTRAMUSCULAR | Status: DC | PRN

## 2019-06-10 NOTE — H&P (Signed)
OB History & Physical   History of Present Illness:  Chief Complaint: spontaneous rupture of membranes at 36 weeks 4 days gestation; regular contractions  Date of initial H&P: 06/03/2019  HPI:  Jeanette Miranda is a 29 y.o. G2P1001 female at [redacted]w[redacted]d dated by LMP.  Her pregnancy has been complicated by chronic placental abruption in third trimester, threatened preterm labor third trimester.    She reports regular contractions.   She reports feeling a small gush of fluid at home early this morning at approximately 0330 with clear fluid seen.   She denies vaginal bleeding.   She reports fetal movement.    Total weight gain for pregnancy: 3.629 kg   Obstetrical Problem List: Pregnancy #2 Problems (from 09/27/18 to present)    Problem Noted Resolved   Threatened preterm labor, third trimester 05/17/2019 by Rod Can, CNM No   Supervision of low-risk pregnancy 11/13/2018 by Dalia Heading, CNM No   Overview Addendum 05/04/2019  4:41 PM by Homero Fellers, [place] Prenatal Labs  Dating By LMP c/w 7w u/s Blood type: A/Positive/-- (10/09 1458)   Genetic Screen NIPS: Normal Female. MSAFP: negative Antibody:Negative (10/09 1458)  Anatomic [place] 19 wks WNL Rubella: 3.25 (10/09 1458) Varicella: Immune  GTT 28 wk: 146 3hr- all nml values RPR: Non Reactive (10/09 1458)   Rhogam NA HBsAg: Negative (10/09 1458)   Vaccines TDAP: 04/20/2019                  Flu Shot: 11/06/2018 HIV: Non Reactive (10/09 1458)   Baby Food Breast- nursed son for 10 months                             GBS: negative 4/18 GC/CT negative/negative  Contraception IUD Pap:10/2018 negative  CBB  No   CS/VBAC n/a   Support Person Husband Daniel              Maternal Medical History:   Past Medical History:  Diagnosis Date  . Anxiety   . Asthma   . Depression     Past Surgical History:  Procedure Laterality Date  . BREAST BIOPSY    . TONSILLECTOMY    . WISDOM TOOTH EXTRACTION Bilateral      Allergies  Allergen Reactions  . Reglan [Metoclopramide] Other (See Comments)    Hot flash, unpleasant feeling     Prior to Admission medications   Medication Sig Start Date End Date Taking? Authorizing Provider  albuterol (ACCUNEB) 1.25 MG/3ML nebulizer solution Take 1 ampule by nebulization every 6 (six) hours as needed for wheezing or shortness of breath.    [provider]  famotidine (PEPCID) 20 MG tablet Take 1 tablet (20 mg total) by mouth 2 (two) times daily. 03/09/19   Dalia Heading, CNM  nitrofurantoin, macrocrystal-monohydrate, (MACROBID) 100 MG capsule Take 1 capsule (100 mg total) by mouth every 12 (twelve) hours. 05/23/19   Gae Dry, MD  Prenat-FeCbn-FeAsp-Meth-FA-DHA (PRENATE MINI) 18-0.6-0.4-350 MG CAPS Take 1 capsule by mouth daily. 03/12/19   Gae Dry, MD  sertraline (ZOLOFT) 50 MG tablet Take 1 tablet (50 mg total) by mouth daily. 03/09/19   Dalia Heading, CNM    OB History  Gravida Para Term Preterm AB Living  2 1 1     1   SAB TAB Ectopic Multiple Live Births        0 1    # Outcome Date GA  Lbr Len/2nd Weight Sex Delivery Anes PTL Lv  2 Current           1 Term 07/10/14 [redacted]w[redacted]d 15:40 / 00:53 3140 g M Vag-Spont EPI  LIV    Obstetric Comments  1st Menstrual Cycle:  13  1st Pregnancy:  23    Prenatal care site: [facility]  Social History: She  reports that she has never smoked. She has never used smokeless tobacco. She reports that she does not drink alcohol or use drugs.  Family History: family history includes Crohn's disease in her mother.    Review of Systems:  Review of Systems  Constitutional: Negative for chills and fever.  HENT: Negative for congestion, ear discharge, ear pain, hearing loss, sinus pain and sore throat.   Eyes: Negative for blurred vision and double vision.  Respiratory: Negative for cough, shortness of breath and wheezing.   Cardiovascular: Negative for chest pain, palpitations and leg  swelling.  Gastrointestinal: Negative for abdominal pain, blood in stool, constipation, diarrhea, heartburn, melena, nausea and vomiting.  Genitourinary: Negative for dysuria, flank pain, frequency, hematuria and urgency.  Musculoskeletal: Negative for back pain, joint pain and myalgias.  Skin: Negative for itching and rash.  Neurological: Negative for dizziness, tingling, tremors, sensory change, speech change, focal weakness, seizures, loss of consciousness, weakness and headaches.  Endo/Heme/Allergies: Negative for environmental allergies. Does not bruise/bleed easily.  Psychiatric/Behavioral: Negative for depression, hallucinations, memory loss, substance abuse and suicidal ideas. The patient is not nervous/anxious and does not have insomnia.      Physical Exam:  LMP 09/27/2018 (Exact Date)   Vital Signs: LMP 09/27/2018 (Exact Date)  Constitutional: Well nourished, well developed female in no acute distress.  HEENT: normal Skin: Warm and dry.  Cardiovascular: Regular rate and rhythm.   Extremity: no edema  Respiratory: Clear to auscultation bilateral. Normal respiratory effort Abdomen: FHT present, gravid Back: no CVAT Neuro: DTRs 2+, Cranial nerves grossly intact Psych: Alert and Oriented x3. No memory deficits. Normal mood and affect.  MS: normal gait, normal bilateral lower extremity ROM/strength/stability.  Pelvic exam:  VE: 3cms/70%/ -2. Cervix is posterior. Fetal head well applied.   Baseline FHR: 140 beats/min   Variability: moderate   Accelerations:absent   Decelerations: absent  Covid test was to be done 06/11/19 (previously set up for IOL on 5/16.  Lab Results  Component Value Date   [place] NEGATIVE 05/16/2019  ]  Assessment:  Jeanette Miranda is a 29 y.o. G66P1001 female at [redacted]w[redacted]d with spontaneous ROM, clear fluid Early labor chronic abruption this pregnancy Category   Plan:  1. Admit to Labor & Delivery  2. CBC, T&S, Clrs, IVF 3. GBS negative.    4. Fetal well-being: reassuring 5.  Epidural planned  6.     Monitor FHTs closely   Imagene Riches, CNM  06/10/2019 5:43 AM

## 2019-06-10 NOTE — Discharge Instructions (Signed)

## 2019-06-10 NOTE — Anesthesia Procedure Notes (Addendum)
Epidural Patient location during procedure: OB Start time: 06/10/2019 10:09 AM End time: 06/10/2019 10:15 AM  Staffing Anesthesiologist: Arita Miss, MD Resident/CRNA: Norm Salt, RN Other anesthesia staff: Aline Brochure, CRNA Performed: resident/CRNA, anesthesiologist and other anesthesia staff   Preanesthetic Checklist Completed: patient identified, IV checked, site marked, risks and benefits discussed, surgical consent, monitors and equipment checked, pre-op evaluation and timeout performed  Epidural Patient position: sitting Prep: ChloraPrep Patient monitoring: heart rate, continuous pulse ox and blood pressure Approach: midline Location: L3-L4 Injection technique: LOR saline  Needle:  Needle type: Tuohy  Needle gauge: 17 G Needle length: 9 cm and 9 Needle insertion depth: 5.5 cm Catheter type: closed end flexible Catheter size: 19 Gauge Catheter at skin depth: 10 cm Test dose: negative and 1.5% lidocaine with Epi 1:200 K  Assessment Sensory level: T10 Events: blood not aspirated, injection not painful, no injection resistance, no paresthesia and negative IV test  Additional Notes 1 attempt Pt. Evaluated and documentation done after procedure finished. Patient identified. Risks/Benefits/Options discussed with patient including but not limited to bleeding, infection, nerve damage, paralysis, failed block, incomplete pain control, headache, blood pressure changes, nausea, vomiting, reactions to medication both or allergic, itching and postpartum back pain. Confirmed with bedside nurse the patient's most recent platelet count. Confirmed with patient that they are not currently taking any anticoagulation, have any bleeding history or any family history of bleeding disorders. Patient expressed understanding and wished to proceed. All questions were answered. Sterile technique was used throughout the entire procedure. Please see nursing notes for vital signs. Test dose was  given through epidural catheter and negative prior to continuing to dose epidural or start infusion. Warning signs of high block given to the patient including shortness of breath, tingling/numbness in hands, complete motor block, or any concerning symptoms with instructions to call for help. Patient was given instructions on fall risk and not to get out of bed. All questions and concerns addressed with instructions to call with any issues or inadequate analgesia.   Patient tolerated the insertion well without immediate complications.Reason for block:procedure for pain

## 2019-06-10 NOTE — ED Notes (Signed)
Obs 4

## 2019-06-10 NOTE — Anesthesia Preprocedure Evaluation (Signed)
Anesthesia Evaluation   Patient awake    Reviewed: Allergy & Precautions, H&P , NPO status , Patient's Chart, lab work & pertinent test results  Airway Mallampati: II   Neck ROM: full    Dental no notable dental hx.    Pulmonary asthma ,           Cardiovascular Normal cardiovascular exam     Neuro/Psych Anxiety Depression negative neurological ROS     GI/Hepatic negative GI ROS, Neg liver ROS,   Endo/Other  negative endocrine ROS  Renal/GU negative Renal ROS  negative genitourinary   Musculoskeletal   Abdominal   Peds  Hematology negative hematology ROS (+)   Anesthesia Other Findings   Reproductive/Obstetrics (+) Pregnancy                             Anesthesia Physical Anesthesia Plan  ASA: II  Anesthesia Plan: Epidural   Post-op Pain Management:    Induction:   PONV Risk Score and Plan:   Airway Management Planned:   Additional Equipment:   Intra-op Plan:   Post-operative Plan:   Informed Consent: I have reviewed the patients History and Physical, chart, labs and discussed the procedure including the risks, benefits and alternatives for the proposed anesthesia with the patient or authorized representative who has indicated his/her understanding and acceptance.     Dental Advisory Given  Plan Discussed with: Anesthesiologist and CRNA  Anesthesia Plan Comments:         Anesthesia Quick Evaluation

## 2019-06-10 NOTE — Progress Notes (Signed)
  Labor Progress Note   29 y.o. G2P1001 @ [redacted]w[redacted]d , admitted for  Pregnancy, Labor Management.   Subjective:  Stadol has worn off and patient is requesting epidural  Objective:  BP 105/74 (BP Location: Right Arm)   Pulse 88   Temp 97.9 F (36.6 C) (Oral)   Resp 17   Ht 5\' 1"  (1.549 m)   Wt 53.5 kg   LMP 09/27/2018 (Exact Date)   BMI 22.30 kg/m  Abd: gravid, ND, FHT present, mild tenderness on exam Extr: none SVE: CERVIX: 4 cm dilated, 80 effaced, -1 station SROM at 3:30 this morning Cervix posterior and pulled to midline, sweep done, blood tinged clear fluid  EFM: FHR: 140 bpm, variability: moderate,  accelerations:  Present,  decelerations:  Absent Toco: Frequency: Every 2-3 minutes Labs: I have reviewed the patient's lab results.   Assessment & Plan:  G2P1001 @ [redacted]w[redacted]d, admitted for  Pregnancy and Labor/Delivery Management  1. Pain management: s/p dose of stadol, anesthesia notified of patient desire for epidural. 2. FWB: FHT category I.  3. ID: GBS negative, consider antibiotics if rupture is prolonged 4. Labor management: augment with pitocin if needed  All discussed with patient, see orders   Rod Can, [facility] 06/10/2019  9:40 AM

## 2019-06-10 NOTE — Discharge Summary (Signed)
Postpartum Discharge Summary     Patient Name: Jeanette Miranda DOB: March 31, 1990 MRN: [id number]  Date of admission: 06/10/2019 Delivery date:06/10/2019  Delivering provider: Gae Dry  Date of discharge: 06/12/2019  Admitting diagnosis: Indication for care in labor or delivery [O75.9] Intrauterine pregnancy: [redacted]w[redacted]d    Secondary diagnosis:  Principal Problem:   Preterm delivery, delivered Active Problems:   Threatened preterm labor, third trimester   Indication for care in labor or delivery  Additional problems: None    Discharge diagnosis: Preterm Pregnancy Delivered                                              Post partum procedures:none Augmentation: N/A Complications: None  Hospital course: Onset of Labor With Vaginal Delivery      29y.o. yo [id number] admitted in Active Labor on 06/10/2019. Patient had an uncomplicated labor course as follows:  Membrane Rupture Time/Date: 3:50 AM ,06/10/2019  prior to admission Delivery Method:Vaginal, Spontaneous  Episiotomy: None  Lacerations:  None  Patient had an uncomplicated postpartum course.  She is ambulating, tolerating a regular diet, passing flatus, and urinating well. Patient is discharged home in stable condition on 06/12/19.  Newborn Data: Birth date:06/10/2019  Birth time:2:53 PM  Gender:Female     M Living status:Living  Apgars:9 ,9     9,9  Magnesium Sulfate received: No BMZ received: Yes Rhophylac:N/A MMR:No T-DaP:Given prenatally Flu: Yes Transfusion:No  Physical exam  Vitals:   06/11/19 0730 06/11/19 1607 06/12/19 0059 06/12/19 0732  BP: 97/70 104/62 100/62 109/66  Pulse: 65 60 61 60  Resp: '20 18 18 18  ' Temp: 98 F (36.7 C) 97.8 F (36.6 C) 97.9 F (36.6 C) 98.3 F (36.8 C)  TempSrc: Oral Oral Oral Oral  SpO2: 100%  99% 100%  Weight:      Height:       General: alert, cooperative and no distress Lochia: appropriate Uterine Fundus: firm  DVT Evaluation: No evidence of DVT seen  on physical exam. Labs: Lab Results  Component Value Date   WBC 12.4 (H) 06/11/2019   HGB 10.0 (L) 06/11/2019   HCT 28.6 (L) 06/11/2019   MCV 86.7 06/11/2019   PLT 166 06/11/2019   CMP Latest Ref Rng & Units 11/13/2018  Glucose 70 - 99 mg/dL 76  BUN 6 - 20 mg/dL 11  Creatinine 0.44 - 1.00 mg/dL 0.40(L)  Sodium 135 - 145 mmol/L 135  Potassium 3.5 - 5.1 mmol/L 3.7  Chloride 98 - 111 mmol/L 102  CO2 22 - 32 mmol/L 24  Calcium 8.9 - 10.3 mg/dL 9.6  Total Protein 6.5 - 8.1 g/dL 6.9  Total Bilirubin 0.3 - 1.2 mg/dL 1.0  Alkaline Phos 38 - 126 U/L 36(L)  AST 15 - 41 U/L 16  ALT 0 - 44 U/L 15   Edinburgh Score: Edinburgh Postnatal Depression Scale Screening Tool 06/10/2019  I have been able to laugh and see the funny side of things. 0  I have looked forward with enjoyment to things. 0  I have blamed myself unnecessarily when things went wrong. 1  I have been anxious or worried for no good reason. 2  I have felt scared or panicky for no good reason. 1  Things have been getting on top of me. 1  I have been so unhappy that I  have had difficulty sleeping. 1  I have felt sad or miserable. 1  I have been so unhappy that I have been crying. 1  The thought of harming myself has occurred to me. 0  Edinburgh Postnatal Depression Scale Total 8      After visit meds:  Allergies as of 06/12/2019      Reactions   Reglan [metoclopramide] Other (See Comments)   Hot flash, unpleasant feeling      Medication List    STOP taking these medications   nitrofurantoin (macrocrystal-monohydrate) 100 MG capsule Commonly known as: MACROBID     TAKE these medications   albuterol 1.25 MG/3ML nebulizer solution Commonly known as: ACCUNEB Take 1 ampule by nebulization every 6 (six) hours as needed for wheezing or shortness of breath.   famotidine 20 MG tablet Commonly known as: PEPCID Take 1 tablet (20 mg total) by mouth 2 (two) times daily.   ibuprofen 600 MG tablet Commonly known as:  ADVIL Take 1 tablet (600 mg total) by mouth every 6 (six) hours as needed for cramping.   Prenate Mini 18-0.6-0.4-350 MG Caps Take 1 capsule by mouth daily.   sertraline 50 MG tablet Commonly known as: ZOLOFT Take 1 tablet (50 mg total) by mouth daily.        Discharge home in stable condition Infant Feeding: Breast Infant Disposition:home with mother Discharge instruction: per After Visit Summary and Postpartum booklet. Activity: Advance as tolerated. Pelvic rest for 6 weeks.  Diet: routine diet Anticipated Birth Control: IUD Postpartum Appointment:6 weeks Additional Postpartum F/U: Postpartum Depression checkup Future Appointments:No future appointments. Follow up Visit: Follow-up Information    Gae Dry, MD. Schedule an appointment as soon as possible for a visit in 1 week(s).   Specialty: Obstetrics and Gynecology Why: 1 week BP check, 6 week postpartum visit Contact information: [place] [id number] [phone]               06/12/2019 Malachy Mood, MD

## 2019-06-10 NOTE — OB Triage Note (Signed)
Recvd pt from ED. Pt states she woke up and felt like she wet the bed. Pt states the fluid was odorless and clear that has started to look a little more pink tinged. Feeling baby move well. Pt also states she is having contractions about 4 min apart.

## 2019-06-11 ENCOUNTER — Encounter: Payer: Self-pay | Admitting: Obstetrics and Gynecology

## 2019-06-11 LAB — RPR: RPR Ser Ql: NONREACTIVE

## 2019-06-11 LAB — CBC
HCT: 28.6 % — ABNORMAL LOW (ref 36.0–46.0)
Hemoglobin: 10 g/dL — ABNORMAL LOW (ref 12.0–15.0)
MCH: 30.3 pg (ref 26.0–34.0)
MCHC: 35 g/dL (ref 30.0–36.0)
MCV: 86.7 fL (ref 80.0–100.0)
Platelets: 166 10*3/uL (ref 150–400)
RBC: 3.3 MIL/uL — ABNORMAL LOW (ref 3.87–5.11)
RDW: 12.7 % (ref 11.5–15.5)
WBC: 12.4 10*3/uL — ABNORMAL HIGH (ref 4.0–10.5)
nRBC: 0 % (ref 0.0–0.2)

## 2019-06-11 MED ORDER — SERTRALINE HCL 25 MG PO TABS
50.0000 mg | ORAL_TABLET | Freq: Every day | ORAL | Status: DC
  Administered 2019-06-11: 50 mg via ORAL
  Filled 2019-06-11: qty 2

## 2019-06-11 MED ORDER — FERROUS SULFATE 325 (65 FE) MG PO TABS
325.0000 mg | ORAL_TABLET | Freq: Every day | ORAL | Status: DC
  Administered 2019-06-11: 325 mg via ORAL
  Filled 2019-06-11: qty 1

## 2019-06-11 NOTE — Anesthesia Postprocedure Evaluation (Signed)
Anesthesia Post Note  Patient: Jeanette Miranda  Procedure(s) Performed: AN AD HOC LABOR EPIDURAL  Patient location during evaluation: Mother Baby Anesthesia Type: Epidural Level of consciousness: awake, oriented and awake and alert Pain management: pain level controlled Vital Signs Assessment: post-procedure vital signs reviewed and stable Respiratory status: spontaneous breathing, nonlabored ventilation and respiratory function stable Cardiovascular status: blood pressure returned to baseline and stable Postop Assessment: no headache, able to ambulate and patient able to bend at knees Anesthetic complications: no     Last Vitals:  Vitals:   06/11/19 0427 06/11/19 0730  BP: 109/67 97/70  Pulse: 70 65  Resp: 18 20  Temp: 36.6 C 36.7 C  SpO2: 100% 100%    Last Pain:  Vitals:   06/11/19 0745  TempSrc:   PainSc: 0-No pain                 Johnna Acosta

## 2019-06-11 NOTE — Lactation Note (Signed)
This note was copied from a baby's chart. Lactation Consultation Note  Patient Name: Jeanette Miranda [id number] Date: 06/11/2019 Reason for consult: Initial assessment;Late-preterm 34-36.6wks   Maternal Data Formula Feeding for Exclusion: No Does the patient have breastfeeding experience prior to this delivery?: Yes Has a 28 yr old son who breastfed x 10 mths, reports no problems with feeding Feeding Feeding Type: Breast Fed NO feeding observed, baby swaddled and asleep, dad holding LATCH Score Latch: (no feeding observed)                 Interventions    Lactation Tools Discussed/Used WIC Program: Yes   Consult Status Consult Status: PRN    Ferol Luz 06/11/2019, 11:51 AM

## 2019-06-11 NOTE — Lactation Note (Signed)
This note was copied from a baby's chart. Lactation Consultation Note  Patient Name: Jeanette Miranda [id number] Date: 06/11/2019 Reason for consult: Follow-up assessment;Late-preterm 34-36.6wks   Maternal Data Formula Feeding for Exclusion: No Does the patient have breastfeeding experience prior to this delivery?: Yes  Feeding Feeding Type: Breast Fed Baby sleepy, would latch suck a few times and fall asleep, had circ this am LATCH Score Latch: Too sleepy or reluctant, no latch achieved, no sucking elicited.  Audible Swallowing: None  Type of Nipple: Everted at rest and after stimulation  Comfort (Breast/Nipple): Soft / non-tender  Hold (Positioning): No assistance needed to correctly position infant at breast.  LATCH Score: 6  Interventions Interventions: Skin to skin;Hand express Attempt again in 30 min or if baby demonstrates feeding cues Lactation Tools Discussed/Used WIC Program: Yes   Consult Status Consult Status: PRN    Ferol Luz 06/11/2019, 1:35 PM

## 2019-06-11 NOTE — Progress Notes (Signed)
Post Partum Day 1 Subjective: no complaints, up ad lib, voiding and tolerating PO. Mild cramping. Breast feeding Baby doing well. Pediatricians want to observe baby for another 24 hours due to prematurity  Objective: Blood pressure 97/70, pulse 65, temperature 98 F (36.7 C), temperature source Oral, resp. rate 20, height 5\' 1"  (1.549 m), weight 53.5 kg, last menstrual period 09/27/2018, SpO2 100 %, unknown if currently breastfeeding.  Physical Exam:  General: alert, cooperative and no distress Lochia: appropriate Uterine Fundus: firm/ U-1/mildly tender/ ML DVT Evaluation: No evidence of DVT seen on physical exam. No significant calf/ankle edema.  Recent Labs    06/10/19 0516 06/11/19 0618  HGB 11.7* 10.0*  HCT 33.7* 28.6*  WBC 8.2 12.4*  PLT 152 166    Assessment/Plan: PPD #1-stable Mild anemia-hemodynamically stable and asymptomatic  Po vitamins and iron A POS/ RI/ VI TDAP UTD Breast IUD  Probable discharge tomorrow  Dalia Heading, CNM   LOS: 1 day   Dalia Heading 06/11/2019, 9:53 AM

## 2019-06-11 NOTE — Lactation Note (Signed)
This note was copied from a baby's chart. Lactation Consultation Note  Patient Name: Jeanette Miranda [id number] Date: 06/11/2019 Reason for consult: Follow-up assessment;Late-preterm 34-36.6wks Fed well at earlier feed around 1400 for 26 min. Attempted again at 1718, latched to left breast and nursed approx 2 min and fell asleep   Maternal Data    Feeding Feeding Type: Breast Fed  LATCH Score                   Interventions    Lactation Tools Discussed/Used  Mom recommended to awaken and attempt again in 30-60 min.   Consult Status Consult Status: Follow-up Date: 06/12/19 Follow-up type: In-patient    Ferol Luz 06/11/2019, 5:31 PM

## 2019-06-12 MED ORDER — IBUPROFEN 600 MG PO TABS: 600.0000 mg | ORAL_TABLET | Freq: Four times a day (QID) | ORAL | 0 refills | Status: DC | PRN

## 2019-06-12 NOTE — Lactation Note (Signed)
This note was copied from a baby's chart. Lactation Consultation Note  Patient Name: Jeanette Miranda [id number] Date: 06/12/2019  Mom holding sleeping baby. Taught techniques to wake baby and encouraged to feed 8-12 feedings/24 hours. Mom reports hearing swallowing and feeling some increasing breast fullness. Breast compression and massage while baby at breast and occasional hand expression post feeds encouraged and to spoon feed any collected. LC support services post discharge reviewed. Mom to notify [facility] for next feed for verification of latch and milk transfer. Partner present and supportive.   Maternal Data    Feeding Feeding Type: Breast Fed  LATCH Score                   Interventions    Lactation Tools Discussed/Used Pump Review: Setup, frequency, and cleaning   Consult Status      Jeanette Miranda 06/12/2019, 10:45 AM

## 2019-06-12 NOTE — Progress Notes (Signed)
Pt discharged with infant. Discharge instructions, prescriptions, and follow up appointments given to and reviewed with patient. Pt verbalized understanding. Escorted out by staff. 

## 2019-06-16 ENCOUNTER — Encounter: Payer: Self-pay | Admitting: Obstetrics & Gynecology

## 2019-06-16 ENCOUNTER — Telehealth: Payer: Self-pay | Admitting: Obstetrics & Gynecology

## 2019-06-16 ENCOUNTER — Encounter: Payer: Medicaid Other | Admitting: Obstetrics & Gynecology

## 2019-06-16 ENCOUNTER — Other Ambulatory Visit: Payer: Self-pay

## 2019-06-16 ENCOUNTER — Ambulatory Visit (INDEPENDENT_AMBULATORY_CARE_PROVIDER_SITE_OTHER): Payer: Medicaid Other | Admitting: Obstetrics & Gynecology

## 2019-06-16 NOTE — Telephone Encounter (Signed)
6/28 AT 210 FOR MIRENA WITH [facility]

## 2019-06-16 NOTE — Progress Notes (Signed)
  History of Present Illness:  Jeanette Miranda is a 29 y.o. who was delivered  approximately 1 weeks ago. Since that time, she states that her symptoms of depression and anxiety are waning, with minimal concerns at this time. She had risk factors based on prematurity and complications leading up to delivery w abruption.  Her Edinburgh scale on discharge from hospital was 8.  Also, breast feeding well and no BP concerns.  Denies SI, HI, and neglect; cares for infant well and has support from husband.  PMHx: She  has a past medical history of Anxiety, Asthma, and Depression. Also,  has a past surgical history that includes Tonsillectomy; Wisdom tooth extraction (Bilateral); and Breast biopsy., family history includes Crohn's disease in her mother.,  reports that she has never smoked. She has never used smokeless tobacco. She reports that she does not drink alcohol or use drugs. Current Meds  Medication Sig  . Prenat-FeCbn-FeAsp-Meth-FA-DHA (PRENATE MINI) 18-0.6-0.4-350 MG CAPS Take 1 capsule by mouth daily.  . sertraline (ZOLOFT) 50 MG tablet Take 1 tablet (50 mg total) by mouth daily.  . Also, is allergic to reglan [metoclopramide]..  Review of Systems  All other systems reviewed and are negative.   Physical Exam:  BP 100/60   Ht 5\' 1"  (1.549 m)   Wt 108 lb (49 kg)   BMI 20.41 kg/m  Body mass index is 20.41 kg/m. Constitutional: Well nourished, well developed female in no acute distress.  Abdomen: diffusely non tender to palpation, non distended, and no masses, hernias Neuro: Grossly intact Psych:  Normal mood and affect.    Assessment:  Problem List Items Addressed This Visit   Visit Diagnoses    Postpartum care and examination immediately after delivery \ Risk for postpartum depression     Plan: She will undergo no change in her medical therapy. Monitor for any worsening s/sx PPD Plans IUD for contraception, plan next visit  A total of 15 minutes were spent face-to-face with  the patient as well as preparation, review, communication, and documentation during this encounter.   Barnett Applebaum, MD, [facility], [facility] 06/16/2019  10:38 AM

## 2019-06-17 NOTE — Telephone Encounter (Signed)
Noted. Will order to arrive by apt date/time. 

## 2019-07-04 ENCOUNTER — Inpatient Hospital Stay: Admit: 2019-07-04 | Payer: Self-pay

## 2019-07-22 NOTE — Telephone Encounter (Signed)
Mirena reserved for this patient.

## 2019-07-26 ENCOUNTER — Ambulatory Visit: Payer: Medicaid Other | Admitting: Obstetrics & Gynecology

## 2019-07-26 ENCOUNTER — Other Ambulatory Visit: Payer: Self-pay | Admitting: Obstetrics & Gynecology

## 2019-07-26 MED ORDER — FLUCONAZOLE 150 MG PO TABS: 150.0000 mg | ORAL_TABLET | Freq: Once | ORAL | 3 refills | Status: AC

## 2019-08-06 ENCOUNTER — Encounter: Payer: Self-pay | Admitting: Obstetrics & Gynecology

## 2019-08-06 ENCOUNTER — Other Ambulatory Visit: Payer: Self-pay

## 2019-08-06 ENCOUNTER — Ambulatory Visit (INDEPENDENT_AMBULATORY_CARE_PROVIDER_SITE_OTHER): Payer: Medicaid Other | Admitting: Obstetrics & Gynecology

## 2019-08-06 DIAGNOSIS — Z30430 Encounter for insertion of intrauterine contraceptive device: Secondary | ICD-10-CM

## 2019-08-06 NOTE — Patient Instructions (Signed)
Intrauterine Device Insertion, Care After  This sheet gives you information about how to care for yourself after your procedure. Your health care provider may also give you more specific instructions. If you have problems or questions, contact your health care provider. What can I expect after the procedure? After the procedure, it is common to have:  Cramps and pain in the abdomen.  Light bleeding (spotting) or heavier bleeding that is like your menstrual period. This may last for up to a few days.  Lower back pain.  Dizziness.  Headaches.  Nausea. Follow these instructions at home:  Before resuming sexual activity, check to make sure that you can feel the IUD string(s). You should be able to feel the end of the string(s) below the opening of your cervix. If your IUD string is in place, you may resume sexual activity. ? If you had a hormonal IUD inserted more than 7 days after your most recent period started, you will need to use a backup method of birth control for 7 days after IUD insertion. Ask your health care provider whether this applies to you.  Continue to check that the IUD is still in place by feeling for the string(s) after every menstrual period, or once a month.  Take over-the-counter and prescription medicines only as told by your health care provider.  Do not drive or use heavy machinery while taking prescription pain medicine.  Keep all follow-up visits as told by your health care provider. This is important. Contact a health care provider if:  You have bleeding that is heavier or lasts longer than a normal menstrual cycle.  You have a fever.  You have cramps or abdominal pain that get worse or do not get better with medicine.  You develop abdominal pain that is new or is not in the same area of earlier cramping and pain.  You feel lightheaded or weak.  You have abnormal or bad-smelling discharge from your vagina.  You have pain during sexual  activity.  You have any of the following problems with your IUD string(s): ? The string bothers or hurts you or your sexual partner. ? You cannot feel the string. ? The string has gotten longer.  You can feel the IUD in your vagina.  You think you may be pregnant, or you miss your menstrual period.  You think you may have an STI (sexually transmitted infection). Get help right away if:  You have flu-like symptoms.  You have a fever and chills.  You can feel that your IUD has slipped out of place. Summary  After the procedure, it is common to have cramps and pain in the abdomen. It is also common to have light bleeding (spotting) or heavier bleeding that is like your menstrual period.  Continue to check that the IUD is still in place by feeling for the string(s) after every menstrual period, or once a month.  Keep all follow-up visits as told by your health care provider. This is important.  Contact your health care provider if you have problems with your IUD string(s), such as the string getting longer or bothering you or your sexual partner. This information is not intended to replace advice given to you by your health care provider. Make sure you discuss any questions you have with your health care provider. Document Revised: 12/27/2016 Document Reviewed: 12/06/2015 Elsevier Patient Education  2020 [employer]  

## 2019-08-06 NOTE — Progress Notes (Signed)
  OBSTETRICS POSTPARTUM CLINIC PROGRESS NOTE  Subjective:     Jeanette Miranda is a 29 y.o. G47P1102 female who presents for a postpartum visit. She is 6 weeks postpartum following a Preterm pregnancy <37 weeks and delivery by Vaginal, no problems at delivery.  I have fully reviewed the prenatal and intrapartum course. Anesthesia: epidural.  Postpartum course has been complicated by uncomplicated.  Baby is feeding by Breast.  Bleeding: patient has not  resumed menses.  Bowel function is normal. Bladder function is normal.  Patient is not sexually active. Contraception method desired is IUD.  Postpartum depression screening: negative. Edinburgh 5.  The following portions of the patient's history were reviewed and updated as appropriate: allergies, current medications, past family history, past medical history, past social history, past surgical history and problem list.  Review of Systems Pertinent items are noted in HPI.  Objective:    BP 100/60   Ht 5\' 1"  (1.549 m)   Wt 106 lb (48.1 kg)   BMI 20.03 kg/m   General:  alert and no distress   Breasts:  inspection negative, no nipple discharge or bleeding, no masses or nodularity palpable  Lungs: clear to auscultation bilaterally  Heart:  regular rate and rhythm, S1, S2 normal, no murmur, click, rub or gallop  Abdomen: soft, non-tender; bowel sounds normal; no masses,  no organomegaly.     Vulva:  normal  Vagina: normal vagina, no discharge, exudate, lesion, or erythema  Cervix:  no cervical motion tenderness and no lesions  Corpus: normal size, contour, position, consistency, mobility, non-tender  Adnexa:  normal adnexa and no mass, fullness, tenderness  Rectal Exam: Not performed.          Assessment:  Post Partum Care visit 1. Postpartum care and examination immediately after delivery  2. Encounter for insertion of intrauterine contraceptive device (IUD)   Plan:  See orders and Patient Instructions Resume all normal  activities Follow up in: 1 month or as needed.    IUD PROCEDURE NOTE:  Jeanette Miranda is a 29 y.o. [phone] here for IUD insertion. No GYN concerns.  Last pap smear was normal.  IUD Insertion Procedure Note Patient identified, informed consent performed, consent signed.   Discussed risks of irregular bleeding, cramping, infection, malpositioning or misplacement of the IUD outside the uterus which may require further procedure such as laparoscopy, risk of failure <1%. Time out was performed.  Urine pregnancy test negative.  A bimanual exam showed the uterus to be midposition.  Speculum placed in the vagina.  Cervix visualized.  Cleaned with Betadine x 2.  Grasped anteriorly with a single tooth tenaculum.  Uterus sounded to 7 cm.   Mirena IUD placed per manufacturer's recommendations.  Strings trimmed to 3 cm. Tenaculum was removed, good hemostasis noted.  Patient tolerated procedure well.   Patient was given post-procedure instructions.  She was advised to have backup contraception for one week.  Patient was also asked to check IUD strings periodically and follow up in 4 weeks for IUD check.  Barnett Applebaum, MD, [facility], [facility] 08/06/2019  1:48 PM

## 2019-09-03 ENCOUNTER — Ambulatory Visit: Payer: Self-pay | Admitting: Obstetrics & Gynecology

## 2019-09-24 ENCOUNTER — Encounter: Payer: Self-pay | Admitting: Obstetrics & Gynecology

## 2019-09-24 ENCOUNTER — Ambulatory Visit (INDEPENDENT_AMBULATORY_CARE_PROVIDER_SITE_OTHER): Payer: Medicaid Other | Admitting: Obstetrics & Gynecology

## 2019-09-24 ENCOUNTER — Other Ambulatory Visit: Payer: Self-pay

## 2019-09-24 VITALS — BP 100/60 | Ht 61.0 in | Wt 110.0 lb

## 2019-09-24 DIAGNOSIS — Z30431 Encounter for routine checking of intrauterine contraceptive device: Secondary | ICD-10-CM

## 2019-09-24 NOTE — Progress Notes (Signed)
  History of Present Illness:  Jeanette Miranda is a 29 y.o. that had a Mirena IUD placed approximately 4 weeks ago. Since that time, she states that she has had no bleeding and pain  PMHx: She  has a past medical history of Anxiety, Asthma, and Depression. Also,  has a past surgical history that includes Tonsillectomy; Wisdom tooth extraction (Bilateral); and Breast biopsy., family history includes Crohn's disease in her mother.,  reports that she has never smoked. She has never used smokeless tobacco. She reports that she does not drink alcohol and does not use drugs. Current Meds  Medication Sig  . Prenat-FeCbn-FeAsp-Meth-FA-DHA (PRENATE MINI) 18-0.6-0.4-350 MG CAPS Take 1 capsule by mouth daily.  . sertraline (ZOLOFT) 50 MG tablet Take 1 tablet (50 mg total) by mouth daily.  .  Also, is allergic to reglan [metoclopramide]..  Review of Systems  All other systems reviewed and are negative.   Physical Exam:  BP 100/60   Ht 5\' 1"  (1.549 m)   Wt 110 lb (49.9 kg)   BMI 20.78 kg/m  Body mass index is 20.78 kg/m. Constitutional: Well nourished, well developed female in no acute distress.  Abdomen: diffusely non tender to palpation, non distended, and no masses, hernias Neuro: Grossly intact Psych:  Normal mood and affect.    Pelvic exam:  Two IUD strings present seen coming from the cervical os. EGBUS, vaginal vault and cervix: within normal limits  Assessment: IUD strings present in proper location; pt doing well  Plan: She was told to continue to use barrier contraception, in order to prevent any STIs, and to take a home pregnancy test or call us if she ever thinks she may be pregnant, and that her IUD expires in 6 years.  Tolerating Zoloft and feels improved and stable mood  She was amenable to this plan and we will see her back for annual/PAP when due.  A total of 20 minutes were spent face-to-face with the patient as well as preparation, review, communication, and  documentation during this encounter.   Barnett Applebaum, MD, [facility], [facility] 09/24/2019  2:23 PM

## 2019-10-11 ENCOUNTER — Other Ambulatory Visit: Payer: Self-pay

## 2019-10-13 ENCOUNTER — Other Ambulatory Visit: Payer: Self-pay

## 2019-10-13 ENCOUNTER — Ambulatory Visit (INDEPENDENT_AMBULATORY_CARE_PROVIDER_SITE_OTHER): Payer: Medicaid Other | Admitting: Nurse Practitioner

## 2019-10-13 ENCOUNTER — Encounter: Payer: Self-pay | Admitting: Nurse Practitioner

## 2019-10-13 ENCOUNTER — Ambulatory Visit (INDEPENDENT_AMBULATORY_CARE_PROVIDER_SITE_OTHER): Payer: Medicaid Other

## 2019-10-13 VITALS — BP 108/78 | HR 70 | Temp 98.4°F | Ht 61.0 in | Wt 110.0 lb

## 2019-10-13 DIAGNOSIS — M25562 Pain in left knee: Secondary | ICD-10-CM

## 2019-10-13 DIAGNOSIS — Z0000 Encounter for general adult medical examination without abnormal findings: Secondary | ICD-10-CM | POA: Insufficient documentation

## 2019-10-13 DIAGNOSIS — M222X9 Patellofemoral disorders, unspecified knee: Secondary | ICD-10-CM | POA: Insufficient documentation

## 2019-10-13 NOTE — Patient Instructions (Addendum)
Please go to the x-ray today.  I placed referral into orthopedics for your left knee injury.This may be a meniscus partial tear.   Since you still have swelling - keep using ice for 10 min x 3-4 per day and elevate and compression. Try using the left knee support sleeve. You will need to purchase at [facility] or your drug store.  Hopefully this will help stabilize the knee as you are walking.  Please take Tylenol as needed for pain.  Since you are nursing your baby, you will need to check with your OB regarding any other pain medication.   Acute Knee Pain, Adult Acute knee pain is sudden and may be caused by damage, swelling, or irritation of the muscles and tissues that support your knee. The injury may result from:  A fall.  An injury to your knee from twisting motions.  A hit to the knee.  Infection. Acute knee pain may go away on its own with time and rest. If it does not, your health care provider may order tests to find the cause of the pain. These may include:  Imaging tests, such as an X-ray, MRI, or ultrasound.  Joint aspiration. In this test, fluid is removed from the knee.  Arthroscopy. In this test, a lighted tube is inserted into the knee and an image is projected onto a TV screen.  Biopsy. In this test, a sample of tissue is removed from the body and studied under a microscope. Follow these instructions at home: Pay attention to any changes in your symptoms. Take these actions to relieve your pain. If you have a knee sleeve or brace:   Wear the sleeve or brace as told by your health care provider. Remove it only as told by your health care provider.  Loosen the sleeve or brace if your toes tingle, become numb, or turn cold and blue.  Keep the sleeve or brace clean.  If the sleeve or brace is not waterproof: ? Do not let it get wet. ? Cover it with a watertight covering when you take a bath or shower. Activity  Rest your knee.  Do not do things that cause  pain or make pain worse.  Avoid high-impact activities or exercises, such as running, jumping rope, or doing jumping jacks.  Work with a physical therapist to make a safe exercise program, as recommended by your health care provider. Do exercises as told by your physical therapist. Managing pain, stiffness, and swelling   If directed, put ice on the knee: ? Put ice in a plastic bag. ? Place a towel between your skin and the bag. ? Leave the ice on for 20 minutes, 2-3 times a day.  If directed, use an elastic bandage to put pressure (compression) on your injured knee. This may control swelling, give support, and help with discomfort. General instructions  Take over-the-counter and prescription medicines only as told by your health care provider.  Raise (elevate) your knee above the level of your heart when you are sitting or lying down.  Sleep with a pillow under your knee.  Do not use any products that contain nicotine or tobacco, such as cigarettes, e-cigarettes, and chewing tobacco. These can delay healing. If you need help quitting, ask your health care provider.  If you are overweight, work with your health care provider and a dietitian to set a weight-loss goal that is healthy and reasonable for you. Extra weight can put pressure on your knee.  Keep all follow-up  visits as told by your health care provider. This is important. Contact a health care provider if:  Your knee pain continues, changes, or gets worse.  You have a fever along with knee pain.  Your knee feels warm to the touch.  Your knee buckles or locks up. Get help right away if:  Your knee swells, and the swelling becomes worse.  You cannot move your knee.  You have severe pain in your knee. Summary  Acute knee pain can be caused by a fall, an injury, an infection, or damage, swelling, or irritation of the tissues that support your knee.  Your health care provider may perform tests to find out the cause  of the pain.  Pay attention to any changes in your symptoms. Relieve your pain with rest, medicines, light activity, and use of ice.  Get help if your pain continues or becomes worse, your knee swells, or you cannot move your knee. This information is not intended to replace advice given to you by your health care provider. Make sure you discuss any questions you have with your health care provider. Document Revised: 06/26/2017 Document Reviewed: 06/26/2017 Elsevier Patient Education  [place].

## 2019-10-13 NOTE — Progress Notes (Signed)
New Patient Office Visit  Subjective:  Patient ID: Jeanette Miranda, female    DOB: 1990/08/06  Age: 29 y.o. MRN: [id number]  CC:  Chief Complaint  Patient presents with  . New Patient (Initial Visit)    establish care    HPI Jeanette Miranda is a 29 year old with history of depression/anxiety, and asthma patient who presents to establish care with primary care provider.  She has concerns about knee pain.  Left knee pain: Patient was walking down stairs and slipped on the bottom step 4 weeks ago.  She twisted her body in order to catch herself, and she felt a pull to the left knee.  This caused her to fall to the floor.  She was able to get up and ambulate but she noticed a stiff knee and painfulness.  There was no initial bruising or edema.  She felt like the knee was not moving as fluid as it should.That night it hurt to lay on her left side and she put a pillow between her knee.  Over the next days, she did take Tylenol, but is lactating so she does not like to take medication.  She applied ice pack and an ace bandage for support.   Anxiety/depression: This was diagnosed 2013 and is stable on sertraline 50 mg daily.  Asthma: Controlled.  Her asthma was diagnosed in the eighth grade.  She reports is always been mild and controlled with as needed ProAir inhaler. She has not needed to use that in a long while.  Her asthma seems to flare during the winter when the air is dry or in the summer when it is a very hot summer.  She has not used Singulair, had pulmonary testing or PFT testing. Non smoker.  Allergic rhinitis: Reports seasonal allergies, takes as needed Benadryl.  No active problems.  He does have seasonal allergies and takes as needed Benadryl.   Past Medical History:  Diagnosis Date  . Allergy   . Anxiety   . Asthma   . Depression   . GERD (gastroesophageal reflux disease)   . UTI (urinary tract infection)     Past Surgical History:  Procedure Laterality Date  . BREAST  BIOPSY    . TONSILLECTOMY    . TONSILLECTOMY AND ADENOIDECTOMY    . WISDOM TOOTH EXTRACTION Bilateral     Family History  Problem Relation Age of Onset  . Crohn's disease Mother   . Asthma Mother   . Depression Mother   . Early death Mother   . Arthritis Father   . Depression Father   . Arthritis Paternal Grandmother   . Depression Paternal Grandmother   . Mental retardation Paternal Grandmother   . Arthritis Paternal Grandfather   . Asthma Brother     Social History   Socioeconomic History  . Marital status: Married    Spouse name: Not on file  . Number of children: Not on file  . Years of education: Not on file  . Highest education level: Some college, no degree  Occupational History  . Not on file  Tobacco Use  . Smoking status: Never Smoker  . Smokeless tobacco: Never Used  Vaping Use  . Vaping Use: Never used  Substance and Sexual Activity  . Alcohol use: No  . Drug use: No  . Sexual activity: Yes    Birth control/protection: None, I.U.D.  Other Topics Concern  . Not on file  Social History Narrative  . Not on file  Social Determinants of Health   Financial Resource Strain:   . Difficulty of Paying Living Expenses: Not on file  Food Insecurity:   . Worried About Charity fundraiser in the Last Year: Not on file  . Ran Out of Food in the Last Year: Not on file  Transportation Needs:   . Lack of Transportation (Medical): Not on file  . Lack of Transportation (Non-Medical): Not on file  Physical Activity:   . Days of Exercise per Week: Not on file  . Minutes of Exercise per Session: Not on file  Stress:   . Feeling of Stress : Not on file  Social Connections:   . Frequency of Communication with Friends and Family: Not on file  . Frequency of Social Gatherings with Friends and Family: Not on file  . Attends Religious Services: Not on file  . Active Member of Clubs or Organizations: Not on file  . Attends Archivist Meetings: Not on file    . Marital Status: Not on file  Intimate Partner Violence:   . Fear of Current or Ex-Partner: Not on file  . Emotionally Abused: Not on file  . Physically Abused: Not on file  . Sexually Abused: Not on file    Review of Systems  Constitutional: Negative for chills and fever.  HENT: Negative.   Cardiovascular: Negative for chest pain and leg swelling.  Musculoskeletal: Positive for gait problem and joint swelling. Negative for arthralgias, back pain, myalgias and neck pain.  Skin: Negative for rash.  Neurological: Negative for headaches.  Psychiatric/Behavioral:       See HPI  All other systems reviewed and are negative.   Objective:   Today's Vitals: BP 108/78 (BP Location: Left Arm, Patient Position: Sitting, Cuff Size: Normal)   Pulse 70   Temp 98.4 F (36.9 C) (Oral)   Ht 5\' 1"  (1.549 m)   Wt 110 lb (49.9 kg)   SpO2 99%   BMI 20.78 kg/m   Physical Exam Vitals reviewed.  Constitutional:      Appearance: Normal appearance.  HENT:     Head: Normocephalic.  Eyes:     Conjunctiva/sclera: Conjunctivae normal.     Pupils: Pupils are equal, round, and reactive to light.  Cardiovascular:     Rate and Rhythm: Normal rate and regular rhythm.     Pulses: Normal pulses.     Heart sounds: Normal heart sounds.  Pulmonary:     Effort: Pulmonary effort is normal.     Breath sounds: Normal breath sounds.  Abdominal:     Palpations: Abdomen is soft.     Tenderness: There is no abdominal tenderness.  Musculoskeletal:     Cervical back: Normal range of motion and neck supple.     Comments: Left knee without bruising, rash, bony deformity, and very slight swelling medial knee.  Mild tenderness along the medial knee with full normal extension and flexion.  She ambulates with slight limp.  Skin:    General: Skin is warm and dry.  Neurological:     General: No focal deficit present.     Mental Status: She is alert and oriented to person, place, and time.  Psychiatric:         Mood and Affect: Mood normal.        Behavior: Behavior normal.        Thought Content: Thought content normal.        Judgment: Judgment normal.     Assessment & Plan:  Problem List Items Addressed This Visit      Other   Acute pain of left knee - Primary   Relevant Orders   DG Knee Complete 4 Views Left   Ambulatory referral to Orthopedic Surgery   Encounter for medical examination to establish care      Outpatient Encounter Medications as of 10/13/2019  Medication Sig  . albuterol (VENTOLIN HFA) 108 (90 Base) MCG/ACT inhaler Inhale into the lungs every 6 (six) hours as needed for wheezing or shortness of breath.  . Prenat-FeCbn-FeAsp-Meth-FA-DHA (PRENATE MINI) 18-0.6-0.4-350 MG CAPS Take 1 capsule by mouth daily.  . sertraline (ZOLOFT) 50 MG tablet Take 1 tablet (50 mg total) by mouth daily.   No facility-administered encounter medications on file as of 10/13/2019.    Please go to the x-ray today.  I placed referral into orthopedics for your left knee injury.This may be a meniscus partial tear.   Since you still have swelling - keep using ice for 10 min x 3-4 per day and elevate and compression. Try using the left knee support sleeve. You will need to purchase at [facility] or your drug store.  Hopefully this will help stabilize the knee as you are walking.  Please take Tylenol as needed for pain.  Since you are nursing your baby, you will need to check with your OB regarding any other pain medication.   Follow-up: Return in about 1 week (around 10/20/2019).   This visit occurred during the SARS-CoV-2 public health emergency.  Safety protocols were in place, including screening questions prior to the visit, additional usage of staff PPE, and extensive cleaning of exam room while observing appropriate contact time as indicated for disinfecting solutions.   Denice Paradise, NP

## 2019-10-17 ENCOUNTER — Encounter: Payer: Self-pay | Admitting: Nurse Practitioner

## 2019-10-21 ENCOUNTER — Telehealth (INDEPENDENT_AMBULATORY_CARE_PROVIDER_SITE_OTHER): Payer: Medicaid Other | Admitting: Nurse Practitioner

## 2019-10-21 ENCOUNTER — Encounter: Payer: Self-pay | Admitting: Nurse Practitioner

## 2019-10-21 VITALS — Temp 98.2°F | Ht 61.0 in | Wt 110.0 lb

## 2019-10-21 DIAGNOSIS — M25562 Pain in left knee: Secondary | ICD-10-CM

## 2019-10-21 NOTE — Progress Notes (Signed)
Virtual Visit via Video Note  This visit type was conducted due to national recommendations for restrictions regarding the COVID-19 pandemic (e.g. social distancing).  This format is felt to be most appropriate for this patient at this time.  All issues noted in this document were discussed and addressed.  No physical exam was performed (except for noted visual exam findings with Video Visits).   I connected with@ on 10/21/19 at  9:00 AM EDT by a video enabled telemedicine application or telephone and verified that I am speaking with the correct person using two identifiers. Location patient: home Location provider: work or home office Persons participating in the virtual visit: patient, provider  I discussed the limitations, risks, security and privacy concerns of performing an evaluation and management service by telephone and the availability of in person appointments. I also discussed with the patient that there may be a patient responsible charge related to this service. The patient expressed understanding and agreed to proceed.  Reason for visit: 1 week follow-up of knee pain.  HPI: This healthy 29 year old comes in for follow-up of knee pain.  She is using the knee sleeve for support during the daytime.  She is taking Tylenol  500 mg every 6 hours. She has not tried any NSAIDs.  She has used ice, alternating heat, inserted and Epson salts.  No real improvement.  Knee still bothers her when she walks.  No swelling.  Knee x-ray showed no abnormality. She sees Ortho 10/26/2019. She has used ice alt heat and soaked in Epson salt- no difference.   CLINICAL DATA:  Twisted the knee, swollen  EXAM: LEFT KNEE - COMPLETE 4+ VIEW  COMPARISON:  None.  FINDINGS: No evidence of fracture, dislocation, or joint effusion. No evidence of arthropathy or other focal bone abnormality. Soft tissues are unremarkable.  IMPRESSION: Negative.  Electronically Signed   By: Donavan Foil M.D.   On:  10/13/2019 23:48  ROS: See pertinent positives and negatives per HPI.  Past Medical History:  Diagnosis Date  . Allergy   . Anxiety   . Asthma   . Depression   . GERD (gastroesophageal reflux disease)   . UTI (urinary tract infection)     Past Surgical History:  Procedure Laterality Date  . BREAST BIOPSY    . TONSILLECTOMY    . TONSILLECTOMY AND ADENOIDECTOMY    . WISDOM TOOTH EXTRACTION Bilateral     Family History  Problem Relation Age of Onset  . Crohn's disease Mother   . Asthma Mother   . Depression Mother   . Early death Mother   . Arthritis Father   . Depression Father   . Arthritis Paternal Grandmother   . Depression Paternal Grandmother   . Mental retardation Paternal Grandmother   . Arthritis Paternal Grandfather   . Asthma Brother     SOCIAL HX: Never used tobacco products   Current Outpatient Medications:  .  albuterol (VENTOLIN HFA) 108 (90 Base) MCG/ACT inhaler, Inhale into the lungs every 6 (six) hours as needed for wheezing or shortness of breath., Disp: , Rfl:  .  Prenat-FeCbn-FeAsp-Meth-FA-DHA (PRENATE MINI) 18-0.6-0.4-350 MG CAPS, Take 1 capsule by mouth daily., Disp: 30 capsule, Rfl: 11 .  sertraline (ZOLOFT) 50 MG tablet, Take 1 tablet (50 mg total) by mouth daily., Disp: 30 tablet, Rfl: 5  EXAM:  VITALS per patient if applicable:  GENERAL: alert, oriented, appears well and in no acute distress  HEENT: atraumatic, conjunttiva clear, no obvious abnormalities on inspection of external  nose and ears  NECK: normal movements of the head and neck  LUNGS: on inspection no signs of respiratory distress, breathing rate appears normal, no obvious gross SOB, gasping or wheezing  CV: no obvious cyanosis  MS: moves all visible extremities without noticeable abnormality, knee exam via video shows no gross abnormality.  PSYCH/NEURO: pleasant and cooperative, no obvious depression or anxiety, speech and thought processing grossly intact  ASSESSMENT  AND PLAN:  Discussed the following assessment and plan:  Acute pain of left knee  No problem-specific Assessment & Plan notes found for this encounter.  Patient advised to try Aleve 1 daily for anti-inflammatory properties.  Continue to wear the knee support sleeve when awake. Elevate, ice if swollen.  Keep office visit with Ortho as planned.  I discussed the assessment and treatment plan with the patient. The patient was provided an opportunity to ask questions and all were answered. The patient agreed with the plan and demonstrated an understanding of the instructions.   The patient was advised to call back or seek an in-person evaluation if the symptoms worsen or if the condition fails to improve as anticipated.  Denice Paradise, NP Adult Nurse Practitioner [facility] [phone]

## 2019-10-23 ENCOUNTER — Encounter: Payer: Self-pay | Admitting: Nurse Practitioner

## 2019-10-23 NOTE — Patient Instructions (Addendum)
Patient advised to try Aleve 1 daily for anti-inflammatory properties.  Continue to wear the knee support sleeve when awake.  Elevate, ice if swollen.  Keep office visit with Ortho as planned.

## 2019-12-27 ENCOUNTER — Telehealth: Payer: Self-pay

## 2019-12-27 ENCOUNTER — Other Ambulatory Visit: Payer: Self-pay | Admitting: Obstetrics & Gynecology

## 2019-12-27 MED ORDER — SERTRALINE HCL 50 MG PO TABS: 50.0000 mg | ORAL_TABLET | Freq: Every day | ORAL | 1 refills | Status: DC

## 2019-12-27 NOTE — Telephone Encounter (Signed)
Patient is scheduled for 01/31/19 with [facility]

## 2019-12-27 NOTE — Telephone Encounter (Signed)
Sch annual as available in dec/jan.  Refill sent.

## 2019-12-27 NOTE — Telephone Encounter (Signed)
Refill Request received from pharmacy via fax for Sertraline.

## 2020-01-31 ENCOUNTER — Ambulatory Visit (INDEPENDENT_AMBULATORY_CARE_PROVIDER_SITE_OTHER): Payer: Medicaid Other | Admitting: Obstetrics & Gynecology

## 2020-01-31 ENCOUNTER — Other Ambulatory Visit: Payer: Self-pay

## 2020-01-31 ENCOUNTER — Encounter: Payer: Self-pay | Admitting: Obstetrics & Gynecology

## 2020-01-31 ENCOUNTER — Other Ambulatory Visit (HOSPITAL_COMMUNITY)
Admission: RE | Admit: 2020-01-31 | Discharge: 2020-01-31 | Disposition: A | Discharge status: Home / Self Care | Payer: Medicaid Other | Source: Ambulatory Visit | Attending: Obstetrics & Gynecology | Admitting: Obstetrics & Gynecology

## 2020-01-31 VITALS — BP 100/60 | Ht 61.0 in | Wt 108.0 lb

## 2020-01-31 DIAGNOSIS — Z124 Encounter for screening for malignant neoplasm of cervix: Secondary | ICD-10-CM | POA: Insufficient documentation

## 2020-01-31 DIAGNOSIS — R233 Spontaneous ecchymoses: Secondary | ICD-10-CM

## 2020-01-31 DIAGNOSIS — R238 Other skin changes: Secondary | ICD-10-CM | POA: Diagnosis not present

## 2020-01-31 DIAGNOSIS — Z30431 Encounter for routine checking of intrauterine contraceptive device: Secondary | ICD-10-CM | POA: Diagnosis not present

## 2020-01-31 DIAGNOSIS — Z01419 Encounter for gynecological examination (general) (routine) without abnormal findings: Secondary | ICD-10-CM

## 2020-01-31 MED ORDER — SERTRALINE HCL 50 MG PO TABS: 50.0000 mg | ORAL_TABLET | Freq: Every day | ORAL | 3 refills | Status: DC

## 2020-01-31 NOTE — Patient Instructions (Signed)
Thank you for choosing [facility]. As part of our ongoing efforts to improve patient experience, we would appreciate your feedback. Please fill out the short survey that you will receive by mail or MyChart. Your opinion is important to us! -Dr Alandis Bluemel  Recommendations to boost your immunity to prevent illness such as viral flu and colds, including covid19, are as follows:       - - -  Vitamin K2 and Vitamin D3  - - - Take Vitamin K2 at 200-300 mcg daily (usually 2-3 pills daily of the over the counter formulation). Take Vitamin D3 at 3000-4000 U daily (usually 3-4 pills daily of the over the counter formulation). Studies show that these two at high normal levels in your system are very effective in keeping your immunity so strong and protective that you will be unlikely to contract viral illness such as those listed above.  Dr Tashi Band  

## 2020-01-31 NOTE — Progress Notes (Signed)
HPI:      Jeanette Miranda is a 30 y.o. [phone] who LMP was No LMP recorded. (Menstrual status: IUD)., she presents today for her annual examination. The patient has no complaints today. The patient is sexually active. Her last pap: approximate date 2020 and was normal. The patient does perform self breast exams.  There is no notable family history of breast or ovarian cancer in her family.  The patient has regular exercise: yes.  The patient denies current symptoms of depression.    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 Allergy   . Anxiety   . Asthma   . Depression   . GERD (gastroesophageal reflux disease)   . UTI (urinary tract infection)    Past Surgical History:  Procedure Laterality Date  . BREAST BIOPSY    . TONSILLECTOMY    . TONSILLECTOMY AND ADENOIDECTOMY    . WISDOM TOOTH EXTRACTION Bilateral    Family History  Problem Relation Age of Onset  . Crohn's disease Mother   . Asthma Mother   . Depression Mother   . Early death Mother   . Arthritis Father   . Depression Father   . Arthritis Paternal Grandmother   . Depression Paternal Grandmother   . Mental retardation Paternal Grandmother   . Arthritis Paternal Grandfather   . Asthma Brother    Social History   Tobacco Use  . Smoking status: Never Smoker  . Smokeless tobacco: Never Used  Vaping Use  . Vaping Use: Never used  Substance Use Topics  . Alcohol use: No  . Drug use: No    Current Outpatient Medications:  .  albuterol (VENTOLIN HFA) 108 (90 Base) MCG/ACT inhaler, Inhale into the lungs every 6 (six) hours as needed for wheezing or shortness of breath., Disp: , Rfl:  .  levonorgestrel (MIRENA) 20 MCG/24HR IUD, 1 each by Intrauterine route once., Disp: , Rfl:  .  Prenat-FeCbn-FeAsp-Meth-FA-DHA (PRENATE MINI) 18-0.6-0.4-350 MG CAPS, Take 1 capsule by mouth daily.,  Disp: 30 capsule, Rfl: 11 .  sertraline (ZOLOFT) 50 MG tablet, Take 1 tablet (50 mg total) by mouth daily., Disp: 90 tablet, Rfl: 3 Allergies: Reglan [metoclopramide]  Review of Systems  Constitutional: Negative for chills, fever and malaise/fatigue.  HENT: Negative for congestion, sinus pain and sore throat.   Eyes: Negative for blurred vision and pain.  Respiratory: Negative for cough and wheezing.   Cardiovascular: Negative for chest pain and leg swelling.  Gastrointestinal: Negative for abdominal pain, constipation, diarrhea, heartburn, nausea and vomiting.  Genitourinary: Negative for dysuria, frequency, hematuria and urgency.  Musculoskeletal: Negative for back pain, joint pain, myalgias and neck pain.  Skin: Negative for itching and rash.  Neurological: Negative for dizziness, tremors and weakness.  Endo/Heme/Allergies: Bruises/bleeds easily.  Psychiatric/Behavioral: Negative for depression. The patient is not nervous/anxious and does not have insomnia.     Objective: BP 100/60   Ht 5\' 1"  (1.549 m)   Wt 108 lb (49 kg)   Breastfeeding Yes   BMI 20.41 kg/m   Filed Weights   01/31/20 0922  Weight: 108 lb (49 kg)   Body mass index is 20.41 kg/m. Physical Exam Constitutional:      General: She is not in acute distress.    Appearance: She is well-developed and well-nourished.  Genitourinary:     Vagina, uterus  and rectum normal.     There is no rash or lesion on the right labia.     There is no rash or lesion on the left labia.    No lesions in the vagina.     No vaginal bleeding.      Right Adnexa: not tender and no mass present.    Left Adnexa: not tender and no mass present.    No cervical motion tenderness, friability, lesion or polyp.     IUD strings visualized.     Uterus is mobile.     Uterus is not enlarged.     No uterine mass detected.    Uterus is midaxial.     Pelvic exam was performed with patient supine.  Breasts:     Right: No mass, skin change or  tenderness.     Left: No mass, skin change or tenderness.    HENT:     Head: Normocephalic and atraumatic. No laceration.     Right Ear: Hearing normal.     Left Ear: Hearing normal.     Nose: No epistaxis or foreign body.     Mouth/Throat:     Mouth: Oropharynx is clear and moist and mucous membranes are normal.     Pharynx: Uvula midline.  Eyes:     Pupils: Pupils are equal, round, and reactive to light.  Neck:     Thyroid: No thyromegaly.  Cardiovascular:     Rate and Rhythm: Normal rate and regular rhythm.     Heart sounds: No murmur heard. No friction rub. No gallop.   Pulmonary:     Effort: Pulmonary effort is normal. No respiratory distress.     Breath sounds: Normal breath sounds. No wheezing.  Abdominal:     General: Bowel sounds are normal. There is no distension.     Palpations: Abdomen is soft.     Tenderness: There is no abdominal tenderness. There is no rebound.  Musculoskeletal:        General: Normal range of motion.     Cervical back: Normal range of motion and neck supple.  Neurological:     Mental Status: She is alert and oriented to person, place, and time.     Cranial Nerves: No cranial nerve deficit.  Skin:    General: Skin is warm and dry.  Psychiatric:        Mood and Affect: Mood and affect normal.        Judgment: Judgment normal.  Vitals reviewed.     Assessment:  ANNUAL EXAM 1. Women's annual routine gynecological examination   2. Screening for cervical cancer   3. Contraceptive, surveillance, intrauterine device   4. Easy bruising      Screening Plan:            1.  Cervical Screening-  Pap smear done today  2. Breast screening- Exam annually and mammogram>40 planned   3. Labs Ordered today  4. Counseling for contraception: IUD Contraceptive, surveillance, intrauterine device   5. Easy bruising Labs to assess - CBC - TSH   6. Zoloft for PPD, continue to use at this lower dose     F/U  Return in about 1 year (around  01/30/2021) for Annual.  Annamarie Major, MD, [facility], [facility] 01/31/2020  9:44 AM

## 2020-02-01 LAB — CBC
Hematocrit: 42.1 % (ref 34.0–46.6)
Hemoglobin: 14.5 g/dL (ref 11.1–15.9)
MCH: 30.3 pg (ref 26.6–33.0)
MCHC: 34.4 g/dL (ref 31.5–35.7)
MCV: 88 fL (ref 79–97)
Platelets: 187 10*3/uL (ref 150–450)
RBC: 4.78 x10E6/uL (ref 3.77–5.28)
RDW: 12.2 % (ref 11.7–15.4)
WBC: 6.6 10*3/uL (ref 3.4–10.8)

## 2020-02-01 LAB — TSH: TSH: 2.06 u[IU]/mL (ref 0.450–4.500)

## 2020-02-02 LAB — CYTOLOGY - PAP: Diagnosis: NEGATIVE

## 2020-03-01 ENCOUNTER — Other Ambulatory Visit: Payer: Medicaid Other

## 2020-03-01 DIAGNOSIS — Z20822 Contact with and (suspected) exposure to covid-19: Secondary | ICD-10-CM

## 2020-03-02 LAB — SARS-COV-2, NAA 2 DAY TAT

## 2020-03-02 LAB — NOVEL CORONAVIRUS, NAA: SARS-CoV-2, NAA: NOT DETECTED

## 2020-03-31 ENCOUNTER — Other Ambulatory Visit: Payer: Self-pay | Admitting: Obstetrics & Gynecology

## 2020-03-31 MED ORDER — ALBUTEROL SULFATE HFA 108 (90 BASE) MCG/ACT IN AERS: 1.0000 | INHALATION_SPRAY | Freq: Four times a day (QID) | RESPIRATORY_TRACT | 1 refills | Status: DC | PRN

## 2020-06-05 ENCOUNTER — Other Ambulatory Visit: Payer: Self-pay | Admitting: Obstetrics & Gynecology

## 2020-06-05 ENCOUNTER — Other Ambulatory Visit: Payer: Self-pay

## 2020-06-05 ENCOUNTER — Ambulatory Visit (INDEPENDENT_AMBULATORY_CARE_PROVIDER_SITE_OTHER): Payer: Medicaid Other

## 2020-06-05 DIAGNOSIS — N3001 Acute cystitis with hematuria: Secondary | ICD-10-CM

## 2020-06-05 LAB — POCT URINALYSIS DIPSTICK
Glucose, UA: NEGATIVE
Ketones, UA: NEGATIVE
Nitrite, UA: POSITIVE
Protein, UA: POSITIVE — AB
Spec Grav, UA: >=1.03 — AB (ref 1.010–1.025)
Urobilinogen, UA: NEGATIVE E.U./dL — AB
pH, UA: 5 (ref 5.0–8.0)

## 2020-06-05 MED ORDER — SULFAMETHOXAZOLE-TRIMETHOPRIM 800-160 MG PO TABS: 1.0000 | ORAL_TABLET | Freq: Two times a day (BID) | ORAL | 0 refills | Status: AC

## 2020-06-05 MED ORDER — SULFAMETHOXAZOLE-TRIMETHOPRIM 800-160 MG PO TABS
1.0000 | ORAL_TABLET | Freq: Two times a day (BID) | ORAL | 0 refills | Status: DC
Start: 2020-06-05 — End: 2020-06-05

## 2020-06-05 NOTE — Telephone Encounter (Signed)
Pt called after hour nurse 06/02/20 7:31pm stating she needs rx for UTI - pelvic pain, frequent urination, dysuria; started 1-2 day ago; no fever; no blood in urine; no back/side pain.  [phone]  After hour nurse adv pt to be seen within 24hrs.  Nothing seen in Care Everywhere.

## 2020-06-05 NOTE — Progress Notes (Signed)
Pt came in to drop off a clean catch urine sample.  Please see u/a.  Will send for culture.

## 2020-06-05 NOTE — Progress Notes (Signed)
Was she having sx's? If so I will call in medcine to go ahead and start. Let me know.  Culture ordered.

## 2020-06-05 NOTE — Telephone Encounter (Signed)
Patient is scheduled for 06/05/20 at 11:20

## 2020-06-09 LAB — URINE CULTURE

## 2020-06-11 ENCOUNTER — Other Ambulatory Visit: Payer: Self-pay | Admitting: Obstetrics & Gynecology

## 2020-06-11 MED ORDER — AMOXICILLIN-POT CLAVULANATE 875-125 MG PO TABS: 1.0000 | ORAL_TABLET | Freq: Two times a day (BID) | ORAL | 0 refills | Status: AC

## 2020-06-14 NOTE — Telephone Encounter (Signed)
Can wait

## 2020-10-28 ENCOUNTER — Other Ambulatory Visit: Payer: Self-pay | Admitting: Obstetrics & Gynecology

## 2020-10-30 NOTE — Telephone Encounter (Signed)
Hey Jeanette Miranda this Jeanette Miranda with [facility] trying to reach you to scheduled you annual exam. Please contact our office so we can get this scheduled. Thank you! [phone]

## 2020-10-30 NOTE — Telephone Encounter (Signed)
Sch Annual in Jan

## 2020-12-26 NOTE — Telephone Encounter (Signed)
Patient r/s to 08/06/2019. Mirena rcvd/charged 08/06/2019

## 2021-03-19 ENCOUNTER — Ambulatory Visit (INDEPENDENT_AMBULATORY_CARE_PROVIDER_SITE_OTHER): Payer: Medicaid Other | Admitting: Family Medicine

## 2021-03-19 ENCOUNTER — Other Ambulatory Visit: Payer: Self-pay

## 2021-03-19 ENCOUNTER — Encounter: Payer: Self-pay | Admitting: Family Medicine

## 2021-03-19 VITALS — BP 108/74 | HR 75 | Ht 61.0 in | Wt 121.0 lb

## 2021-03-19 DIAGNOSIS — F32A Depression, unspecified: Secondary | ICD-10-CM

## 2021-03-19 DIAGNOSIS — J45909 Unspecified asthma, uncomplicated: Secondary | ICD-10-CM | POA: Diagnosis not present

## 2021-03-19 DIAGNOSIS — F419 Anxiety disorder, unspecified: Secondary | ICD-10-CM | POA: Diagnosis not present

## 2021-03-19 MED ORDER — SERTRALINE HCL 100 MG PO TABS: 100.0000 mg | ORAL_TABLET | Freq: Every day | ORAL | 1 refills | Status: DC

## 2021-03-19 MED ORDER — ALBUTEROL SULFATE HFA 108 (90 BASE) MCG/ACT IN AERS: 1.0000 | INHALATION_SPRAY | Freq: Four times a day (QID) | RESPIRATORY_TRACT | 2 refills | Status: AC | PRN

## 2021-03-19 NOTE — Assessment & Plan Note (Signed)
Longstanding issue, overall well controlled with as needed albuterol.  When asked about usage of albuterol, she primarily finds that she has shortness of breath during anxiety attacks and utilizes the albuterol to address this.  I discussed the mechanism of albuterol and its relation to tachycardia, in turn to anxiety.  We have discussed biofeedback methods, refilled albuterol, can follow-up on this issue at her return.

## 2021-03-19 NOTE — Assessment & Plan Note (Signed)
Patient presents for establishment of care, primary concern today regarding longstanding history of anxiety/depression.  Treatments over the past include varying doses of sertraline, virtual therapy sessions.  She has noted recent increased life stressors focal around her 30-year-old son and associated childcare needs.  Has been working through these with her therapist.  We discussed both pharmacologic and nonpharmacologic treatment strategies to gain tighter control on her symptoms given the GAD scores noted today.  She is amenable to titration of sertraline from 50 mg to 100 mg.  Additionally, I have encouraged her to look into exercise and mindfulness therapy as adjuncts to her ongoing virtual therapy sessions.  We will have her return in 6 weeks for reevaluation.  If suboptimal progress noted despite this dose increase, can consider adjunct buspirone, referral to psychiatry, further titration, all as clinically guided.  Chronic condition, symptomatic, Rx management

## 2021-03-19 NOTE — Patient Instructions (Signed)
-   Start new dose sertraline 100 mg nightly - Review information provided on nonmedication based ways to control mood - Continue your regular follow-up with therapist - Return for follow-up in 6 weeks for annual physical - Contact us for any questions between now and then

## 2021-03-19 NOTE — Progress Notes (Signed)
°  ° °  Primary Care / Sports Medicine Office Visit  Patient Information:  Patient ID: Jeanette Miranda, female DOB: 10-10-1990 Age: 31 y.o. MRN: [id number]   Jeanette Miranda is a pleasant 31 y.o. female presenting with the following:  Chief Complaint  Patient presents with   New Patient (Initial Visit)   Establish Care   Depression   Anxiety    Vitals:   03/19/21 0845  BP: 108/74  Pulse: 75  SpO2: 99%   Vitals:   03/19/21 0845  Weight: 121 lb (54.9 kg)  Height: 5\' 1"  (1.549 m)   Body mass index is 22.86 kg/m.  No results found.   Independent interpretation of notes and tests performed by another provider:   None  Procedures performed:   None  Pertinent History, Exam, Impression, and Recommendations:   Anxiety and depression Patient presents for establishment of care, primary concern today regarding longstanding history of anxiety/depression.  Treatments over the past include varying doses of sertraline, virtual therapy sessions.  She has noted recent increased life stressors focal around her 54-year-old son and associated childcare needs.  Has been working through these with her therapist.  We discussed both pharmacologic and nonpharmacologic treatment strategies to gain tighter control on her symptoms given the GAD scores noted today.  She is amenable to titration of sertraline from 50 mg to 100 mg.  Additionally, I have encouraged her to look into exercise and mindfulness therapy as adjuncts to her ongoing virtual therapy sessions.  We will have her return in 6 weeks for reevaluation.  If suboptimal progress noted despite this dose increase, can consider adjunct buspirone, referral to psychiatry, further titration, all as clinically guided.  Chronic condition, symptomatic, Rx management  Asthma Longstanding issue, overall well controlled with as needed albuterol.  When asked about usage of albuterol, she primarily finds that she has shortness of breath during anxiety  attacks and utilizes the albuterol to address this.  I discussed the mechanism of albuterol and its relation to tachycardia, in turn to anxiety.  We have discussed biofeedback methods, refilled albuterol, can follow-up on this issue at her return.   Orders & Medications Meds ordered this encounter  Medications   sertraline (ZOLOFT) 100 MG tablet    Sig: Take 1 tablet (100 mg total) by mouth daily.    Dispense:  30 tablet    Refill:  1   albuterol (VENTOLIN HFA) 108 (90 Base) MCG/ACT inhaler    Sig: Inhale 1-2 puffs into the lungs every 6 (six) hours as needed for wheezing or shortness of breath.    Dispense:  6.7 g    Refill:  2   No orders of the defined types were placed in this encounter.    Return in about 6 weeks (around 04/30/2021) for Annual Physical.     Montel Culver, MD   [place]

## 2021-04-12 ENCOUNTER — Other Ambulatory Visit: Payer: Self-pay | Admitting: Family Medicine

## 2021-04-12 NOTE — Telephone Encounter (Signed)
Requested medication (s) are due for refill today:   No ? ?Requested medication (s) are on the active medication list:   Yes ? ?Future visit scheduled:   Yes in 2 wks with Dr. Zigmund Daniel ? ? ?Last ordered: 03/19/2021 #30, 1 refill ? ?Returned because it's a non delegated refill plus a 90 day supply and a DX Code are being requested.  ? ?Requested Prescriptions  ?Pending Prescriptions Disp Refills  ? sertraline (ZOLOFT) 100 MG tablet [Pharmacy Med Name: SERTRALINE HCL 100 MG TABLET] 90 tablet 1  ?  Sig: TAKE 1 TABLET BY MOUTH EVERY DAY  ?  ? Not Delegated - Psychiatry:  Antidepressants - SSRI - sertraline Failed - 04/12/2021  1:31 PM  ?  ?  Failed - This refill cannot be delegated  ?  ?  Failed - AST in normal range and within 360 days  ?  AST  ?Date Value Ref Range Status  ?11/13/2018 16 15 - 41 U/L Final  ? ?SGOT(AST)  ?Date Value Ref Range Status  ?10/29/2011 22 0 - 26 Unit/L Final  ?  ?  ?  ?  Failed - ALT in normal range and within 360 days  ?  ALT  ?Date Value Ref Range Status  ?11/13/2018 15 0 - 44 U/L Final  ? ?SGPT (ALT)  ?Date Value Ref Range Status  ?10/29/2011 19 12 - 78 U/L Final  ?  ?  ?  ?  Passed - Completed PHQ-2 or PHQ-9 in the last 360 days  ?  ?  Passed - Valid encounter within last 6 months  ?  Recent Outpatient Visits   ? ?      ? 3 weeks ago Anxiety and depression  ? [facility] Montel Culver, MD  ? ?  ?  ?Future Appointments   ? ?        ? In 2 weeks Zigmund Daniel Earley Abide, MD [facility], [place]  ? ?  ? ?  ?  ?  ? ?

## 2021-04-21 ENCOUNTER — Other Ambulatory Visit: Payer: Self-pay | Admitting: Obstetrics & Gynecology

## 2021-04-30 ENCOUNTER — Encounter: Payer: Self-pay | Admitting: Family Medicine

## 2021-04-30 ENCOUNTER — Ambulatory Visit (INDEPENDENT_AMBULATORY_CARE_PROVIDER_SITE_OTHER): Payer: Medicaid Other | Admitting: Family Medicine

## 2021-04-30 VITALS — BP 118/68 | HR 78 | Ht 61.0 in | Wt 120.6 lb

## 2021-04-30 DIAGNOSIS — Z1159 Encounter for screening for other viral diseases: Secondary | ICD-10-CM

## 2021-04-30 DIAGNOSIS — K219 Gastro-esophageal reflux disease without esophagitis: Secondary | ICD-10-CM

## 2021-04-30 DIAGNOSIS — Z114 Encounter for screening for human immunodeficiency virus [HIV]: Secondary | ICD-10-CM | POA: Diagnosis not present

## 2021-04-30 DIAGNOSIS — Z13220 Encounter for screening for lipoid disorders: Secondary | ICD-10-CM

## 2021-04-30 DIAGNOSIS — Z0000 Encounter for general adult medical examination without abnormal findings: Secondary | ICD-10-CM

## 2021-04-30 DIAGNOSIS — F32A Depression, unspecified: Secondary | ICD-10-CM

## 2021-04-30 DIAGNOSIS — R7989 Other specified abnormal findings of blood chemistry: Secondary | ICD-10-CM

## 2021-04-30 DIAGNOSIS — J45909 Unspecified asthma, uncomplicated: Secondary | ICD-10-CM

## 2021-04-30 DIAGNOSIS — L708 Other acne: Secondary | ICD-10-CM

## 2021-04-30 DIAGNOSIS — F419 Anxiety disorder, unspecified: Secondary | ICD-10-CM

## 2021-04-30 MED ORDER — SERTRALINE HCL 100 MG PO TABS: 100.0000 mg | ORAL_TABLET | Freq: Every day | ORAL | 0 refills | Status: DC

## 2021-04-30 NOTE — Progress Notes (Signed)
?  ? ?Annual Physical Exam Visit ? ?Patient Information:  ?Patient ID: Jeanette Miranda, female DOB: 08-17-1990 Age: 31 y.o. MRN: [id number]  ? ?Subjective:  ? ?CC: Annual Physical Exam ? ?HPI:  ?Jeanette Miranda is here for their annual physical. ? ?I reviewed the past medical history, family history, social history, surgical history, and allergies today and changes were made as necessary.  Please see the problem list section below for additional details. ? ?Past Medical History: ?Past Medical History:  ?Diagnosis Date  ? Allergy   ? Asthma   ? GERD (gastroesophageal reflux disease)   ? Hyperemesis 11/14/2018  ? UTI (urinary tract infection)   ? ?Past Surgical History: ?Past Surgical History:  ?Procedure Laterality Date  ? BREAST BIOPSY Right 2019  ? benign and currently asymptomatic  ? TONSILLECTOMY AND ADENOIDECTOMY  2012  ? WISDOM TOOTH EXTRACTION Bilateral 2011  ? ?Family History: ?Family History  ?Problem Relation Age of Onset  ? Crohn's disease Mother   ? Asthma Mother   ? Depression Mother   ? Early death Mother   ? Anxiety disorder Mother   ? Arthritis Father   ? Depression Father   ? Anxiety disorder Father   ? Glaucoma Father   ? Asthma Brother   ? Heart disease Maternal Grandfather   ? Arthritis Paternal Grandmother   ? Depression Paternal Grandmother   ? Mental retardation Paternal Grandmother   ? Heart disease Paternal Grandfather   ? Arthritis Paternal Grandfather   ? ?Allergies: ?Allergies  ?Allergen Reactions  ? Reglan [Metoclopramide] Other (See Comments)  ?  Hot flash, unpleasant feeling ?  ? ?Health Maintenance: ?Health Maintenance  ?Topic Date Due  ? Hepatitis C Screening  Never done  ? COVID-19 Vaccine (3 - Booster for Pfizer series) 05/27/2021 (Originally 04/20/2020)  ? INFLUENZA VACCINE  08/28/2021  ? PAP-Cervical Cytology Screening  01/31/2023  ? PAP SMEAR-Modifier  01/31/2023  ? TETANUS/TDAP  04/19/2029  ? HIV Screening  Completed  ? HPV VACCINES  Aged Out  ?  ?HM Colonoscopy   ? ? This  patient has no relevant Health Maintenance data.  ? ?  ? ?Medications: ?Current Outpatient Medications on File Prior to Visit  ?Medication Sig Dispense Refill  ? albuterol (VENTOLIN HFA) 108 (90 Base) MCG/ACT inhaler Inhale 1-2 puffs into the lungs every 6 (six) hours as needed for wheezing or shortness of breath. 6.7 g 2  ? levonorgestrel (MIRENA) 20 MCG/24HR IUD 1 each by Intrauterine route once.    ? ?No current facility-administered medications on file prior to visit.  ? ? ?Review of Systems: No headache, visual changes, nausea, vomiting, diarrhea, constipation, dizziness, abdominal pain, skin rash, fevers, chills, night sweats, swollen lymph nodes, weight loss, chest pain, body aches, joint swelling, muscle aches, shortness of breath, mood changes, visual or auditory hallucinations reported. ? ?Objective:  ? ?Vitals:  ? 04/30/21 0923  ?BP: 118/68  ?Pulse: 78  ?SpO2: 98%  ? ?Vitals:  ? 04/30/21 0923  ?Weight: 120 lb 9.6 oz (54.7 kg)  ?Height: '5\' 1"'$  (1.549 m)  ? ?Body mass index is 22.79 kg/m?. ? ?General: Well Developed, well nourished, and in no acute distress.  ?Neuro: Alert and oriented x3, extra-ocular muscles intact, sensation grossly intact. Cranial nerves II through XII are grossly intact, motor, sensory, and coordinative functions are intact. ?HEENT: Normocephalic, atraumatic, pupils equal round reactive to light, neck supple, no masses, no lymphadenopathy, thyroid nonpalpable. Oropharynx, nasopharynx, external ear canals are unremarkable. ?Skin: Warm and dry,  mild acne on face, acneiform lesion central upper chest  ?Cardiac: Regular rate and rhythm, no murmurs rubs or gallops. No peripheral edema. Pulses symmetric. ?Respiratory: Clear to auscultation bilaterally. Not using accessory muscles, speaking in full sentences.  ?Abdominal: Soft, nontender, nondistended, positive bowel sounds, no masses, no organomegaly. ?Musculoskeletal: Shoulder, elbow, wrist, hip, knee, ankle stable, and with full range of  motion. ? ?Female chaperone initials: [place] present throughout the physical examination. ? ?Impression and Recommendations:  ? ?The patient was counselled, risk factors were discussed, and anticipatory guidance given. ? ?Asthma ?Currently well controlled, since titration of sertraline 200 mg, she has not required albuterol ? ?GERD (gastroesophageal reflux disease) ?Currently well controlled with dietary measures ? ?Annual physical exam ?Annual examination completed, risk stratification labs ordered, anticipatory guidance provided.  We will follow labs once resulted. ? ?Anxiety and depression ?Significant improvement in GAD scores, stable low PHQ scores since titration of sertraline from 50 mg to 100 mg daily.  No adverse effects reported other than initial transient GI disturbance which has long since resolved.  90-day refill sent, she can contact us for any issues regarding this matter. ? ?Acneiform rash ?ManagedIncidentally noted on today's examination, she is amenable to further management by dermatology.  Referral placed today. ? ? ?Orders & Medications ?Medications:  ?Meds ordered this encounter  ?Medications  ? sertraline (ZOLOFT) 100 MG tablet  ?  Sig: Take 1 tablet (100 mg total) by mouth daily.  ?  Dispense:  90 tablet  ?  Refill:  0  ? ?Orders Placed This Encounter  ?Procedures  ? Apo A1 + B + Ratio  ? CBC  ? Comprehensive metabolic panel  ? Hepatitis C antibody  ? VITAMIN D 25 Hydroxy (Vit-D Deficiency, Fractures)  ? TSH  ? Lipid panel  ? HIV Antibody (routine testing w rflx)  ? Ambulatory referral to Dermatology  ?  ? ?Return in about 1 year (around 05/01/2022) for Annual Physical.  ? ? ?Montel Culver, MD ? ? Primary Care Sports Medicine ?[facility] ?[place]  ? ?

## 2021-04-30 NOTE — Assessment & Plan Note (Signed)
Currently well controlled, since titration of sertraline 200 mg, she has not required albuterol ?

## 2021-04-30 NOTE — Assessment & Plan Note (Signed)
Significant improvement in GAD scores, stable low PHQ scores since titration of sertraline from 50 mg to 100 mg daily.  No adverse effects reported other than initial transient GI disturbance which has long since resolved.  90-day refill sent, she can contact us for any issues regarding this matter. ?

## 2021-04-30 NOTE — Assessment & Plan Note (Signed)
Currently well controlled with dietary measures ?

## 2021-04-30 NOTE — Patient Instructions (Addendum)
-   Obtain fasting labs with orders provided (can have water or black coffee but otherwise no food or drink x 8 hours before labs) ?- Please forward MRI results of the knee, after doing so schedule a visit with our office ?- Review information provided ?- Attend eye doctor annually, dentist every 6 months, work towards or maintain 30 minutes of moderate intensity physical activity at least 5 days per week, and consume a balanced diet ?- Return in 1 year for physical ?- Contact us for any questions between now and then ?

## 2021-04-30 NOTE — Assessment & Plan Note (Signed)
Annual examination completed, risk stratification labs ordered, anticipatory guidance provided.  We will follow labs once resulted. 

## 2021-04-30 NOTE — Assessment & Plan Note (Addendum)
ManagedIncidentally noted on today's examination, she is amenable to further management by dermatology.  Referral placed today. ?

## 2021-05-01 NOTE — Telephone Encounter (Signed)
Please review.  KP

## 2021-05-01 NOTE — Telephone Encounter (Signed)
Requested MRI results via Fax. ? ?KP

## 2021-05-04 ENCOUNTER — Other Ambulatory Visit: Payer: Self-pay | Admitting: Family Medicine

## 2021-05-04 DIAGNOSIS — R7989 Other specified abnormal findings of blood chemistry: Secondary | ICD-10-CM

## 2021-05-04 LAB — COMPREHENSIVE METABOLIC PANEL
ALT: 14 IU/L (ref 0–32)
AST: 18 IU/L (ref 0–40)
Albumin/Globulin Ratio: 2.6 — ABNORMAL HIGH (ref 1.2–2.2)
Albumin: 4.6 g/dL (ref 3.9–5.0)
Alkaline Phosphatase: 83 IU/L (ref 44–121)
BUN/Creatinine Ratio: 17 (ref 9–23)
BUN: 11 mg/dL (ref 6–20)
Bilirubin Total: 0.4 mg/dL (ref 0.0–1.2)
CO2: 26 mmol/L (ref 20–29)
Calcium: 9.3 mg/dL (ref 8.7–10.2)
Chloride: 101 mmol/L (ref 96–106)
Creatinine, Ser: 0.66 mg/dL (ref 0.57–1.00)
Globulin, Total: 1.8 g/dL (ref 1.5–4.5)
Glucose: 88 mg/dL (ref 70–99)
Potassium: 3.8 mmol/L (ref 3.5–5.2)
Sodium: 142 mmol/L (ref 134–144)
Total Protein: 6.4 g/dL (ref 6.0–8.5)
eGFR: 122 mL/min/{1.73_m2} (ref >59)

## 2021-05-04 LAB — APO A1 + B + RATIO
Apolipo. B/A-1 Ratio: 0.4 ratio (ref 0.0–0.6)
Apolipoprotein A-1: 138 mg/dL (ref 116–209)
Apolipoprotein B: 59 mg/dL (ref <90)

## 2021-05-04 LAB — CBC
Hematocrit: 40.2 % (ref 34.0–46.6)
Hemoglobin: 13.5 g/dL (ref 11.1–15.9)
MCH: 28.7 pg (ref 26.6–33.0)
MCHC: 33.6 g/dL (ref 31.5–35.7)
MCV: 86 fL (ref 79–97)
Platelets: 204 10*3/uL (ref 150–450)
RBC: 4.7 x10E6/uL (ref 3.77–5.28)
RDW: 12.3 % (ref 11.7–15.4)
WBC: 5.2 10*3/uL (ref 3.4–10.8)

## 2021-05-04 LAB — LIPID PANEL
Chol/HDL Ratio: 2.2 ratio (ref 0.0–4.4)
Cholesterol, Total: 136 mg/dL (ref 100–199)
HDL: 62 mg/dL (ref >39)
LDL Chol Calc (NIH): 63 mg/dL (ref 0–99)
Triglycerides: 50 mg/dL (ref 0–149)
VLDL Cholesterol Cal: 11 mg/dL (ref 5–40)

## 2021-05-04 LAB — HIV ANTIBODY (ROUTINE TESTING W REFLEX): HIV Screen 4th Generation wRfx: NONREACTIVE

## 2021-05-04 LAB — HEPATITIS C ANTIBODY: Hep C Virus Ab: NONREACTIVE

## 2021-05-04 LAB — VITAMIN D 25 HYDROXY (VIT D DEFICIENCY, FRACTURES): Vit D, 25-Hydroxy: 24.4 ng/mL — ABNORMAL LOW (ref 30.0–100.0)

## 2021-05-04 LAB — TSH: TSH: 1.02 u[IU]/mL (ref 0.450–4.500)

## 2021-05-04 MED ORDER — VITAMIN D (ERGOCALCIFEROL) 1.25 MG (50000 UNIT) PO CAPS: 50000.0000 [IU] | ORAL_CAPSULE | ORAL | 0 refills | Status: DC

## 2021-05-04 NOTE — Telephone Encounter (Signed)
Called [facility] to send MRI results. Waiting on fax. ? ?KP ?

## 2021-05-14 ENCOUNTER — Ambulatory Visit: Payer: Medicaid Other | Admitting: Family Medicine

## 2021-05-18 ENCOUNTER — Ambulatory Visit: Payer: Medicaid Other | Admitting: Family Medicine

## 2021-05-22 ENCOUNTER — Encounter: Payer: Self-pay | Admitting: Family Medicine

## 2021-05-22 ENCOUNTER — Ambulatory Visit (INDEPENDENT_AMBULATORY_CARE_PROVIDER_SITE_OTHER): Payer: Medicaid Other | Admitting: Family Medicine

## 2021-05-22 VITALS — BP 118/64 | HR 87 | Ht 61.0 in | Wt 121.4 lb

## 2021-05-22 DIAGNOSIS — M222X9 Patellofemoral disorders, unspecified knee: Secondary | ICD-10-CM | POA: Insufficient documentation

## 2021-05-22 MED ORDER — CELECOXIB 100 MG PO CAPS: 100.0000 mg | ORAL_CAPSULE | Freq: Two times a day (BID) | ORAL | 0 refills | Status: DC | PRN

## 2021-05-22 NOTE — Assessment & Plan Note (Signed)
Patient with chronic anterior left knee pain, onset roughly 2 years prior while descending the stairs holding her young child.  Has had evaluation by outside orthopedic group involving MRI, corticosteroid injection, and medications, all with limited response.  Pain currently with stairs, bending/straightening, prolonged ambulation.  Examination shows dynamic maltracking, tenderness to the MPFL, patellar facet, patellar tendon, no laxity anterior/posteriorly or with valgus/varus stressing, negative McMurray's, and negative Lachman's.  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  Plan for Celebrex as needed, formal physical therapy, patellar stabilizing brace.  She will return for follow-up in 6 weeks, for suboptimal progress can consider corticosteroid injection, advanced imaging, modification of pharmacotherapy. ? ?Chronic condition, symptomatic, Rx management ?

## 2021-05-22 NOTE — Patient Instructions (Addendum)
-   Dose Celebrex twice daily on as-needed basis ?- Start physical therapy ?- Can use knee brace while symptomatic ?- Return for follow-up in 6 weeks ?

## 2021-05-22 NOTE — Progress Notes (Signed)
With ?  ? ?Primary Care / Sports Medicine Office Visit ? ?Patient Information:  ?Patient ID: Jeanette Miranda, female DOB: April 19, 1990 Age: 31 y.o. MRN: [id number]  ? ?AKAIYA TOUCHETTE is a pleasant 31 y.o. female presenting with the following: ? ?Chief Complaint  ?Patient presents with  ? Knee Pain  ?  Left, weather and walking makes it worst. States that it isn't as bad as last time.   ? ? ?Vitals:  ? 05/22/21 1053  ?BP: 118/64  ?Pulse: 87  ?SpO2: 94%  ? ?Vitals:  ? 05/22/21 1053  ?Weight: 121 lb 6.4 oz (55.1 kg)  ?Height: '5\' 1"'$  (1.549 m)  ? ?Body mass index is 22.94 kg/m?. ? ?No results found.  ? ?Independent interpretation of notes and tests performed by another provider:  ? ?None ? ?Procedures performed:  ? ?None ? ?Pertinent History, Exam, Impression, and Recommendations:  ? ?Problem List Items Addressed This Visit   ? ?  ? Musculoskeletal and Integument  ? Patellofemoral maltracking - Primary  ?  Patient with chronic anterior left knee pain, onset roughly 2 years prior while descending the stairs holding her young child.  Has had evaluation by outside orthopedic group involving MRI, corticosteroid injection, and medications, all with limited response.  Pain currently with stairs, bending/straightening, prolonged ambulation.  Examination shows dynamic maltracking, tenderness to the MPFL, patellar facet, patellar tendon, no laxity anterior/posteriorly or with valgus/varus stressing, negative McMurray's, and negative Lachman's.  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  Plan for Celebrex as needed, formal physical therapy, patellar stabilizing brace.  She will return for follow-up in 6 weeks, for suboptimal progress can consider corticosteroid injection, advanced imaging, modification of pharmacotherapy. ? ?Chronic condition, symptomatic, Rx management ? ?  ?  ? Relevant Medications  ? celecoxib (CELEBREX) 100 MG capsule  ?  Other Relevant Orders  ? Ambulatory referral to Physical Therapy  ?  ? ?Orders & Medications ?Meds ordered this encounter  ?Medications  ? celecoxib (CELEBREX) 100 MG capsule  ?  Sig: Take 1 capsule (100 mg total) by mouth 2 (two) times daily as needed.  ?  Dispense:  60 capsule  ?  Refill:  0  ? ?Orders Placed This Encounter  ?Procedures  ? Ambulatory referral to Physical Therapy  ?  ? ?Return in about 6 weeks (around 07/03/2021).  ?  ? ?Montel Culver, MD ? ? Primary Care Sports Medicine ?[facility] ?[place]  ? ?

## 2021-06-14 ENCOUNTER — Ambulatory Visit (INDEPENDENT_AMBULATORY_CARE_PROVIDER_SITE_OTHER): Payer: Medicaid Other | Admitting: Family Medicine

## 2021-06-14 ENCOUNTER — Encounter: Payer: Self-pay | Admitting: Family Medicine

## 2021-06-14 VITALS — BP 126/86 | HR 78 | Temp 99.0°F | Ht 61.0 in | Wt 120.0 lb

## 2021-06-14 DIAGNOSIS — J029 Acute pharyngitis, unspecified: Secondary | ICD-10-CM

## 2021-06-14 MED ORDER — PROMETHAZINE-DM 6.25-15 MG/5ML PO SYRP: 5.0000 mL | ORAL_SOLUTION | Freq: Four times a day (QID) | ORAL | 0 refills | Status: DC | PRN

## 2021-06-14 MED ORDER — AZITHROMYCIN 250 MG PO TABS: ORAL_TABLET | ORAL | 0 refills | Status: AC

## 2021-06-14 NOTE — Progress Notes (Signed)
     Primary Care / Sports Medicine Office Visit  Patient Information:  Patient ID: Jeanette Miranda, female DOB: 07/26/1990 Age: 30 y.o. MRN: [id number]   Jeanette Miranda is a pleasant 31 y.o. female presenting with the following:  Chief Complaint  Patient presents with   Conjunctivitis     woke up this morning left eye was completely glued shut, kids had pink eye, can barely talk     Vitals:   06/14/21 1000  BP: 126/86  Pulse: 78  Temp: 99 F (37.2 C)  SpO2: 98%   Vitals:   06/14/21 1000  Weight: 120 lb (54.4 kg)  Height: '5\' 1"'$  (1.549 m)   Body mass index is 22.67 kg/m.  No results found.   Independent interpretation of notes and tests performed by another provider:   None  Procedures performed:   None  Pertinent History, Exam, Impression, and Recommendations:   Problem List Items Addressed This Visit       Respiratory   Acute pharyngitis - Primary    Several day history of throat, ear pain, congestion, cough, and 1 day history of being sealed shut with secretions.  Does have sick contacts with similar symptoms (trauma).  Has trialed OTC medications with limited response, denies any fevers or chills.  Examination consistent with temperature 99, ill-appearing, hoarse voice, pharyngeal erythema without exudate, nasal turbinate erythema with mild swelling, tympanic membranes dull and canals benign, left cervical lymphadenopathy, cardiac findings benign, lung fields are clear to auscultation bilaterally without wheezes, rales, rhonchi.  Left eye with scant discharge, no swelling, conjunctive a benign.  Constellation of findings raise concern for pharyngitis, given the duration of symptoms, will treat for concern over bacterial component, adjunct Rx antitussive, and continue supportive care.       Relevant Medications   azithromycin (ZITHROMAX) 250 MG tablet   promethazine-dextromethorphan (PROMETHAZINE-DM) 6.25-15 MG/5ML syrup     Orders & Medications Meds  ordered this encounter  Medications   azithromycin (ZITHROMAX) 250 MG tablet    Sig: Take 2 tablets on day 1, then 1 tablet daily on days 2 through 5    Dispense:  6 tablet    Refill:  0   promethazine-dextromethorphan (PROMETHAZINE-DM) 6.25-15 MG/5ML syrup    Sig: Take 5 mLs by mouth 4 (four) times daily as needed for cough.    Dispense:  118 mL    Refill:  0   No orders of the defined types were placed in this encounter.    No follow-ups on file.     Montel Culver, MD   Primary Care Sports Medicine [place]

## 2021-06-14 NOTE — Telephone Encounter (Signed)
Please review.  KP

## 2021-06-14 NOTE — Assessment & Plan Note (Signed)
Several day history of throat, ear pain, congestion, cough, and 1 day history of being sealed shut with secretions.  Does have sick contacts with similar symptoms (trauma).  Has trialed OTC medications with limited response, denies any fevers or chills.  Examination consistent with temperature 99, ill-appearing, hoarse voice, pharyngeal erythema without exudate, nasal turbinate erythema with mild swelling, tympanic membranes dull and canals benign, left cervical lymphadenopathy, cardiac findings benign, lung fields are clear to auscultation bilaterally without wheezes, rales, rhonchi.  Left eye with scant discharge, no swelling, conjunctive a benign.  Constellation of findings raise concern for pharyngitis, given the duration of symptoms, will treat for concern over bacterial component, adjunct Rx antitussive, and continue supportive care.

## 2021-06-19 ENCOUNTER — Ambulatory Visit: Payer: Medicaid Other | Admitting: Physical Therapy

## 2021-06-25 ENCOUNTER — Encounter: Payer: Self-pay | Admitting: Family Medicine

## 2021-06-26 ENCOUNTER — Encounter: Payer: Self-pay | Admitting: Family Medicine

## 2021-06-26 ENCOUNTER — Other Ambulatory Visit: Payer: Self-pay | Admitting: Family Medicine

## 2021-06-26 ENCOUNTER — Ambulatory Visit: Payer: Medicaid Other | Attending: Family Medicine | Admitting: Physical Therapy

## 2021-06-26 ENCOUNTER — Ambulatory Visit (INDEPENDENT_AMBULATORY_CARE_PROVIDER_SITE_OTHER): Payer: Medicaid Other | Admitting: Family Medicine

## 2021-06-26 VITALS — BP 110/70 | HR 78 | Temp 98.9°F | Ht 61.0 in | Wt 124.0 lb

## 2021-06-26 DIAGNOSIS — R1032 Left lower quadrant pain: Secondary | ICD-10-CM | POA: Diagnosis not present

## 2021-06-26 DIAGNOSIS — M222X9 Patellofemoral disorders, unspecified knee: Secondary | ICD-10-CM | POA: Insufficient documentation

## 2021-06-26 DIAGNOSIS — G8929 Other chronic pain: Secondary | ICD-10-CM | POA: Diagnosis present

## 2021-06-26 DIAGNOSIS — M6281 Muscle weakness (generalized): Secondary | ICD-10-CM | POA: Diagnosis present

## 2021-06-26 DIAGNOSIS — M25562 Pain in left knee: Secondary | ICD-10-CM | POA: Insufficient documentation

## 2021-06-26 DIAGNOSIS — J309 Allergic rhinitis, unspecified: Secondary | ICD-10-CM

## 2021-06-26 LAB — POCT URINALYSIS DIPSTICK
Bilirubin, UA: NEGATIVE
Blood, UA: NEGATIVE
Glucose, UA: NEGATIVE
Ketones, UA: NEGATIVE
Leukocytes, UA: NEGATIVE
Nitrite, UA: NEGATIVE
Protein, UA: NEGATIVE
Spec Grav, UA: 1.015 (ref 1.010–1.025)
Urobilinogen, UA: 0.2 E.U./dL
pH, UA: 6.5 (ref 5.0–8.0)

## 2021-06-26 NOTE — Assessment & Plan Note (Signed)
Patient has noted improvement in previous symptoms, has now experienced bilateral eye irritation, different than initial, congestion, and improving cough.  Findings and presentation most consistent with residual allergic component, I have advised scheduled Flonase x7 days.  She will be returning in 7 days for reevaluation, can assess response at that time.

## 2021-06-26 NOTE — Progress Notes (Signed)
     Primary Care / Sports Medicine Office Visit  Patient Information:  Patient ID: Jeanette Miranda, female DOB: [birth year] Age: 31 y.o. MRN: [id number]   Jeanette Miranda is a pleasant 31 y.o. female presenting with the following:  Chief Complaint  Patient presents with   Flank Pain    Both sides for 2 days. No fevers. Did have some yellow thick discharge, happened Friday and Saturday  but not since.     Vitals:   06/26/21 1326  BP: 110/70  Pulse: 78  Temp: 98.9 F (37.2 C)  SpO2: 99%   Vitals:   06/26/21 1326  Weight: 124 lb (56.2 kg)  Height: '5\' 1"'$  (1.549 m)   Body mass index is 23.43 kg/m.  No results found.   Independent interpretation of notes and tests performed by another provider:   None  Procedures performed:   None  Pertinent History, Exam, Impression, and Recommendations:   Problem List Items Addressed This Visit       Respiratory   Allergic rhinitis    Patient has noted improvement in previous symptoms, has now experienced bilateral eye irritation, different than initial, congestion, and improving cough.  Findings and presentation most consistent with residual allergic component, I have advised scheduled Flonase x7 days.  She will be returning in 7 days for reevaluation, can assess response at that time.         Other   Left lower quadrant abdominal pain - Primary    Noted over the past few days in the setting of recent antibiotics and NSAID usage.  Has had constant cramping type pain throughout the lower abdomen, left greater than right, denies any significant radiation, has had 2 days of scant yellowish and bloody discharge, painless, increased urinary frequency without other urinary change.  No bowel changes endorsed, no nausea, no fever, no chills, no anorexia.  She describes to being amenorrheic since having IUD in place.  Examination reveals reassuring vitals, hypoactive bowel sounds, no abdominal distention, nontender to palpation  throughout the abdominal quadrants, left inguinal and right inguinal pelvic regions, no CVA tenderness, nontender about the lumbosacral spine.  Urine studies reassuring.  Concern for possible constipation related symptoms, bowel regimen discussed, cannot exclude UTI, will send urine for culture, renal stones can be considered for recalcitrant symptoms.  She is to initiate probiotic, increase hydration, make appropriate dietary changes, and she will be returning in 7 days for routine follow-up at which point we can reassess her symptoms.        Relevant Orders   POCT urinalysis dipstick (Completed)   Urine Culture     Orders & Medications No orders of the defined types were placed in this encounter.  Orders Placed This Encounter  Procedures   Urine Culture   POCT urinalysis dipstick     No follow-ups on file.     Montel Culver, MD   Primary Care Sports Medicine [place]

## 2021-06-26 NOTE — Assessment & Plan Note (Signed)
Noted over the past few days in the setting of recent antibiotics and NSAID usage.  Has had constant cramping type pain throughout the lower abdomen, left greater than right, denies any significant radiation, has had 2 days of scant yellowish and bloody discharge, painless, increased urinary frequency without other urinary change.  No bowel changes endorsed, no nausea, no fever, no chills, no anorexia.  She describes to being amenorrheic since having IUD in place.  Examination reveals reassuring vitals, hypoactive bowel sounds, no abdominal distention, nontender to palpation throughout the abdominal quadrants, left inguinal and right inguinal pelvic regions, no CVA tenderness, nontender about the lumbosacral spine.  Urine studies reassuring.  Concern for possible constipation related symptoms, bowel regimen discussed, cannot exclude UTI, will send urine for culture, renal stones can be considered for recalcitrant symptoms.  She is to initiate probiotic, increase hydration, make appropriate dietary changes, and she will be returning in 7 days for routine follow-up at which point we can reassess her symptoms.

## 2021-06-26 NOTE — Therapy (Signed)
OUTPATIENT PHYSICAL THERAPY LOWER EXTREMITY EVALUATION   Patient Name: Jeanette Miranda MRN: [id number] DOB:[birth year], 31 y.o., female Today's Date: 06/27/2021   PT End of Session - 06/27/21 1324     Visit Number 1    Number of Visits 7    Date for PT Re-Evaluation 08/07/21    Progress Note Due on Visit 10    PT Start Time 1114    PT Stop Time 1200    PT Time Calculation (min) 46 min    Activity Tolerance Patient tolerated treatment well    Behavior During Therapy [facility] for tasks assessed/performed             Past Medical History:  Diagnosis Date   Allergy    Asthma    GERD (gastroesophageal reflux disease)    Hyperemesis 11/14/2018   UTI (urinary tract infection)    Past Surgical History:  Procedure Laterality Date   BREAST BIOPSY Right 2019   benign and currently asymptomatic   TONSILLECTOMY AND ADENOIDECTOMY  2012   WISDOM TOOTH EXTRACTION Bilateral 2011   Patient Active Problem List   Diagnosis Date Noted   Allergic rhinitis 06/26/2021   Patellofemoral maltracking 05/22/2021   Annual physical exam 04/30/2021   Acneiform rash 04/30/2021   GERD (gastroesophageal reflux disease)    Asthma    Contraceptive, surveillance, intrauterine device 01/31/2020   Acute pain of left knee 10/13/2019   Encounter for medical examination to establish care 10/13/2019   Indication for care in labor or delivery 06/10/2019   Preterm delivery, delivered 06/10/2019   Hematuria 05/23/2019   Threatened preterm labor, third trimester 05/17/2019   Vaginal bleeding in pregnancy, third trimester 05/16/2019   Supervision of low-risk pregnancy 11/13/2018   Anxiety and depression 11/13/2018   Endometriosis 08/26/2018   Left lower quadrant abdominal pain 12/18/2017   Mastalgia 04/01/2017   Breast mass in female 03/07/2017    PCP: Rosette Reveal, MD  REFERRING PROVIDER: Rosette Reveal, MD  REFERRING DIAG: M22.2X9 (ICD-10-CM) - Patellofemoral maltracking  THERAPY DIAG:   Chronic pain of left knee  Muscle weakness (generalized)  Patellofemoral maltracking  Rationale for Evaluation and Treatment Rehabilitation  ONSET DATE: 2021  SUBJECTIVE:   SUBJECTIVE STATEMENT: Patient presents to PT with anterior and medial left knee pain with referral for patellofemoral maltracking.     PERTINENT HISTORY: Patient presents to PT with anterior and medial left knee pain with referral for patellofemoral maltracking. Onset occurred about 2 years ago when pt was descending stairs while carrying her child. She slipped on a step and states she caught herself to prevent falling however twisted her knee. She has received a cortisone injection for this pain; had some relief however pain has returned. Pain seems to be affected by activity level and weather. Aggravating factors include walking for 30-40 minutes, prolonged standing, squatting and stairs. Occasionally sleep is interrupted due to pain. Easing factors include rest and Tylenol only taken if pt is unable to fall asleep. Pain ranges from a 0/10 to an 8/10. Pt is currently wearing a knee brace that was given to her by Dr. Zigmund Daniel office; she reports knee feeling more stable with the brace on and a slight reduction in pain. Pt reports also having left hip pain that has been present for 20 years; at 31 years old, pt was placed on bedrest and using a w/c. She states never receiving a formal diagnosis regarding her hip. States her father has same symptoms as she does.   PAIN:  Are  you having pain? Yes: NPRS scale: 3/10 Pain location: anterior, medial left knee Pain description: sharp, dull, achy  Aggravating factors: walking for 30-40 minutes, prolonged standing, squatting and stairs Relieving factors: rest and Tylenol  PRECAUTIONS: None  WEIGHT BEARING RESTRICTIONS No  FALLS:  Has patient fallen in last 6 months? No  LIVING ENVIRONMENT: Lives with: lives with their family Lives in: House/apartment Stairs: Yes:  External: 4 steps; on right going up Has following equipment at home: None  OCCUPATION: unemployed  HOBBIES: art, movies, music; has 3 young children   PLOF: Independent  PATIENT GOALS: decrease pain    OBJECTIVE:   DIAGNOSTIC FINDINGS: MRI 2022 - "mild non-specific infrapatellar fat pad edema. No internal derangement is otherwise identified."  PATIENT SURVEYS:  LEFS 58/80 = 72.5% FOTO 67/72  COGNITION:  Overall cognitive status: Within functional limits for tasks assessed     SENSATION: WFL  MUSCLE LENGTH: Hamstrings: lacking 25 degrees bilateral  Ely's test: mild limitations bilateral, L>R  POSTURE: rounded shoulders and forward head  PALPATION: Pain throughout general medial knee region - increased over pes anserine, MCL and medial joint line. No pain noted directly over patella or patellar tendon.   LOWER EXTREMITY ROM:  Active ROM Right eval Left eval  Hip flexion 115 110  Hip extension 20 16  Hip abduction 40 40  Hip adduction    Hip internal rotation    Hip external rotation    Knee flexion 140 140  Knee extension 0 0  Ankle dorsiflexion 15 15  Ankle plantarflexion    Ankle inversion    Ankle eversion     (Blank rows = not tested)  LOWER EXTREMITY MMT:  MMT Right eval Left eval  Hip flexion 4 4-*  Hip extension 5 4  Hip abduction 4 4-  Hip adduction    Hip internal rotation    Hip external rotation    Knee flexion 4 4+  Knee extension 5 4+  Ankle dorsiflexion 5 5  Ankle plantarflexion    Ankle inversion    Ankle eversion     (Blank rows = not tested)  LOWER EXTREMITY SPECIAL TESTS:  Knee special tests: Patellafemoral apprehension test: negative, Lateral pull sign: negative, and Patellafemoral grind test: positive   PATELLAR MOBILITY:   LLE hypermobile patella. Compared to RLE, left patella rests medial and superiorly.    LLE patella tracks medially with knee extension.  FUNCTIONAL TESTS:  Squat: weight shift to right, narrow  stance, bilateral ankle inversion, forward and medial tracking of knees L>R, bilateral heel lift STS: decreased eccentric control, weight shift to right, pain Stairs: reciprocal pattern with no UE support, good control, no pain.   GAIT: Comments: decreased rotation at pelvis, forward posture of head and shoulders with minimal arm swing; left hip ER leading to toe out, occasional decreased foot clearance RLE.    TODAY'S TREATMENT: Initiated HEP:  Access Code [email or web address]  Exercises - Seated Hamstring Stretch, 60 sec hold BLE - Standing Quadriceps Stretch, 60 sec hold BLE - Hip Flexor Stretch with Chair, 60 sec hold LLE - Supine Active Straight Leg Raise, 10 reps BLE     PATIENT EDUCATION:  Education details: POC, HEP  Person educated: Patient Education method: Consulting civil engineer, Demonstration, and Handouts Education comprehension: verbalized understanding   HOME EXERCISE PROGRAM: Access Code: [email or web address]     ASSESSMENT:  CLINICAL IMPRESSION: Patient is a 31 y.o. female who was seen today for physical therapy evaluation and treatment of anterior and medial left knee pain  with referral to PT for patellofemoral maltracking. Patient presents with LE strength deficits, left > right, in hip and knee. General AROM appears WFL; flexibility of hamstrings and hip flexors are limited bilaterally, L hip flexor > R. Left patella appears to be hypermobile with patella resting superior and medial compared to right patella; left patella also tracks medially during knee extension. Possible over-activation of vastus medialis or under-activation of vastus lateralis. Pt demonstrates poor body mechanics during squat and altered gait mechanics. Pt would benefit from skilled PT to address deficits in strength, flexibility and train overall body mechanics to decrease current pain, reduce risk of future pain/injury and improve pt ability to participate in day trips with her family that include prolonged standing or  walking.    OBJECTIVE IMPAIRMENTS Abnormal gait, decreased activity tolerance, decreased strength, postural dysfunction, and pain.   ACTIVITY LIMITATIONS lifting, bending, sitting, standing, squatting, sleeping, stairs, and locomotion level  PARTICIPATION LIMITATIONS: meal prep, cleaning, shopping, community activity, and yard work  PERSONAL FACTORS Time since onset of injury/illness/exacerbation are also affecting patient's functional outcome.   REHAB POTENTIAL: Good  CLINICAL DECISION MAKING: Stable/uncomplicated  EVALUATION COMPLEXITY: Low   GOALS: Goals reviewed with patient? Yes  SHORT TERM GOALS: Target date: 07/11/2021   Patient will be independent in home exercise program to improve strength/mobility for better functional independence with ADLs. Baseline: Goal status: INITIAL   LONG TERM GOALS: Target date: 08/08/2021   Patient will increase FOTO score to equal to or greater than 72 to demonstrate statistically significant improvement in mobility and quality of life. Baseline: 06/26/21: 67/72 Goal status: INITIAL  2.  Pt will increase strength of right and left hip and knee musculature by at least 1/2 MMT grade in order to demonstrate improvement in strength and function Baseline: 06/26/21: ranging from 4- to 5 (please see table in objective) Goal status: INITIAL  3.  Pt will decrease worst pain as reported on NPRS by at least 3 points in order to demonstrate clinically significant reduction in knee pain.  Baseline: 06/26/21: 8/10 Goal status: INITIAL  4.  Pt will report ability to walk for greater than 1 hour in order to better participate in family outings and activities with her kids.  Baseline: 06/26/21: 30-40 minutes Goal status: INITIAL  5.  Pt will demonstrate sound technique while performing a squat to decrease risk of future injury and prevent continuation of knee pain present at evaluation due to poor mechanics while squatting down to pick up her 30 year old.   Baseline: 06/26/21: see objective for squatting description  Goal status: INITIAL     PLAN: PT FREQUENCY: 1-2x/week  PT DURATION: 6 weeks  PLANNED INTERVENTIONS: Therapeutic exercises, Therapeutic activity, Neuromuscular re-education, Balance training, Gait training, Patient/Family education, Joint mobilization, Stair training, Dry Needling, and Manual therapy  PLAN FOR NEXT SESSION: Hip strengthening, quad strengthening for patellar stability; train correct body mechanics with functional tasks such as squatting; manual techniques as needed.     Patrina Levering PT, DPT

## 2021-06-26 NOTE — Patient Instructions (Addendum)
-   Reviewed information provided - Start OTC probiotic - Start Flonase (fluticasone propionate) x7 days - Limit use of Celebrex - We will contact you with urine culture results once available - Return for follow-up as scheduled

## 2021-06-26 NOTE — Telephone Encounter (Signed)
Please advise 

## 2021-06-27 ENCOUNTER — Encounter: Payer: Self-pay | Admitting: Physical Therapy

## 2021-06-28 LAB — SPECIMEN STATUS REPORT

## 2021-06-28 LAB — URINE CULTURE

## 2021-07-02 ENCOUNTER — Ambulatory Visit: Payer: Medicaid Other | Admitting: Physical Therapy

## 2021-07-03 ENCOUNTER — Ambulatory Visit (INDEPENDENT_AMBULATORY_CARE_PROVIDER_SITE_OTHER): Payer: Medicaid Other | Admitting: Family Medicine

## 2021-07-03 ENCOUNTER — Encounter: Payer: Self-pay | Admitting: Family Medicine

## 2021-07-03 DIAGNOSIS — R1032 Left lower quadrant pain: Secondary | ICD-10-CM

## 2021-07-03 DIAGNOSIS — M222X9 Patellofemoral disorders, unspecified knee: Secondary | ICD-10-CM | POA: Diagnosis not present

## 2021-07-03 NOTE — Assessment & Plan Note (Signed)
Patient presents for follow-up to chronic left patellofemoral related knee pain.  Since last visit she has had persistent symptoms, somewhat mitigated by constant patellar stabilizing brace usage.  She has not been able to attend 1 PT session, did have spike in pain afterwards which resolved within a few days to baseline.  Examination today shows tenderness about the medial patellar facet and medial joint line anteriorly, flexion to 120 degrees without pain, beyond this can achieve full range of motion though with increasing pain localizing anteriorly, no laxity on exam.  Given that she has just charted physical therapy, I encouraged her to finish out PT course, wean from knee brace as symptoms allow, we discussed appropriate medication administration prior to PT and home exercises to control pain, and status update after PT course completed to determine next steps.  If still symptomatic, advanced imaging to be considered.  Chronic issue, symptomatic, Rx management

## 2021-07-03 NOTE — Assessment & Plan Note (Signed)
This issue has since resolved after initiating probiotics and increasing water intake.

## 2021-07-03 NOTE — Progress Notes (Signed)
     Primary Care / Sports Medicine Office Visit  Patient Information:  Patient ID: Jeanette Miranda, female DOB: 09-09-1990 Age: 31 y.o. MRN: [id number]   Jeanette Miranda is a pleasant 31 y.o. female presenting with the following:  Chief Complaint  Patient presents with   Knee Pain    Pt here to F/U with left knee, feels good as long as she is wearing the brace.     Vitals:   07/03/21 1026  BP: 118/78  Pulse: 86  SpO2: 98%   Vitals:   07/03/21 1026  Weight: 122 lb 9.6 oz (55.6 kg)  Height: '5\' 1"'$  (1.549 m)   Body mass index is 23.17 kg/m.  No results found.   Independent interpretation of notes and tests performed by another provider:   None  Procedures performed:   None  Pertinent History, Exam, Impression, and Recommendations:   Problem List Items Addressed This Visit       Musculoskeletal and Integument   Patellofemoral maltracking    Patient presents for follow-up to chronic left patellofemoral related knee pain.  Since last visit she has had persistent symptoms, somewhat mitigated by constant patellar stabilizing brace usage.  She has not been able to attend 1 PT session, did have spike in pain afterwards which resolved within a few days to baseline.  Examination today shows tenderness about the medial patellar facet and medial joint line anteriorly, flexion to 120 degrees without pain, beyond this can achieve full range of motion though with increasing pain localizing anteriorly, no laxity on exam.  Given that she has just charted physical therapy, I encouraged her to finish out PT course, wean from knee brace as symptoms allow, we discussed appropriate medication administration prior to PT and home exercises to control pain, and status update after PT course completed to determine next steps.  If still symptomatic, advanced imaging to be considered.  Chronic issue, symptomatic, Rx management         Other   Left lower quadrant abdominal pain    This  issue has since resolved after initiating probiotics and increasing water intake.         Orders & Medications No orders of the defined types were placed in this encounter.  No orders of the defined types were placed in this encounter.    Return if symptoms worsen or fail to improve.     Montel Culver, MD   [facility] [place]

## 2021-07-03 NOTE — Patient Instructions (Signed)
-   Continue with physical therapy as currently scheduled and perform regular home exercises - Recommend dosing Celebrex with breakfast on the day of your PT sessions - Second, p.m. dose can be taken if necessary - Additionally, dose Celebrex on as-needed basis for pain noted during home exercises - Can wean and discontinue brace as knee symptoms improve - Provide a status update via MyChart message at the end of physical therapy to discuss next steps next - Contact us for any questions between now and then

## 2021-07-04 ENCOUNTER — Ambulatory Visit: Payer: Medicaid Other | Attending: Family Medicine | Admitting: Physical Therapy

## 2021-07-04 DIAGNOSIS — G8929 Other chronic pain: Secondary | ICD-10-CM | POA: Insufficient documentation

## 2021-07-04 DIAGNOSIS — M6281 Muscle weakness (generalized): Secondary | ICD-10-CM | POA: Diagnosis present

## 2021-07-04 DIAGNOSIS — M222X9 Patellofemoral disorders, unspecified knee: Secondary | ICD-10-CM | POA: Insufficient documentation

## 2021-07-04 DIAGNOSIS — M25562 Pain in left knee: Secondary | ICD-10-CM | POA: Diagnosis present

## 2021-07-04 NOTE — Therapy (Signed)
OUTPATIENT PHYSICAL THERAPY TREATMENT NOTE   Patient Name: Jeanette Miranda MRN: [id number] DOB:11/24/1990, 31 y.o., female Today's Date: 07/06/2021  PCP: Montel Culver, MD REFERRING PROVIDER: Montel Culver, MD   END OF SESSION:   PT End of Session - 07/06/21 0900     Visit Number 2    Number of Visits 7    Date for PT Re-Evaluation 08/07/21    Progress Note Due on Visit 10    PT Start Time 6195    PT Stop Time 0932    PT Time Calculation (min) 43 min    Activity Tolerance Patient tolerated treatment well    Behavior During Therapy [facility] for tasks assessed/performed              Past Medical History:  Diagnosis Date   Allergy    Asthma    GERD (gastroesophageal reflux disease)    Hyperemesis 11/14/2018   UTI (urinary tract infection)    Past Surgical History:  Procedure Laterality Date   BREAST BIOPSY Right 2019   benign and currently asymptomatic   TONSILLECTOMY AND ADENOIDECTOMY  2012   WISDOM TOOTH EXTRACTION Bilateral 2011   Patient Active Problem List   Diagnosis Date Noted   Allergic rhinitis 06/26/2021   Patellofemoral maltracking 05/22/2021   Annual physical exam 04/30/2021   Acneiform rash 04/30/2021   GERD (gastroesophageal reflux disease)    Asthma    Contraceptive, surveillance, intrauterine device 01/31/2020   Acute pain of left knee 10/13/2019   Encounter for medical examination to establish care 10/13/2019   Indication for care in labor or delivery 06/10/2019   Preterm delivery, delivered 06/10/2019   Hematuria 05/23/2019   Threatened preterm labor, third trimester 05/17/2019   Vaginal bleeding in pregnancy, third trimester 05/16/2019   Supervision of low-risk pregnancy 11/13/2018   Anxiety and depression 11/13/2018   Endometriosis 08/26/2018   Left lower quadrant abdominal pain 12/18/2017   Mastalgia 04/01/2017   Breast mass in female 03/07/2017    REFERRING DIAG: M22.2X9 (ICD-10-CM) - Patellofemoral maltracking  THERAPY DIAG:   Chronic pain of left knee  Muscle weakness (generalized)  Patellofemoral maltracking  Rationale for Evaluation and Treatment Rehabilitation  PERTINENT HISTORY: Patient presents to PT with anterior and medial left knee pain with referral for patellofemoral maltracking. Onset occurred about 2 years ago when pt was descending stairs while carrying her child. She slipped on a step and states she caught herself to prevent falling however twisted her knee. She has received a cortisone injection for this pain; had some relief however pain has returned. Pain seems to be affected by activity level and weather. Aggravating factors include walking for 30-40 minutes, prolonged standing, squatting and stairs. Occasionally sleep is interrupted due to pain. Easing factors include rest and Tylenol only taken if pt is unable to fall asleep. Pain ranges from a 0/10 to an 8/10. Pt is currently wearing a knee brace that was given to her by Dr. Zigmund Daniel office; she reports knee feeling more stable with the brace on and a slight reduction in pain. Pt reports also having left hip pain that has been present for 20 years; at 31 years old, pt was placed on bedrest and using a w/c. She states never receiving a formal diagnosis regarding her hip. States her father has same symptoms as she does.   PRECAUTIONS: None  SUBJECTIVE: Pt reports difficulty with initiation of her HEP. Pt has continued use of knee brace; she is donning brace at arrival. Patient  reports having notable pain with exercises requiring significant degree of knee flexion. She reports no pain at arrival today.   PAIN:  Are you having pain? Yes: NPRS scale: 0/10    OBJECTIVE: (objective measures completed at initial evaluation unless otherwise dated)   DIAGNOSTIC FINDINGS: MRI 2022 - "mild non-specific infrapatellar fat pad edema. No internal derangement is otherwise identified."   PATIENT SURVEYS:  LEFS 58/80 = 72.5% FOTO 67/72   COGNITION:            Overall cognitive status: Within functional limits for tasks assessed                          SENSATION: WFL   MUSCLE LENGTH: Hamstrings: lacking 25 degrees bilateral  Ely's test: mild limitations bilateral, L>R   POSTURE: rounded shoulders and forward head   PALPATION: Pain throughout general medial knee region - increased over pes anserine, MCL and medial joint line. No pain noted directly over patella or patellar tendon.    LOWER EXTREMITY ROM:   Active ROM Right eval Left eval  Hip flexion 115 110  Hip extension 20 16  Hip abduction 40 40  Hip adduction      Hip internal rotation      Hip external rotation      Knee flexion 140 140  Knee extension 0 0  Ankle dorsiflexion 15 15  Ankle plantarflexion      Ankle inversion      Ankle eversion       (Blank rows = not tested)   LOWER EXTREMITY MMT:   MMT Right eval Left eval  Hip flexion 4 4-*  Hip extension 5 4  Hip abduction 4 4-  Hip adduction      Hip internal rotation      Hip external rotation      Knee flexion 4 4+  Knee extension 5 4+  Ankle dorsiflexion 5 5  Ankle plantarflexion      Ankle inversion      Ankle eversion       (Blank rows = not tested)   LOWER EXTREMITY SPECIAL TESTS:  Knee special tests: Patellafemoral apprehension test: negative, Lateral pull sign: negative, and Patellafemoral grind test: positive    PATELLAR MOBILITY:            LLE hypermobile patella. Compared to RLE, left patella rests medial and superiorly.             LLE patella tracks medially with knee extension.   FUNCTIONAL TESTS:  Squat: weight shift to right, narrow stance, bilateral ankle inversion, forward and medial tracking of knees L>R, bilateral heel lift STS: decreased eccentric control, weight shift to right, pain Stairs: reciprocal pattern with no UE support, good control, no pain.    GAIT: Comments: decreased rotation at pelvis, forward posture of head and shoulders with minimal arm swing; left hip ER  leading to toe out, occasional decreased foot clearance RLE.       TODAY'S TREATMENT:   Therapeutic Exercise - for improved soft tissue flexibility and extensibility as needed for ROM, improved strength as needed to improve performance of CKC activities/functional movements   Seated elliptical, seat at 1; no resistance, 5 minutes (will use NuStep next visit given aversion to significant degree of knee flexion c exercise) - subjective information gathered intermittently during this time, 2 minutes unbilled  Pt edu: Updated and modified HEP based on tolerance reported following her last PT visit. Discussed  re-visiting more challenging exercises as her condition improves.    Neuromuscular Re-education - for nervous system downregulation, quad activation and exercises to promote LE kinetic chain stability  L Quadriceps set, 10 sec iso; x10, with towel roll behind knee  L Hamstrings set, 10 sec iso; x10 Sidelying Hip abduction; 2x10, bilateral Bridge; 2x8 with 3 sec iso; knee flexion 80 deg, reduced knee flexion to improve tolerance of exercise  Tactile and verbal cueing and demonstration to promote appropriate muscle activation and optimize exercise technique during each exercise performed.           PATIENT EDUCATION:  Education details: HEP update and review Person educated: Patient Education method: Explanation, Demonstration, and Handouts Education comprehension: verbalized understanding     HOME EXERCISE PROGRAM: Access Code: [email or web address]         ASSESSMENT:   CLINICAL IMPRESSION: Patient had difficulty with initiation of HEP and has experienced intermittent flare-ups of knee pain. Modified home program today to more conservative program with use of isometrics to promote knee complex stability to accommodate patient's current activity and exercise tolerance. She tolerates exercises well today and only has fleeting pain with initiation of bridges - pt tolerates modified bridge  (decreased knee flexion, increasing hamstrings demand) well today. Pt would benefit from continued skilled PT to address deficits in strength, flexibility and train overall body mechanics to decrease current pain, reduce risk of future pain/injury and improve pt ability to participate in day trips with her family that include prolonged standing or walking.      OBJECTIVE IMPAIRMENTS Abnormal gait, decreased activity tolerance, decreased strength, postural dysfunction, and pain.    ACTIVITY LIMITATIONS lifting, bending, sitting, standing, squatting, sleeping, stairs, and locomotion level   PARTICIPATION LIMITATIONS: meal prep, cleaning, shopping, community activity, and yard work   PERSONAL FACTORS Time since onset of injury/illness/exacerbation are also affecting patient's functional outcome.    REHAB POTENTIAL: Good   CLINICAL DECISION MAKING: Stable/uncomplicated   EVALUATION COMPLEXITY: Low     GOALS: Goals reviewed with patient? Yes   SHORT TERM GOALS: Target date: 07/11/2021    Patient will be independent in home exercise program to improve strength/mobility for better functional independence with ADLs. Baseline: Goal status: INITIAL     LONG TERM GOALS: Target date: 08/08/2021    Patient will increase FOTO score to equal to or greater than 72 to demonstrate statistically significant improvement in mobility and quality of life. Baseline: 06/26/21: 67/72 Goal status: INITIAL   2.  Pt will increase strength of right and left hip and knee musculature by at least 1/2 MMT grade in order to demonstrate improvement in strength and function Baseline: 06/26/21: ranging from 4- to 5 (please see table in objective) Goal status: INITIAL   3.  Pt will decrease worst pain as reported on NPRS by at least 3 points in order to demonstrate clinically significant reduction in knee pain.  Baseline: 06/26/21: 8/10 Goal status: INITIAL   4.  Pt will report ability to walk for greater than 1 hour  in order to better participate in family outings and activities with her kids.  Baseline: 06/26/21: 30-40 minutes Goal status: INITIAL   5.  Pt will demonstrate sound technique while performing a squat to decrease risk of future injury and prevent continuation of knee pain present at evaluation due to poor mechanics while squatting down to pick up her 30 year old.  Baseline: 06/26/21: see objective for squatting description  Goal status: INITIAL  PLAN: PT FREQUENCY: 1-2x/week   PT DURATION: 6 weeks   PLANNED INTERVENTIONS: Therapeutic exercises, Therapeutic activity, Neuromuscular re-education, Balance training, Gait training, Patient/Family education, Joint mobilization, Stair training, Dry Needling, and Manual therapy   PLAN FOR NEXT SESSION: Hip strengthening, quad strengthening for patellar stability; train correct body mechanics with functional tasks such as squatting; manual techniques as needed.      Valentina Gu, PT, DPT #[id number]  Eilleen Kempf, PT 07/06/2021, 3:48 PM

## 2021-07-06 ENCOUNTER — Encounter: Payer: Self-pay | Admitting: Physical Therapy

## 2021-07-10 ENCOUNTER — Ambulatory Visit: Payer: Medicaid Other | Admitting: Physical Therapy

## 2021-07-10 ENCOUNTER — Encounter: Payer: Self-pay | Admitting: Physical Therapy

## 2021-07-10 DIAGNOSIS — M222X9 Patellofemoral disorders, unspecified knee: Secondary | ICD-10-CM

## 2021-07-10 DIAGNOSIS — M25562 Pain in left knee: Secondary | ICD-10-CM | POA: Diagnosis not present

## 2021-07-10 DIAGNOSIS — G8929 Other chronic pain: Secondary | ICD-10-CM

## 2021-07-10 DIAGNOSIS — M6281 Muscle weakness (generalized): Secondary | ICD-10-CM

## 2021-07-10 NOTE — Therapy (Signed)
OUTPATIENT PHYSICAL THERAPY TREATMENT NOTE   Patient Name: Jeanette Miranda MRN: [id number] DOB:Aug 02, 1990, 31 y.o., female Today's Date: 07/10/2021  PCP: Montel Culver, MD REFERRING PROVIDER: Montel Culver, MD  END OF SESSION:   PT End of Session - 07/10/21 1106     Visit Number 3    Number of Visits 7    Date for PT Re-Evaluation 08/07/21    Progress Note Due on Visit 10    PT Start Time 1112    PT Stop Time 2536    PT Time Calculation (min) 44 min    Activity Tolerance Patient tolerated treatment well    Behavior During Therapy [facility] for tasks assessed/performed             Past Medical History:  Diagnosis Date   Allergy    Asthma    GERD (gastroesophageal reflux disease)    Hyperemesis 11/14/2018   UTI (urinary tract infection)    Past Surgical History:  Procedure Laterality Date   BREAST BIOPSY Right 2019   benign and currently asymptomatic   TONSILLECTOMY AND ADENOIDECTOMY  2012   WISDOM TOOTH EXTRACTION Bilateral 2011   Patient Active Problem List   Diagnosis Date Noted   Allergic rhinitis 06/26/2021   Patellofemoral maltracking 05/22/2021   Annual physical exam 04/30/2021   Acneiform rash 04/30/2021   GERD (gastroesophageal reflux disease)    Asthma    Contraceptive, surveillance, intrauterine device 01/31/2020   Acute pain of left knee 10/13/2019   Encounter for medical examination to establish care 10/13/2019   Indication for care in labor or delivery 06/10/2019   Preterm delivery, delivered 06/10/2019   Hematuria 05/23/2019   Threatened preterm labor, third trimester 05/17/2019   Vaginal bleeding in pregnancy, third trimester 05/16/2019   Supervision of low-risk pregnancy 11/13/2018   Anxiety and depression 11/13/2018   Endometriosis 08/26/2018   Left lower quadrant abdominal pain 12/18/2017   Mastalgia 04/01/2017   Breast mass in female 03/07/2017      REFERRING DIAG: M22.2X9 (ICD-10-CM) - Patellofemoral maltracking   THERAPY  DIAG:  Chronic pain of left knee   Muscle weakness (generalized)   Patellofemoral maltracking   Rationale for Evaluation and Treatment Rehabilitation   PERTINENT HISTORY: Patient presents to PT with anterior and medial left knee pain with referral for patellofemoral maltracking. Onset occurred about 2 years ago when pt was descending stairs while carrying her child. She slipped on a step and states she caught herself to prevent falling however twisted her knee. She has received a cortisone injection for this pain; had some relief however pain has returned. Pain seems to be affected by activity level and weather. Aggravating factors include walking for 30-40 minutes, prolonged standing, squatting and stairs. Occasionally sleep is interrupted due to pain. Easing factors include rest and Tylenol only taken if pt is unable to fall asleep. Pain ranges from a 0/10 to an 8/10. Pt is currently wearing a knee brace that was given to her by Dr. Zigmund Daniel office; she reports knee feeling more stable with the brace on and a slight reduction in pain. Pt reports also having left hip pain that has been present for 20 years; at 31 years old, pt was placed on bedrest and using a w/c. She states never receiving a formal diagnosis regarding her hip. States her father has same symptoms as she does.    PRECAUTIONS: None   SUBJECTIVE: Pt reports feeling fine at arrival to PT. Patient denies issues with modification to her  HEP. Patient reports no pain acutely at arrival to PT.    PAIN:  Are you having pain? Yes: NPRS scale: 0/10       OBJECTIVE: (objective measures completed at initial evaluation unless otherwise dated)   DIAGNOSTIC FINDINGS: MRI 2022 - "mild non-specific infrapatellar fat pad edema. No internal derangement is otherwise identified."   PATIENT SURVEYS:  LEFS 58/80 = 72.5% FOTO 67/72   COGNITION:           Overall cognitive status: Within functional limits for tasks assessed                           SENSATION: WFL   MUSCLE LENGTH: Hamstrings: lacking 25 degrees bilateral  Ely's test: mild limitations bilateral, L>R   POSTURE: rounded shoulders and forward head   PALPATION: Pain throughout general medial knee region - increased over pes anserine, MCL and medial joint line. No pain noted directly over patella or patellar tendon.    LOWER EXTREMITY ROM:   Active ROM Right eval Left eval  Hip flexion 115 110  Hip extension 20 16  Hip abduction 40 40  Hip adduction      Hip internal rotation      Hip external rotation      Knee flexion 140 140  Knee extension 0 0  Ankle dorsiflexion 15 15  Ankle plantarflexion      Ankle inversion      Ankle eversion       (Blank rows = not tested)   LOWER EXTREMITY MMT:   MMT Right eval Left eval  Hip flexion 4 4-*  Hip extension 5 4  Hip abduction 4 4-  Hip adduction      Hip internal rotation      Hip external rotation      Knee flexion 4 4+  Knee extension 5 4+  Ankle dorsiflexion 5 5  Ankle plantarflexion      Ankle inversion      Ankle eversion       (Blank rows = not tested)   LOWER EXTREMITY SPECIAL TESTS:  Knee special tests: Patellafemoral apprehension test: negative, Lateral pull sign: negative, and Patellafemoral grind test: positive    PATELLAR MOBILITY:            LLE hypermobile patella. Compared to RLE, left patella rests medial and superiorly.             LLE patella tracks medially with knee extension.   FUNCTIONAL TESTS:  Squat: weight shift to right, narrow stance, bilateral ankle inversion, forward and medial tracking of knees L>R, bilateral heel lift STS: decreased eccentric control, weight shift to right, pain Stairs: reciprocal pattern with no UE support, good control, no pain.    GAIT: Comments: decreased rotation at pelvis, forward posture of head and shoulders with minimal arm swing; left hip ER leading to toe out, occasional decreased foot clearance RLE.       TODAY'S TREATMENT:                Therapeutic Exercise - for improved soft tissue flexibility and extensibility as needed for ROM, improved strength as needed to improve performance of CKC activities/functional movements               NuStep, resistance level 1, seat at 5 for active warm-up to improve soft tissue extensibility and muscle performance; subjective information gathered intermittently during this time - 2 minutes unbilled    Multi-angle  quad isometrics; 30 and 60 deg; 1x10, 10 sec in either position, PT providing resistance   Total Gym, Level 20; double-limb; minisquats, 2x10    Neuromuscular Re-education - for nervous system downregulation, quad activation and exercises to promote LE kinetic chain stability   L Quadriceps set, 10 sec iso; x10, with towel roll behind knee  L Hamstrings set, 10 sec iso; reviewed Sidelying Hip abduction; 2x10, bilateral 3-way Hip, Red Tband; x6 ea dir Sidestep in // bars with resistance band around knees, Green Tband; 5x D/B Bridge; 2x8 with 3 sec iso; knee flexion 80 deg, reduced knee flexion to improve tolerance of exercise      Tactile and verbal cueing and demonstration to promote appropriate muscle activation and optimize exercise technique during each exercise performed.        PATIENT EDUCATION:  Education details: HEP update and review Person educated: Patient Education method: Explanation, Demonstration, and Handouts Education comprehension: verbalized understanding     HOME EXERCISE PROGRAM: Access Code: [email or web address]         ASSESSMENT:   CLINICAL IMPRESSION: Patient tolerates quad isometrics at 30 and 60 deg knee flexion well without significant increase in pain, mild "pulling" near region of medial patellar facet and distal vastus medialis following completion of 60-deg isometrics. She is able to significantly progress standing drills with minimal closed-chain knee flexion today - patient only performed closed-chain knee flexion (minisquat 0-30deg)  with use of Total Gym with good tolerance. Patient does not have significant increase in pain acutely in session today. Pt would benefit from continued skilled PT to address deficits in strength, flexibility and train overall body mechanics to decrease current pain, reduce risk of future pain/injury and improve pt ability to participate in day trips with her family that include prolonged standing or walking.      OBJECTIVE IMPAIRMENTS Abnormal gait, decreased activity tolerance, decreased strength, postural dysfunction, and pain.    ACTIVITY LIMITATIONS lifting, bending, sitting, standing, squatting, sleeping, stairs, and locomotion level   PARTICIPATION LIMITATIONS: meal prep, cleaning, shopping, community activity, and yard work   PERSONAL FACTORS Time since onset of injury/illness/exacerbation are also affecting patient's functional outcome.    REHAB POTENTIAL: Good   CLINICAL DECISION MAKING: Stable/uncomplicated   EVALUATION COMPLEXITY: Low     GOALS: Goals reviewed with patient? Yes   SHORT TERM GOALS: Target date: 07/11/2021    Patient will be independent in home exercise program to improve strength/mobility for better functional independence with ADLs. Baseline: Goal status: INITIAL     LONG TERM GOALS: Target date: 08/08/2021    Patient will increase FOTO score to equal to or greater than 72 to demonstrate statistically significant improvement in mobility and quality of life. Baseline: 06/26/21: 67/72 Goal status: INITIAL   2.  Pt will increase strength of right and left hip and knee musculature by at least 1/2 MMT grade in order to demonstrate improvement in strength and function Baseline: 06/26/21: ranging from 4- to 5 (please see table in objective) Goal status: INITIAL   3.  Pt will decrease worst pain as reported on NPRS by at least 3 points in order to demonstrate clinically significant reduction in knee pain.  Baseline: 06/26/21: 8/10 Goal status: INITIAL   4.  Pt  will report ability to walk for greater than 1 hour in order to better participate in family outings and activities with her kids.  Baseline: 06/26/21: 30-40 minutes Goal status: INITIAL   5.  Pt will demonstrate sound technique while performing a  squat to decrease risk of future injury and prevent continuation of knee pain present at evaluation due to poor mechanics while squatting down to pick up her 30 year old.  Baseline: 06/26/21: see objective for squatting description  Goal status: INITIAL         PLAN: PT FREQUENCY: 1-2x/week   PT DURATION: 6 weeks   PLANNED INTERVENTIONS: Therapeutic exercises, Therapeutic activity, Neuromuscular re-education, Balance training, Gait training, Patient/Family education, Joint mobilization, Stair training, Dry Needling, and Manual therapy   PLAN FOR NEXT SESSION: Hip strengthening, quad strengthening for patellar stability; train correct body mechanics with functional tasks such as squatting; manual techniques as needed.      Valentina Gu, PT, DPT #[id number]  Eilleen Kempf, PT 07/10/2021, 2:54 PM

## 2021-07-17 ENCOUNTER — Encounter: Payer: Medicaid Other | Admitting: Physical Therapy

## 2021-07-24 ENCOUNTER — Encounter: Payer: Medicaid Other | Admitting: Physical Therapy

## 2021-09-19 ENCOUNTER — Other Ambulatory Visit: Payer: Self-pay | Admitting: Family Medicine

## 2021-09-19 DIAGNOSIS — F32A Depression, unspecified: Secondary | ICD-10-CM

## 2021-09-19 MED ORDER — SERTRALINE HCL 100 MG PO TABS: 100.0000 mg | ORAL_TABLET | Freq: Every day | ORAL | 0 refills | Status: DC

## 2021-09-19 NOTE — Telephone Encounter (Signed)
Refilled 09/19/2021 #90 0 refillls. Requested Prescriptions  Pending Prescriptions Disp Refills  . sertraline (ZOLOFT) 100 MG tablet [Pharmacy Med Name: SERTRALINE HCL 100 MG TABLET] 90 tablet 0    Sig: TAKE 1 TABLET BY MOUTH EVERY DAY     Psychiatry:  Antidepressants - SSRI - sertraline Passed - 09/19/2021  9:48 AM      Passed - AST in normal range and within 360 days    AST  Date Value Ref Range Status  05/03/2021 18 0 - 40 IU/L Final   SGOT(AST)  Date Value Ref Range Status  10/29/2011 22 0 - 26 Unit/L Final         Passed - ALT in normal range and within 360 days    ALT  Date Value Ref Range Status  05/03/2021 14 0 - 32 IU/L Final   SGPT (ALT)  Date Value Ref Range Status  10/29/2011 19 12 - 78 U/L Final         Passed - Completed PHQ-2 or PHQ-9 in the last 360 days      Passed - Valid encounter within last 6 months    Recent Outpatient Visits          2 months ago Patellofemoral maltracking   [place] [facility] at [place], Earley Abide, MD   2 months ago Left lower quadrant abdominal pain   [place] [facility] at [place], Earley Abide, MD   3 months ago Acute pharyngitis, unspecified etiology   [place] [facility] at [place], Earley Abide, MD   4 months ago Patellofemoral maltracking   [place] [facility] at [place], Earley Abide, MD   4 months ago Annual physical exam   [facility] Medicine at [facility], Earley Abide, MD      Future Appointments            In 7 months Zigmund Daniel, Earley Abide, MD [facility] Primary Care and Sports Medicine at [facility], [facility]

## 2021-12-15 ENCOUNTER — Other Ambulatory Visit: Payer: Self-pay | Admitting: Family Medicine

## 2021-12-15 DIAGNOSIS — F32A Depression, unspecified: Secondary | ICD-10-CM

## 2021-12-15 DIAGNOSIS — F419 Anxiety disorder, unspecified: Secondary | ICD-10-CM

## 2021-12-17 MED ORDER — SERTRALINE HCL 100 MG PO TABS: 100.0000 mg | ORAL_TABLET | Freq: Every day | ORAL | 0 refills | Status: DC

## 2021-12-17 NOTE — Telephone Encounter (Signed)
Unable to refill per protocol, last refill by  provider 1120/23 for 90 days. Will refuse duplicate request.  Requested Prescriptions  Pending Prescriptions Disp Refills   sertraline (ZOLOFT) 100 MG tablet [Pharmacy Med Name: SERTRALINE HCL 100 MG TABLET] 90 tablet 0    Sig: TAKE 1 TABLET BY MOUTH EVERY DAY     Psychiatry:  Antidepressants - SSRI - sertraline Passed - 12/15/2021  5:58 PM      Passed - AST in normal range and within 360 days    AST  Date Value Ref Range Status  05/03/2021 18 0 - 40 IU/L Final   SGOT(AST)  Date Value Ref Range Status  10/29/2011 22 0 - 26 Unit/L Final         Passed - ALT in normal range and within 360 days    ALT  Date Value Ref Range Status  05/03/2021 14 0 - 32 IU/L Final   SGPT (ALT)  Date Value Ref Range Status  10/29/2011 19 12 - 78 U/L Final         Passed - Completed PHQ-2 or PHQ-9 in the last 360 days      Passed - Valid encounter within last 6 months    Recent Outpatient Visits           5 months ago Patellofemoral maltracking   [place] [facility] at [place], Earley Abide, MD   5 months ago Left lower quadrant abdominal pain   [place] [facility] at [place], Earley Abide, MD   6 months ago Acute pharyngitis, unspecified etiology   [place] [facility] at [place], Earley Abide, MD   6 months ago Patellofemoral maltracking   [place] [facility] at [place], Earley Abide, MD   7 months ago Annual physical exam   [facility] and [facility] Medicine at [facility], Earley Abide, MD       Future Appointments             In 4 months Zigmund Daniel, Earley Abide, MD [facility] Primary Care and Sports Medicine at [facility], [facility]

## 2021-12-31 ENCOUNTER — Encounter: Payer: Self-pay | Admitting: Family Medicine

## 2021-12-31 ENCOUNTER — Ambulatory Visit (INDEPENDENT_AMBULATORY_CARE_PROVIDER_SITE_OTHER): Payer: Medicaid Other | Admitting: Family Medicine

## 2021-12-31 VITALS — BP 108/62 | HR 76 | Temp 98.7°F | Ht 61.0 in | Wt 125.2 lb

## 2021-12-31 DIAGNOSIS — H6691 Otitis media, unspecified, right ear: Secondary | ICD-10-CM

## 2021-12-31 MED ORDER — AZITHROMYCIN 250 MG PO TABS: ORAL_TABLET | ORAL | 0 refills | Status: AC

## 2021-12-31 NOTE — Assessment & Plan Note (Signed)
10-day history of congestion, productive cough, initial sinus pressure, subsequent ear fullness, right greater than left.  Denies any fevers chills, has been dosing Tylenol and giving time to address this.  Has a toddler with similar, worse symptoms.  Examination reveals mildly ill-appearing individual resting in no acute distress, benign cardiac sounds, lung fields are clear to auscultation bilaterally without wheezes, rales, rhonchi, oropharynx with mild erythema, no exudate, nasopharynx with swollen erythematous turbinates bilaterally, right tympanic membrane with fluid posteriorly, mild erythema, contralateral occluded with dark cerumen.  Shotty cervical lymphadenopathy bilaterally.  History and findings are most consistent with otitis media, sinusitis, given the duration of symptoms and findings today plan for azithromycin course and supportive care.  She can follow-up as needed for this issue.

## 2021-12-31 NOTE — Progress Notes (Signed)
     Primary Care / Sports Medicine Office Visit  Patient Information:  Patient ID: Jeanette Miranda, female DOB: [birth year] Age: 31 y.o. MRN: [id number]   Jeanette Miranda is a pleasant 31 y.o. female presenting with the following:  Chief Complaint  Patient presents with   Ear Pain    Right ear since this morning   Cough    For 10 days, yellow phlegm.     Vitals:   12/31/21 1313  BP: 108/62  Pulse: 76  Temp: 98.7 F (37.1 C)  SpO2: 99%   Vitals:   12/31/21 1313  Weight: 125 lb 3.2 oz (56.8 kg)  Height: '5\' 1"'$  (1.549 m)   Body mass index is 23.66 kg/m.  No results found.   Independent interpretation of notes and tests performed by another provider:   None  Procedures performed:   None  Pertinent History, Exam, Impression, and Recommendations:   Problem List Items Addressed This Visit       Nervous and Auditory   Otitis of right ear - Primary    10-day history of congestion, productive cough, initial sinus pressure, subsequent ear fullness, right greater than left.  Denies any fevers chills, has been dosing Tylenol and giving time to address this.  Has a toddler with similar, worse symptoms.  Examination reveals mildly ill-appearing individual resting in no acute distress, benign cardiac sounds, lung fields are clear to auscultation bilaterally without wheezes, rales, rhonchi, oropharynx with mild erythema, no exudate, nasopharynx with swollen erythematous turbinates bilaterally, right tympanic membrane with fluid posteriorly, mild erythema, contralateral occluded with dark cerumen.  Shotty cervical lymphadenopathy bilaterally.  History and findings are most consistent with otitis media, sinusitis, given the duration of symptoms and findings today plan for azithromycin course and supportive care.  She can follow-up as needed for this issue.      Relevant Medications   azithromycin (ZITHROMAX) 250 MG tablet     Orders & Medications Meds ordered this encounter   Medications   azithromycin (ZITHROMAX) 250 MG tablet    Sig: Take 2 tablets on day 1, then 1 tablet daily on days 2 through 5    Dispense:  6 tablet    Refill:  0   No orders of the defined types were placed in this encounter.    No follow-ups on file.     Montel Culver, MD, [facility]   Primary Care Sports Medicine Primary Care and Sports Medicine at [facility]

## 2021-12-31 NOTE — Patient Instructions (Signed)
-   Dose antibiotics for full course - Recommend over-the-counter probiotic while on antibiotics - Can use over-the-counter medications for cough - Contact us for any persistent symptoms after the above, follow-up as needed

## 2022-04-03 ENCOUNTER — Other Ambulatory Visit: Payer: Self-pay | Admitting: Family Medicine

## 2022-04-03 DIAGNOSIS — F419 Anxiety disorder, unspecified: Secondary | ICD-10-CM

## 2022-04-03 MED ORDER — SERTRALINE HCL 100 MG PO TABS: 100.0000 mg | ORAL_TABLET | Freq: Every day | ORAL | 0 refills | Status: DC

## 2022-04-03 NOTE — Telephone Encounter (Signed)
Duplicate request- filled by office today Requested Prescriptions  Pending Prescriptions Disp Refills   sertraline (ZOLOFT) 100 MG tablet [Pharmacy Med Name: SERTRALINE HCL 100 MG TABLET] 90 tablet 0    Sig: TAKE 1 TABLET BY MOUTH EVERY DAY     Psychiatry:  Antidepressants - SSRI - sertraline Passed - 04/03/2022  9:45 AM      Passed - AST in normal range and within 360 days    AST  Date Value Ref Range Status  05/03/2021 18 0 - 40 IU/L Final   SGOT(AST)  Date Value Ref Range Status  10/29/2011 22 0 - 26 Unit/L Final         Passed - ALT in normal range and within 360 days    ALT  Date Value Ref Range Status  05/03/2021 14 0 - 32 IU/L Final   SGPT (ALT)  Date Value Ref Range Status  10/29/2011 19 12 - 78 U/L Final         Passed - Completed PHQ-2 or PHQ-9 in the last 360 days      Passed - Valid encounter within last 6 months    Recent Outpatient Visits           3 months ago Otitis of right ear   [place] [facility] at [place], Earley Abide, MD   9 months ago Patellofemoral maltracking   [place] [facility] [place] [facility] [place], Earley Abide, MD   9 months ago Left lower quadrant abdominal pain   [place] [facility] [place] [facility] [place], Earley Abide, MD   9 months ago Acute pharyngitis, unspecified etiology   [place] [facility] [place] at [facility], Earley Abide, MD   10 months ago Patellofemoral maltracking   [facility] [place] [facility] [place] at [facility], Earley Abide, MD       Future Appointments             In 4 weeks Zigmund Daniel, Earley Abide, MD [place] at [facility], [facility]

## 2022-05-02 ENCOUNTER — Ambulatory Visit (INDEPENDENT_AMBULATORY_CARE_PROVIDER_SITE_OTHER): Payer: Medicaid Other | Admitting: Family Medicine

## 2022-05-02 ENCOUNTER — Encounter: Payer: Self-pay | Admitting: Family Medicine

## 2022-05-02 VITALS — BP 110/80 | HR 62 | Ht 61.0 in | Wt 121.0 lb

## 2022-05-02 DIAGNOSIS — Z13220 Encounter for screening for lipoid disorders: Secondary | ICD-10-CM

## 2022-05-02 DIAGNOSIS — M222X9 Patellofemoral disorders, unspecified knee: Secondary | ICD-10-CM

## 2022-05-02 DIAGNOSIS — E559 Vitamin D deficiency, unspecified: Secondary | ICD-10-CM

## 2022-05-02 DIAGNOSIS — F32A Depression, unspecified: Secondary | ICD-10-CM

## 2022-05-02 DIAGNOSIS — Z0000 Encounter for general adult medical examination without abnormal findings: Secondary | ICD-10-CM

## 2022-05-02 DIAGNOSIS — F419 Anxiety disorder, unspecified: Secondary | ICD-10-CM | POA: Diagnosis not present

## 2022-05-02 MED ORDER — DULOXETINE HCL 30 MG PO CPEP
30.0000 mg | ORAL_CAPSULE | Freq: Every day | ORAL | 0 refills | Status: DC
Start: 2022-05-02 — End: 2022-05-30

## 2022-05-02 MED ORDER — SERTRALINE HCL 50 MG PO TABS: 50.0000 mg | ORAL_TABLET | Freq: Every day | ORAL | 0 refills | Status: DC

## 2022-05-02 NOTE — Progress Notes (Signed)
Annual Physical Exam Visit  Patient Information:  Patient ID: Jeanette Miranda, female DOB: December 15, 1990 Age: 32 y.o. MRN: [id number]   Subjective:   CC: Annual Physical Exam  HPI:  Jeanette Miranda is here for their annual physical.  I reviewed the past medical history, family history, social history, surgical history, and allergies today and changes were made as necessary.  Please see the problem list section below for additional details.  Past Medical History: Past Medical History:  Diagnosis Date   Allergy    Asthma    GERD (gastroesophageal reflux disease)    Hyperemesis 11/14/2018   UTI (urinary tract infection)    Past Surgical History: Past Surgical History:  Procedure Laterality Date   BREAST BIOPSY Right 2019   benign and currently asymptomatic   TONSILLECTOMY AND ADENOIDECTOMY  2012   WISDOM TOOTH EXTRACTION Bilateral 2011   Family History: Family History  Problem Relation Age of Onset   Crohn's disease Mother    Asthma Mother    Depression Mother    Early death Mother    Anxiety disorder Mother    Arthritis Father    Depression Father    Anxiety disorder Father    Glaucoma Father    Asthma Brother    Heart disease Maternal Grandfather    Arthritis Paternal Grandmother    Depression Paternal Grandmother    Mental retardation Paternal Grandmother    Heart disease Paternal Grandfather    Arthritis Paternal Grandfather    Allergies: Allergies  Allergen Reactions   Reglan [Metoclopramide] Other (See Comments)    Hot flash, unpleasant feeling    Health Maintenance: Health Maintenance  Topic Date Due   INFLUENZA VACCINE  08/29/2022   PAP SMEAR-Modifier  01/31/2023   DTaP/Tdap/Td (2 - Td or Tdap) 04/19/2029   Hepatitis C Screening  Completed   HIV Screening  Completed   HPV VACCINES  Aged Out   COVID-19 Vaccine  Discontinued    HM Colonoscopy     This patient has no relevant Health Maintenance data.      Medications: Current  Outpatient Medications on File Prior to Visit  Medication Sig Dispense Refill   albuterol (VENTOLIN HFA) 108 (90 Base) MCG/ACT inhaler Inhale 1-2 puffs into the lungs every 6 (six) hours as needed for wheezing or shortness of breath. 6.7 g 2   celecoxib (CELEBREX) 100 MG capsule Take 1 capsule (100 mg total) by mouth 2 (two) times daily as needed. 60 capsule 0   levonorgestrel (MIRENA) 20 MCG/24HR IUD 1 each by Intrauterine route once.     No current facility-administered medications on file prior to visit.    Review of Systems: No headache, visual changes, nausea, vomiting, diarrhea, constipation, dizziness, abdominal pain, skin rash, fevers, chills, night sweats, swollen lymph nodes, weight loss, chest pain, body aches, joint swelling, muscle aches, shortness of breath, mood changes, visual or auditory hallucinations reported.  Objective:   Vitals:   05/02/22 0829  BP: 110/80  Pulse: 62  SpO2: 99%   Vitals:   05/02/22 0829  Weight: 121 lb (54.9 kg)  Height: 5\' 1"  (1.549 m)   Body mass index is 22.86 kg/m.  General: Well Developed, well nourished, and in no acute distress.  Neuro: Alert and oriented x3, extra-ocular muscles intact, sensation grossly intact. Cranial nerves II through XII are grossly intact, motor, sensory, and coordinative functions are intact. HEENT: Normocephalic, atraumatic, pupils equal round reactive to light, neck supple, no masses, no lymphadenopathy, thyroid nonpalpable.  Oropharynx, nasopharynx, external ear canals are unremarkable. Skin: Warm and dry, no rashes noted.  Cardiac: Regular rate and rhythm, no murmurs rubs or gallops. No peripheral edema. Pulses symmetric. Respiratory: Clear to auscultation bilaterally. Not using accessory muscles, speaking in full sentences.  Abdominal: Soft, nontender, nondistended, positive bowel sounds, no masses, no organomegaly. Musculoskeletal: Shoulder, elbow, wrist, hip, knee, ankle stable, and with full range of  motion.  Female chaperone initials: AH present throughout the physical examination.  Impression and Recommendations:   The patient was counselled, risk factors were discussed, and anticipatory guidance given.  Problem List Items Addressed This Visit       Musculoskeletal and Integument   Patellofemoral maltracking    Persistent symptoms, somewhat controlled with as needed Celebrex.  Plan to initiate duloxetine with titration goal 60 mg. See additional assessment(s) for plan details.        Other   Annual physical exam - Primary    Annual examination completed, risk stratification labs ordered, anticipatory guidance provided.  We will follow labs once resulted.      Relevant Orders   CBC   Comprehensive metabolic panel   Lipid panel   TSH   VITAMIN D 25 Hydroxy (Vit-D Deficiency, Fractures)   Anxiety and depression    Suboptimal control, given comorbid musculoskeletal concerns, plan as follows:  -Gradually titrate down from sertraline while titrating up on duloxetine, specifically: Sertraline (Zoloft) Days 1-3: Sertraline 100 mg (2 x 50 mg) daily Days 4-14: Sertraline 50 mg daily Day 15 onward stop sertraline Duloxetine (Cymbalta) Days 4-14: Duloxetine 30 mg daily Days 15 onward duloxetine 60 mg daily      Relevant Medications   DULoxetine (CYMBALTA) 30 MG capsule   sertraline (ZOLOFT) 50 MG tablet   Other Visit Diagnoses     Healthcare maintenance       Relevant Orders   CBC   Comprehensive metabolic panel   Lipid panel   TSH   VITAMIN D 25 Hydroxy (Vit-D Deficiency, Fractures)   Vitamin D deficiency       Relevant Orders   VITAMIN D 25 Hydroxy (Vit-D Deficiency, Fractures)   Screening for lipoid disorders       Relevant Orders   Comprehensive metabolic panel   Lipid panel        Orders & Medications Medications:  Meds ordered this encounter  Medications   DULoxetine (CYMBALTA) 30 MG capsule    Sig: Take 1 capsule (30 mg total) by mouth daily.     Dispense:  60 capsule    Refill:  0   sertraline (ZOLOFT) 50 MG tablet    Sig: Take 1 tablet (50 mg total) by mouth daily.    Dispense:  60 tablet    Refill:  0   Orders Placed This Encounter  Procedures   CBC   Comprehensive metabolic panel   Lipid panel   TSH   VITAMIN D 25 Hydroxy (Vit-D Deficiency, Fractures)     Return in about 4 weeks (around 05/30/2022) for medicaiton follow-up.    Montel Culver, MD, [facility]   Primary Care Sports Medicine Primary Care and Sports Medicine at [facility]

## 2022-05-02 NOTE — Assessment & Plan Note (Signed)
Suboptimal control, given comorbid musculoskeletal concerns, plan as follows:  -Gradually titrate down from sertraline while titrating up on duloxetine, specifically: Sertraline (Zoloft) Days 1-3: Sertraline 100 mg (2 x 50 mg) daily Days 4-14: Sertraline 50 mg daily Day 15 onward stop sertraline Duloxetine (Cymbalta) Days 4-14: Duloxetine 30 mg daily Days 15 onward duloxetine 60 mg daily

## 2022-05-02 NOTE — Patient Instructions (Addendum)
-   Obtain fasting labs with orders provided (can have water or black coffee but otherwise no food or drink x 8 hours before labs) - Review information provided - Attend eye doctor annually, dentist every 6 months, work towards or maintain 30 minutes of moderate intensity physical activity at least 5 days per week, and consume a balanced diet - Return in 1 month - Contact us for any questions between now and then  Follow medication adjustment as below: Sertraline (Zoloft) Days 1-3: Sertraline 100 mg (2 x 50 mg) daily Days 4-14: Sertraline 50 mg daily Day 15 onward stop sertraline Duloxetine (Cymbalta) Days 4-14: Duloxetine 30 mg daily Days 15 onward duloxetine 60 mg daily

## 2022-05-02 NOTE — Assessment & Plan Note (Signed)
Annual examination completed, risk stratification labs ordered, anticipatory guidance provided.  We will follow labs once resulted. 

## 2022-05-02 NOTE — Assessment & Plan Note (Signed)
Persistent symptoms, somewhat controlled with as needed Celebrex.  Plan to initiate duloxetine with titration goal 60 mg. See additional assessment(s) for plan details.

## 2022-05-03 LAB — LIPID PANEL
Chol/HDL Ratio: 2.4 ratio (ref 0.0–4.4)
Cholesterol, Total: 145 mg/dL (ref 100–199)
HDL: 61 mg/dL (ref >39)
LDL Chol Calc (NIH): 73 mg/dL (ref 0–99)
Triglycerides: 50 mg/dL (ref 0–149)
VLDL Cholesterol Cal: 11 mg/dL (ref 5–40)

## 2022-05-03 LAB — CBC
Hematocrit: 43.1 % (ref 34.0–46.6)
Hemoglobin: 14.6 g/dL (ref 11.1–15.9)
MCH: 29.6 pg (ref 26.6–33.0)
MCHC: 33.9 g/dL (ref 31.5–35.7)
MCV: 87 fL (ref 79–97)
Platelets: 183 10*3/uL (ref 150–450)
RBC: 4.93 x10E6/uL (ref 3.77–5.28)
RDW: 12.5 % (ref 11.7–15.4)
WBC: 5.3 10*3/uL (ref 3.4–10.8)

## 2022-05-03 LAB — COMPREHENSIVE METABOLIC PANEL
ALT: 17 IU/L (ref 0–32)
AST: 16 IU/L (ref 0–40)
Albumin/Globulin Ratio: 2.3 — ABNORMAL HIGH (ref 1.2–2.2)
Albumin: 4.6 g/dL (ref 4.0–5.0)
Alkaline Phosphatase: 63 IU/L (ref 44–121)
BUN/Creatinine Ratio: 22 (ref 9–23)
BUN: 14 mg/dL (ref 6–20)
Bilirubin Total: 0.6 mg/dL (ref 0.0–1.2)
CO2: 23 mmol/L (ref 20–29)
Calcium: 9.5 mg/dL (ref 8.7–10.2)
Chloride: 104 mmol/L (ref 96–106)
Creatinine, Ser: 0.64 mg/dL (ref 0.57–1.00)
Globulin, Total: 2 g/dL (ref 1.5–4.5)
Glucose: 86 mg/dL (ref 70–99)
Potassium: 4.1 mmol/L (ref 3.5–5.2)
Sodium: 141 mmol/L (ref 134–144)
Total Protein: 6.6 g/dL (ref 6.0–8.5)
eGFR: 122 mL/min/{1.73_m2} (ref >59)

## 2022-05-03 LAB — TSH: TSH: 1.17 u[IU]/mL (ref 0.450–4.500)

## 2022-05-03 LAB — VITAMIN D 25 HYDROXY (VIT D DEFICIENCY, FRACTURES): Vit D, 25-Hydroxy: 24.8 ng/mL — ABNORMAL LOW (ref 30.0–100.0)

## 2022-05-20 ENCOUNTER — Encounter: Payer: Self-pay | Admitting: Family Medicine

## 2022-05-20 ENCOUNTER — Other Ambulatory Visit: Payer: Self-pay | Admitting: Family Medicine

## 2022-05-20 DIAGNOSIS — E559 Vitamin D deficiency, unspecified: Secondary | ICD-10-CM

## 2022-05-20 MED ORDER — VITAMIN D (ERGOCALCIFEROL) 1.25 MG (50000 UNIT) PO CAPS
50000.0000 [IU] | ORAL_CAPSULE | ORAL | 0 refills | Status: DC
Start: 2022-05-20 — End: 2022-08-30

## 2022-05-20 NOTE — Telephone Encounter (Signed)
Please advise 

## 2022-05-30 ENCOUNTER — Encounter: Payer: Self-pay | Admitting: Family Medicine

## 2022-05-30 ENCOUNTER — Ambulatory Visit (INDEPENDENT_AMBULATORY_CARE_PROVIDER_SITE_OTHER): Payer: Medicaid Other | Admitting: Family Medicine

## 2022-05-30 VITALS — BP 108/78 | HR 68 | Wt 119.8 lb

## 2022-05-30 DIAGNOSIS — J309 Allergic rhinitis, unspecified: Secondary | ICD-10-CM

## 2022-05-30 DIAGNOSIS — F419 Anxiety disorder, unspecified: Secondary | ICD-10-CM | POA: Diagnosis not present

## 2022-05-30 DIAGNOSIS — F32A Depression, unspecified: Secondary | ICD-10-CM | POA: Diagnosis not present

## 2022-05-30 DIAGNOSIS — M222X9 Patellofemoral disorders, unspecified knee: Secondary | ICD-10-CM | POA: Diagnosis not present

## 2022-05-30 MED ORDER — DULOXETINE HCL 60 MG PO CPEP: 60.0000 mg | ORAL_CAPSULE | Freq: Every day | ORAL | 1 refills | Status: DC

## 2022-05-30 NOTE — Patient Instructions (Signed)
-   Continue Cymbalta/duloxetine 60 mg daily - Start Flonase, can add Claritin/Zyrtec if indicated - Can contact us for persistent symptoms - Virtual video follow-up in 3 months

## 2022-05-30 NOTE — Assessment & Plan Note (Signed)
Was able to successfully wean down from sertraline and start/titrate duloxetine.  Noting significant improvements in mood and pain, reports "have not felt this good in 10 years ".  No adverse effects reported after some initial GI upset during titration.  - Continue duloxetine 60 mg daily - Virtual video follow-up in 3 months to assess dose

## 2022-05-30 NOTE — Assessment & Plan Note (Signed)
Excellent symptom control after starting/titrating duloxetine, only 1 minor episode of knee pain that was transient.  -Continue duloxetine 60 mg daily

## 2022-05-30 NOTE — Assessment & Plan Note (Signed)
Chronic, currently symptomatic, examination shows focality to the nasopharynx with turbinate swelling, no erythema, tympanic membranes, oropharynx benign, nontender sinuses.  - Flonase, can add Claritin/Zyrtec if indicated - Can contact us for persistent symptoms

## 2022-05-30 NOTE — Progress Notes (Signed)
     Primary Care / Sports Medicine Office Visit  Patient Information:  Patient ID: Jeanette Miranda, female DOB: [birth year] Age: 32 y.o. MRN: [id number]   Jeanette Miranda is a pleasant 32 y.o. female presenting with the following:  No chief complaint on file.   Vitals:   05/30/22 0955  BP: 108/78  Pulse: 68  SpO2: 98%   Vitals:   05/30/22 0955  Weight: 119 lb 12.8 oz (54.3 kg)   Body mass index is 22.64 kg/m.  No results found.   Independent interpretation of notes and tests performed by another provider:   None  Procedures performed:   None  Pertinent History, Exam, Impression, and Recommendations:   Anxiety and depression Assessment & Plan: Was able to successfully wean down from sertraline and start/titrate duloxetine.  Noting significant improvements in mood and pain, reports "have not felt this good in 10 years ".  No adverse effects reported after some initial GI upset during titration.  - Continue duloxetine 60 mg daily - Virtual video follow-up in 3 months to assess dose  Orders: -     DULoxetine HCl; Take 1 capsule (60 mg total) by mouth daily.  Dispense: 90 capsule; Refill: 1  Allergic rhinitis, unspecified seasonality, unspecified trigger Assessment & Plan: Chronic, currently symptomatic, examination shows focality to the nasopharynx with turbinate swelling, no erythema, tympanic membranes, oropharynx benign, nontender sinuses.  - Flonase, can add Claritin/Zyrtec if indicated - Can contact us for persistent symptoms   Patellofemoral maltracking Assessment & Plan: Excellent symptom control after starting/titrating duloxetine, only 1 minor episode of knee pain that was transient.  -Continue duloxetine 60 mg daily      Orders & Medications Meds ordered this encounter  Medications   DULoxetine (CYMBALTA) 60 MG capsule    Sig: Take 1 capsule (60 mg total) by mouth daily.    Dispense:  90 capsule    Refill:  1   No orders of the defined  types were placed in this encounter.    No follow-ups on file.     [name], MD, [facility]   Primary Care Sports Medicine Primary Care and Sports Medicine at [facility]

## 2022-06-17 ENCOUNTER — Ambulatory Visit (INDEPENDENT_AMBULATORY_CARE_PROVIDER_SITE_OTHER): Payer: Medicaid Other | Admitting: Obstetrics and Gynecology

## 2022-06-17 ENCOUNTER — Encounter: Payer: Self-pay | Admitting: Obstetrics and Gynecology

## 2022-06-17 VITALS — BP 98/60 | Ht 61.0 in | Wt 120.0 lb

## 2022-06-17 DIAGNOSIS — N644 Mastodynia: Secondary | ICD-10-CM

## 2022-06-17 NOTE — Progress Notes (Signed)
Jeanette Banana, MD   Chief Complaint  Patient presents with   Breast Exam    Constant on LB only, painful lump below nipple x couple of months    HPI:      [name] is a 32 y.o. [id number] whose LMP was No LMP recorded. (Menstrual status: IUD)., presents today for LT breast pain for a couple months. Feels mass near nipple with constant pain, radiates to nipple. No change in size since first noticed it. No nipple d/c, no erythema/prior trauma. Tried tylenol without relief. Had similar sx RT breast 4/19 with neg u/s and resolved with vacuum biopsy of area with Dr. Lemar Miranda. No sx since. No FH breast/ovar ca.   Patient Active Problem List   Diagnosis Date Noted   Otitis of right ear 12/31/2021   Allergic rhinitis 06/26/2021   Annual physical exam 04/30/2021   Acneiform rash 04/30/2021   GERD (gastroesophageal reflux disease)    Asthma    Contraceptive, surveillance, intrauterine device 01/31/2020   Encounter for medical examination to establish care 10/13/2019   Patellofemoral maltracking 10/13/2019   Indication for care in labor or delivery 06/10/2019   Preterm delivery, delivered 06/10/2019   Hematuria 05/23/2019   Threatened preterm labor, third trimester 05/17/2019   Vaginal bleeding in pregnancy, third trimester 05/16/2019   Supervision of low-risk pregnancy 11/13/2018   Anxiety and depression 11/13/2018   Endometriosis 08/26/2018   Left lower quadrant abdominal pain 12/18/2017   Mastalgia 04/01/2017   Breast mass in female 03/07/2017    Past Surgical History:  Procedure Laterality Date   BREAST BIOPSY Right 2019   benign and currently asymptomatic   TONSILLECTOMY AND ADENOIDECTOMY  2012   WISDOM TOOTH EXTRACTION Bilateral 2011    Family History  Problem Relation Age of Onset   Crohn's disease Mother    Asthma Mother    Depression Mother    Early death Mother    Anxiety disorder Mother    Arthritis Father    Depression Father    Anxiety disorder  Father    Glaucoma Father    Asthma Brother    Heart disease Maternal Grandfather    Arthritis Paternal Grandmother    Depression Paternal Grandmother    Mental retardation Paternal Grandmother    Heart disease Paternal Grandfather    Arthritis Paternal Grandfather     Social History   Socioeconomic History   Marital status: Married    Spouse name: Jeanette Miranda   Number of children: 2   Years of education: 12+   Highest education level: Some college, no degree  Occupational History   Not on file  Tobacco Use   Smoking status: Never   Smokeless tobacco: Never  Vaping Use   Vaping Use: Never used  Substance and Sexual Activity   Alcohol use: Not Currently   Drug use: Never   Sexual activity: Yes    Partners: Male    Birth control/protection: I.U.D.    Comment: Mirena  Other Topics Concern   Not on file  Social History Narrative   Not on file   Social Determinants of Health   Financial Resource Strain: Not on file  Food Insecurity: No Food Insecurity (12/31/2021)   Hunger Vital Sign    Worried About Running Out of Food in the Last Year: Never true    Ran Out of Food in the Last Year: Never true  Transportation Needs: No Transportation Needs (12/31/2021)   PRAPARE - Transportation  Lack of Transportation (Medical): No    Lack of Transportation (Non-Medical): No  Physical Activity: Not on file  Stress: Not on file  Social Connections: Not on file  Intimate Partner Violence: Not At Risk (12/31/2021)   Humiliation, Afraid, Rape, and Kick questionnaire    Fear of Current or Ex-Partner: No    Emotionally Abused: No    Physically Abused: No    Sexually Abused: No    Outpatient Medications Prior to Visit  Medication Sig Dispense Refill   albuterol (VENTOLIN HFA) 108 (90 Base) MCG/ACT inhaler Inhale 1-2 puffs into the lungs every 6 (six) hours as needed for wheezing or shortness of breath. 6.7 g 2   DULoxetine (CYMBALTA) 60 MG capsule Take 1 capsule (60 mg total) by  mouth daily. 90 capsule 1   levonorgestrel (MIRENA) 20 MCG/24HR IUD 1 each by Intrauterine route once.     Vitamin D, Ergocalciferol, (DRISDOL) 1.25 MG (50000 UNIT) CAPS capsule Take 1 capsule (50,000 Units total) by mouth every 7 (seven) days. Take for 8 total doses(weeks) 8 capsule 0   No facility-administered medications prior to visit.      ROS:  Review of Systems  Constitutional:  Negative for fever.  Gastrointestinal:  Negative for blood in stool, constipation, diarrhea, nausea and vomiting.  Genitourinary:  Negative for dyspareunia, dysuria, flank pain, frequency, hematuria, urgency, vaginal bleeding, vaginal discharge and vaginal pain.  Musculoskeletal:  Negative for back pain.  Skin:  Negative for rash.   BREAST: lumps/pain   OBJECTIVE:   Vitals:  BP 98/60   Ht 5\' 1"  (1.549 m)   Wt 120 lb (54.4 kg)   Breastfeeding No   BMI 22.67 kg/m   Physical Exam Vitals reviewed.  Pulmonary:     Effort: Pulmonary effort is normal.  Chest:  Breasts:    Breasts are symmetrical.     Right: No inverted nipple, mass, nipple discharge, skin change or tenderness.     Left: Mass present. No inverted nipple, nipple discharge, skin change or tenderness.    Musculoskeletal:        General: Normal range of motion.     Cervical back: Normal range of motion.  Skin:    General: Skin is warm and dry.  Neurological:     General: No focal deficit present.     Mental Status: She is alert and oriented to person, place, and time.     Cranial Nerves: No cranial nerve deficit.  Psychiatric:        Mood and Affect: Mood normal.        Behavior: Behavior normal.        Thought Content: Thought content normal.        Judgment: Judgment normal.     Assessment/Plan: Breast pain, left - Plan: [place] LIMITED ULTRASOUND INCLUDING AXILLA LEFT BREAST , MM 3D DIAGNOSTIC MAMMOGRAM BILATERAL BREAST, [place] LIMITED ULTRASOUND INCLUDING AXILLA RIGHT BREAST; tender mass LT breast, pt to schedule dx mammo and  u/s. Will f/u with results and mgmt. Most likely cyst given sx. NSAIDs/heating pad prn.    Return if symptoms worsen or fail to improve.  Jeanette Miranda B. Jeanette Divis, PA-C 06/17/2022 3:50 PM

## 2022-06-26 ENCOUNTER — Ambulatory Visit
Admission: RE | Admit: 2022-06-26 | Discharge: 2022-06-26 | Disposition: A | Discharge status: Home / Self Care | Payer: Medicaid Other | Source: Ambulatory Visit | Attending: Obstetrics and Gynecology | Admitting: Obstetrics and Gynecology

## 2022-06-26 DIAGNOSIS — N644 Mastodynia: Secondary | ICD-10-CM | POA: Insufficient documentation

## 2022-06-27 ENCOUNTER — Encounter: Payer: Self-pay | Admitting: Obstetrics and Gynecology

## 2022-06-27 DIAGNOSIS — N644 Mastodynia: Secondary | ICD-10-CM

## 2022-07-11 ENCOUNTER — Ambulatory Visit (INDEPENDENT_AMBULATORY_CARE_PROVIDER_SITE_OTHER): Payer: Medicaid Other | Admitting: Surgery

## 2022-07-11 ENCOUNTER — Encounter: Payer: Self-pay | Admitting: Surgery

## 2022-07-11 VITALS — BP 103/70 | HR 76 | Temp 98.0°F | Ht 61.0 in | Wt 118.0 lb

## 2022-07-11 DIAGNOSIS — N6320 Unspecified lump in the left breast, unspecified quadrant: Secondary | ICD-10-CM

## 2022-07-11 DIAGNOSIS — N644 Mastodynia: Secondary | ICD-10-CM

## 2022-07-11 NOTE — Patient Instructions (Addendum)
Evening primrose oil has been shown in double-blind studies to reduce the symptoms of breast pain. Evening primrose oil contains polyunsaturated fatty acids, which may be important to breast health. Many doctors recommend taking 3 grams per day for at least 6 months. You may also try Frankincense oil or Vitamin E supplements.  You may also try Ibuprofen 400mg  three times a day for 2 weeks.  Other options would be Neurontin or Gabapentin.   Follow-up with our office as needed.  Please call and ask to speak with a nurse if you develop questions or concerns.  Breast Tenderness Breast tenderness is a common problem for women of all ages, but may also occur in men. Breast tenderness has many possible causes, including hormone changes, infections, taking certain medicines, and caffeine intake. In women, the pain usually comes and goes with the menstrual cycle, but it can also be constant. Breast tenderness may range from mild discomfort to severe pain. You may have tests, such as a mammogram or an ultrasound, to check for any unusual findings. Having breast tenderness usually does not mean that you have breast cancer. Follow these instructions at home: Managing pain and discomfort  If directed, put ice on the painful area. To do this: Put ice in a plastic bag. Place a towel between your skin and the bag. Leave the ice on for 20 minutes, 2-3 times a day. If your skin turns bright red, remove the ice right away to prevent skin damage. The risk of skin damage is higher if you cannot feel pain, heat, or cold. Wear a supportive bra or chest support: During exercise. While sleeping, if your breasts are very tender. Medicines Take over-the-counter and prescription medicines only as told by your health care provider. If the cause of your pain is an infection, you may be prescribed an antibiotic medicine. If you were prescribed antibiotics, take them as told by your health care provider. Do not stop using  the antibiotic even if you start to feel better. Eating and drinking Decrease the amount of caffeine in your diet. Instead, drink more water and choose caffeine-free drinks. Your health care provider may recommend that you lessen the amount of fat in your diet. You can do this by: Limiting fried foods. Cooking foods using methods such as baking, boiling, grilling, and broiling. General instructions  Keep a log of the days and times when your breasts are most tender. Ask your health care provider how to do breast exams at home. This will help you notice if you have an unusual growth or lump. Keep all follow-up visits. Contact a health care provider if: Any part of your breast is hard, red, and hot to the touch. This may be a sign of infection. You are a woman and have a new or painful lump in your breast that remains after your menstrual period ends. You are not breastfeeding and you have fluid, especially blood or pus, coming out of your nipples. You have a fever. Your pain does not improve or it gets worse. Your pain is interfering with your daily activities. Summary Breast tenderness may range from mild discomfort to severe pain. Breast tenderness has many possible causes, including hormone changes, infections, taking certain medicines, and caffeine intake. It can be treated with ice, wearing a supportive bra or chest support, and medicines. Make changes to your diet as told by your health care provider. This information is not intended to replace advice given to you by your health care provider. Make  sure you discuss any questions you have with your health care provider. Document Revised: 03/28/2021 Document Reviewed: 03/28/2021 Elsevier Patient Education  2024 [employer].

## 2022-07-11 NOTE — Progress Notes (Signed)
Patient ID: Jeanette Miranda, female   DOB: March 10, 1990, 32 y.o.   MRN: [id number]  Chief Complaint: Left breast pain  History of Present Illness Jeanette Miranda is a 32 y.o. female with left breast pain around to just inferior to the nipple for approximately 3 months.  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  Her current birth control was intrauterine device Mirena.  She has no family history of breast cancer.  She began menstruating at the age of 27 she is gravida 2 para 2 with her first pregnancy at the age of 42.  She did breast-feed.  She does feel a lump, this was evaluated by ultrasound.  Denies any history of nipple discharge or skin changes. No follow-up mammography and left breast ultrasound, targeted only revealed some atherosclerotic changes in the left breast unusual for the patient in her 30s.  Past Medical History Past Medical History:  Diagnosis Date   Allergy    Asthma    GERD (gastroesophageal reflux disease)    Hyperemesis 11/14/2018   UTI (urinary tract infection)       Past Surgical History:  Procedure Laterality Date   BREAST BIOPSY Right 2019   benign and currently asymptomatic   TONSILLECTOMY AND ADENOIDECTOMY  2012   WISDOM TOOTH EXTRACTION Bilateral 2011    Allergies  Allergen Reactions   Reglan [Metoclopramide] Other (See Comments)    Hot flash, unpleasant feeling     Current Outpatient Medications  Medication Sig Dispense Refill   albuterol (VENTOLIN HFA) 108 (90 Base) MCG/ACT inhaler Inhale 1-2 puffs into the lungs every 6 (six) hours as needed for wheezing or shortness of breath. 6.7 g 2   DULoxetine (CYMBALTA) 60 MG capsule Take 1 capsule (60 mg total) by mouth daily. 90 capsule 1   levonorgestrel (MIRENA) 20 MCG/24HR IUD 1 each by Intrauterine route once.     Vitamin D, Ergocalciferol, (DRISDOL) 1.25 MG (50000 UNIT) CAPS capsule Take 1 capsule (50,000 Units total) by mouth every 7 (seven) days.  Take for 8 total doses(weeks) 8 capsule 0   No current facility-administered medications for this visit.    Family History Family History  Problem Relation Age of Onset   Crohn's disease Mother    Asthma Mother    Depression Mother    Early death Mother    Anxiety disorder Mother    Arthritis Father    Depression Father    Anxiety disorder Father    Glaucoma Father    Asthma Brother    Heart disease Maternal Grandfather    Arthritis Paternal Grandmother    Depression Paternal Grandmother    Mental retardation Paternal Grandmother    Heart disease Paternal Grandfather    Arthritis Paternal Grandfather       Social History Social History   Tobacco Use   Smoking status: Never    Passive exposure: Never   Smokeless tobacco: Never  Vaping Use   Vaping Use: Never used  Substance Use Topics   Alcohol use: Not Currently   Drug use: Never        Review of Systems  Constitutional: Negative.   HENT: Negative.    Eyes: Negative.   Respiratory: Negative.    Cardiovascular: Negative.   Gastrointestinal: Negative.   Genitourinary: Negative.   Skin: Negative.   Neurological: Negative.   Psychiatric/Behavioral:  Positive for depression. The patient is nervous/anxious.      Physical Exam Blood pressure 103/70,  pulse 76, temperature 98 F (36.7 C), height 5\' 1"  (1.549 m), weight 118 lb (53.5 kg), SpO2 99 %. Last Weight  Most recent update: 07/11/2022 10:54 AM    Weight  53.5 kg (118 lb)             CONSTITUTIONAL: Well developed, and nourished, appropriately responsive and aware without distress.   EYES: Sclera non-icteric.   EARS, NOSE, MOUTH AND THROAT:  The oropharynx is clear. Oral mucosa is pink and moist.     Hearing is intact to voice.  NECK: Trachea is midline, and there is no jugular venous distension.  LYMPH NODES:  Lymph nodes in the neck are not appreciated. RESPIRATORY:  Lungs are clear.  Normal respiratory effort without pathologic use of accessory  muscles. CARDIOVASCULAR:  Well perfused.  GI: The abdomen is  soft, nontender, and nondistended. There were no palpable masses. GU: Caryl Lyn present as chaperone: Left breast examination reveals dense breast tissue primarily localized centrally, with anticipated fibroglandular lobulations.  No distinct or dominant mass, nodular suspicious present MUSCULOSKELETAL:  Symmetrical muscle tone appreciated in all four extremities.    SKIN: Skin turgor is normal. No pathologic skin lesions appreciated.  NEUROLOGIC:  Motor and sensation appear grossly normal.  Cranial nerves are grossly without defect. PSYCH:  Alert and oriented to person, place and time. Affect is appropriate for situation.  Data Reviewed I have personally reviewed what is currently available of the patient's imaging, recent labs and medical records.   Labs:     Latest Ref Rng & Units 05/02/2022    9:19 AM 05/03/2021    8:58 AM 01/31/2020    9:46 AM  CBC  WBC 3.4 - 10.8 x10E3/uL 5.3  5.2  6.6   Hemoglobin 11.1 - 15.9 g/dL 57.8  46.9  62.9   Hematocrit 34.0 - 46.6 % 43.1  40.2  42.1   Platelets 150 - 450 x10E3/uL 183  204  187       Latest Ref Rng & Units 05/02/2022    9:19 AM 05/03/2021    8:58 AM 11/13/2018    3:55 PM  CMP  Glucose 70 - 99 mg/dL 86  88  76   BUN 6 - 20 mg/dL 14  11  11    Creatinine 0.57 - 1.00 mg/dL 5.28  4.13  2.44   Sodium 134 - 144 mmol/L 141  142  135   Potassium 3.5 - 5.2 mmol/L 4.1  3.8  3.7   Chloride 96 - 106 mmol/L 104  101  102   CO2 20 - 29 mmol/L 23  26  24    Calcium 8.7 - 10.2 mg/dL 9.5  9.3  9.6   Total Protein 6.0 - 8.5 g/dL 6.6  6.4  6.9   Total Bilirubin 0.0 - 1.2 mg/dL 0.6  0.4  1.0   Alkaline Phos 44 - 121 IU/L 63  83  36   AST 0 - 40 IU/L 16  18  16    ALT 0 - 32 IU/L 17  14  15        Imaging: Radiological images reviewed:  There were no ultrasounds imaging readily available for appreciation.  Mammography report to follow.  CLINICAL DATA:  Palpable lump in the left breast.    EXAM: DIGITAL DIAGNOSTIC BILATERAL MAMMOGRAM WITH TOMOSYNTHESIS; ULTRASOUND LEFT BREAST LIMITED   TECHNIQUE: Bilateral digital diagnostic mammography and breast tomosynthesis was performed.; Targeted ultrasound examination of the left breast was performed.   COMPARISON:  Previous exam(s).  ACR Breast Density Category c: The breasts are heterogeneously dense, which may obscure small masses.   FINDINGS: Scattered benign calcifications are noted. Calcified atherosclerotic changes are identified in vessels of the breast, mild. No suspicious masses or distortion.   Targeted ultrasound is performed, showing no suspicious masses in the region of the patient's symptoms.   IMPRESSION: 1. No mammographic or sonographic evidence of malignancy. 2. Benign scattered calcifications in the breast. 3. Mild calcified atherosclerotic change in the vessels of the breast. This is atypical for a patient of this age. Recommend clinical correlation.   RECOMMENDATION: Recommend annual screening mammography.   Recommend clinical correlation for calcified atherosclerotic changes described above.   I have discussed the findings and recommendations with the patient. If applicable, a reminder letter will be sent to the patient regarding the next appointment.   BI-RADS CATEGORY  2: Benign.     Electronically Signed   By: Gerome Sam III M.D.   On: 06/26/2022 14:28   Within last 24 hrs: No results found.  Assessment    Left breast pain of uncertain etiology.  We discussed various options in terms of the effect of Mirena, options to utilize other medications helpful for fibrocystic change including but not limited to: cohash, primrose oil, supplemental vitamin e, avoidance of caffeine etc. Patient Active Problem List   Diagnosis Date Noted   Otitis of right ear 12/31/2021   Allergic rhinitis 06/26/2021   Annual physical exam 04/30/2021   Acneiform rash 04/30/2021   GERD  (gastroesophageal reflux disease)    Asthma    Contraceptive, surveillance, intrauterine device 01/31/2020   Encounter for medical examination to establish care 10/13/2019   Patellofemoral maltracking 10/13/2019   Indication for care in labor or delivery 06/10/2019   Preterm delivery, delivered 06/10/2019   Hematuria 05/23/2019   Threatened preterm labor, third trimester 05/17/2019   Vaginal bleeding in pregnancy, third trimester 05/16/2019   Supervision of low-risk pregnancy 11/13/2018   Anxiety and depression 11/13/2018   Endometriosis 08/26/2018   Left lower quadrant abdominal pain 12/18/2017   Mastalgia 04/01/2017   Breast mass in female 03/07/2017    Plan    We discussed a trial of low-dose NSAIDs to see if they would be helpful.  Consultation with her GYN, to discuss possible role of her current birth control/hormonal status.  I do not feel a blind biopsy or excision of the breast tissue to be helpful or beneficial at this time. Will be glad to see her back again in 2 to 3 months or as needed anticipating improvement in the symptoms.  Face-to-face time spent with the patient and accompanying care providers(if present) was 30 minutes, with more than 50% of the time spent counseling, educating, and coordinating care of the patient.    These notes generated with voice recognition software. I apologize for typographical errors.  Campbell Lerner M.D., FACS 07/11/2022, 1:40 PM

## 2022-08-30 ENCOUNTER — Telehealth (INDEPENDENT_AMBULATORY_CARE_PROVIDER_SITE_OTHER): Payer: Medicaid Other | Admitting: Family Medicine

## 2022-08-30 ENCOUNTER — Encounter: Payer: Self-pay | Admitting: Family Medicine

## 2022-08-30 DIAGNOSIS — F419 Anxiety disorder, unspecified: Secondary | ICD-10-CM

## 2022-08-30 DIAGNOSIS — N63 Unspecified lump in unspecified breast: Secondary | ICD-10-CM

## 2022-08-30 DIAGNOSIS — F32A Depression, unspecified: Secondary | ICD-10-CM

## 2022-08-30 DIAGNOSIS — M222X9 Patellofemoral disorders, unspecified knee: Secondary | ICD-10-CM

## 2022-08-30 MED ORDER — CELECOXIB 100 MG PO CAPS
100.0000 mg | ORAL_CAPSULE | Freq: Two times a day (BID) | ORAL | 2 refills | Status: DC | PRN
Start: 2022-08-30 — End: 2023-01-30

## 2022-08-30 MED ORDER — DULOXETINE HCL 60 MG PO CPEP: 60.0000 mg | ORAL_CAPSULE | Freq: Every day | ORAL | 1 refills | Status: DC

## 2022-08-30 NOTE — Assessment & Plan Note (Signed)
Doing very well, tolerating medication without issues.  Plan to continue current course.

## 2022-08-30 NOTE — Progress Notes (Signed)
Primary Care / Sports Medicine Virtual Visit  Patient Information:  Patient ID: Jeanette Miranda, female DOB: 1990/11/18 Age: 32 y.o. MRN: [id number]   Jeanette Miranda is a pleasant 32 y.o. female presenting with the following:  Chief Complaint  Patient presents with   Anxiety and depression    Review of Systems: No fevers, chills, night sweats, weight loss, chest pain, or shortness of breath.   Patient Active Problem List   Diagnosis Date Noted   Allergic rhinitis 06/26/2021   Annual physical exam 04/30/2021   GERD (gastroesophageal reflux disease)    Asthma    Contraceptive, surveillance, intrauterine device 01/31/2020   Encounter for medical examination to establish care 10/13/2019   Patellofemoral maltracking 10/13/2019   Indication for care in labor or delivery 06/10/2019   Preterm delivery, delivered 06/10/2019   Hematuria 05/23/2019   Threatened preterm labor, third trimester 05/17/2019   Vaginal bleeding in pregnancy, third trimester 05/16/2019   Supervision of low-risk pregnancy 11/13/2018   Anxiety and depression 11/13/2018   Endometriosis 08/26/2018   Left lower quadrant abdominal pain 12/18/2017   Mastalgia 04/01/2017   Breast mass in female 03/07/2017   Past Medical History:  Diagnosis Date   Allergy    Asthma    GERD (gastroesophageal reflux disease)    Hyperemesis 11/14/2018   UTI (urinary tract infection)    Outpatient Encounter Medications as of 08/30/2022  Medication Sig   albuterol (VENTOLIN HFA) 108 (90 Base) MCG/ACT inhaler Inhale 1-2 puffs into the lungs every 6 (six) hours as needed for wheezing or shortness of breath.   celecoxib (CELEBREX) 100 MG capsule Take 1 capsule (100 mg total) by mouth 2 (two) times daily as needed.   DULoxetine (CYMBALTA) 60 MG capsule Take 1 capsule (60 mg total) by mouth daily.   levonorgestrel (MIRENA) 20 MCG/24HR IUD 1 each by Intrauterine route once.   [DISCONTINUED] DULoxetine (CYMBALTA) 60 MG capsule Take 1  capsule (60 mg total) by mouth daily.   [DISCONTINUED] Vitamin D, Ergocalciferol, (DRISDOL) 1.25 MG (50000 UNIT) CAPS capsule Take 1 capsule (50,000 Units total) by mouth every 7 (seven) days. Take for 8 total doses(weeks)   No facility-administered encounter medications on file as of 08/30/2022.   Past Surgical History:  Procedure Laterality Date   BREAST BIOPSY Right 2019   benign fibroadenoma and currently asymptomatic   TONSILLECTOMY AND ADENOIDECTOMY  2012   WISDOM TOOTH EXTRACTION Bilateral 2011    Virtual Visit via MyChart Video:   I connected with Emi Belfast on 08/30/22 via MyChart Video and verified that I am speaking with the correct person using appropriate identifiers.   The limitations, risks, security and privacy concerns of performing an evaluation and management service by MyChart Video, including the higher likelihood of inaccurate diagnoses and treatments, and the availability of in person appointments were reviewed. The possible need of an additional face-to-face encounter for complete and high quality delivery of care was discussed. The patient was also made aware that there may be a patient responsible charge related to this service. The patient expressed understanding and wishes to proceed.  Provider location is in medical facility. Patient location is at their home, different from provider location. People involved in care of the patient during this telehealth encounter were myself, my nurse/medical assistant, and my front office/scheduling team member.  Objective findings:   General: Speaking full sentences, no audible heavy breathing. Sounds alert and appropriately interactive. Well-appearing. Face symmetric. Extraocular movements intact. Pupils equal and  round. No nasal flaring or accessory muscle use visualized.  Independent interpretation of notes and tests performed by another provider:   None  Pertinent History, Exam, Impression, and Recommendations:    Patellofemoral maltracking Chronic, overall well-controlled with intermittent bouts of pain primarily attributed to weather change.  We discussed treatment strategies and she is amenable to the following plan:  - Dose Celebrex twice daily as needed (take with food) for breakthrough knee pain - If noting persistent pain despite the Celebrex, contact our office for next steps - Can consider advanced imaging, intra-articular corticosteroid injection, bracing, formal PT as treatment options  Breast mass in female Patient brings up roughly 69-month history of left breast pain and mass, went to general surgeon, and since with breast specialist at [facility] for this issue.  Of note, has history of right breast core biopsy confirming fibroadenoma in 2019.  Patient has additional upcoming imaging and follow-up, we will follow peripherally on this issue and can coordinate local imaging if indicated.  Anxiety and depression Doing very well, tolerating medication without issues.  Plan to continue current course.  Orders & Medications Meds ordered this encounter  Medications   celecoxib (CELEBREX) 100 MG capsule    Sig: Take 1 capsule (100 mg total) by mouth 2 (two) times daily as needed.    Dispense:  60 capsule    Refill:  2   DULoxetine (CYMBALTA) 60 MG capsule    Sig: Take 1 capsule (60 mg total) by mouth daily.    Dispense:  90 capsule    Refill:  1   No orders of the defined types were placed in this encounter.    I discussed the above assessment and treatment plan with the patient. The patient was provided an opportunity to ask questions and all were answered. The patient agreed with the plan and demonstrated an understanding of the instructions.   The patient was advised to call back or seek an in-person evaluation if the symptoms worsen or if the condition fails to improve as anticipated.   I provided a total time of 30 minutes including both face-to-face and non-face-to-face time on  08/30/2022 inclusive of time utilized for medical chart review, information gathering, care coordination with staff, and documentation completion.    [name], MD, [facility]   Primary Care Sports Medicine Primary Care and Sports Medicine at [facility]

## 2022-08-30 NOTE — Assessment & Plan Note (Signed)
Patient brings up roughly 33-month history of left breast pain and mass, went to general surgeon, and since with breast specialist at [facility] for this issue.  Of note, has history of right breast core biopsy confirming fibroadenoma in 2019.  Patient has additional upcoming imaging and follow-up, we will follow peripherally on this issue and can coordinate local imaging if indicated.

## 2022-08-30 NOTE — Assessment & Plan Note (Signed)
Chronic, overall well-controlled with intermittent bouts of pain primarily attributed to weather change.  We discussed treatment strategies and she is amenable to the following plan:  - Dose Celebrex twice daily as needed (take with food) for breakthrough knee pain - If noting persistent pain despite the Celebrex, contact our office for next steps - Can consider advanced imaging, intra-articular corticosteroid injection, bracing, formal PT as treatment options

## 2022-08-30 NOTE — Patient Instructions (Signed)
-   Dose Celebrex twice daily as needed (take with food) for breakthrough knee pain - If noting persistent pain despite the Celebrex, contact our office for next steps - Maintain follow-up with breast specialist and contact us for any coordination of care needs - Return for annual physical

## 2022-09-25 NOTE — Progress Notes (Unsigned)
   No chief complaint on file.    History of Present Illness:  Jeanette Miranda is a 32 y.o. that had a {IUD:23561} IUD placed 7/21. Since that time, she denies dyspareunia, pelvic pain, non-menstrual bleeding, vaginal d/c, heavy bleeding.  Breast pain???  There were no vitals taken for this visit.  Pelvic exam:  Two IUD strings {DESC; PRESENT/ABSENT:17923} seen coming from the cervical os. EGBUS, vaginal vault and cervix: within normal limits  IUD Removal Strings of IUD identified and grasped.  IUD removed without problem with ring forceps.  Pt tolerated this well.  IUD noted to be intact.  Assessment:  No diagnosis found.    Plan: IUD removed and plan for contraception is {PLAN CONTRACEPTION:313102}. She was amenable to this plan.  Spirit Wernli B. Zamiah Tollett, PA-C 09/25/2022 9:38 PM

## 2022-09-26 ENCOUNTER — Ambulatory Visit (INDEPENDENT_AMBULATORY_CARE_PROVIDER_SITE_OTHER): Payer: Medicaid Other | Admitting: Obstetrics and Gynecology

## 2022-09-26 ENCOUNTER — Encounter: Payer: Self-pay | Admitting: Obstetrics and Gynecology

## 2022-09-26 VITALS — BP 110/70 | Ht 61.0 in | Wt 120.0 lb

## 2022-09-26 DIAGNOSIS — Z30432 Encounter for removal of intrauterine contraceptive device: Secondary | ICD-10-CM

## 2022-09-26 NOTE — Patient Instructions (Signed)
I value your feedback and you entrusting us with your care. If you get a [facility] patient survey, I would appreciate you taking the time to let us know about your experience today. Thank you! ? ? ?

## 2023-01-17 ENCOUNTER — Ambulatory Visit: Payer: Medicaid Other

## 2023-01-17 VITALS — BP 99/71 | HR 66 | Wt 122.4 lb

## 2023-01-17 DIAGNOSIS — R3915 Urgency of urination: Secondary | ICD-10-CM | POA: Diagnosis not present

## 2023-01-17 LAB — POCT URINALYSIS DIPSTICK
Bilirubin, UA: NEGATIVE
Blood, UA: NEGATIVE
Glucose, UA: NEGATIVE
Ketones, UA: NEGATIVE
Leukocytes, UA: NEGATIVE
Nitrite, UA: NEGATIVE
Protein, UA: NEGATIVE
Spec Grav, UA: 1.015 (ref 1.010–1.025)
Urobilinogen, UA: 0.2 U/dL
pH, UA: 7 (ref 5.0–8.0)

## 2023-01-17 NOTE — Patient Instructions (Signed)
Urinary Frequency, Adult Urinary frequency means urinating more often than usual. You may urinate every 1-2 hours even though you drink a normal amount of fluid and do not have a bladder infection or condition. Although you urinate more often than normal, the total amount of urine produced in a day is normal. With urinary frequency, you may have an urgent need to urinate often. The stress and anxiety of needing to find a bathroom quickly can make this urge worse. This condition may go away on its own, or you may need treatment at home. Home treatment may include bladder training, exercises, taking medicines, or making changes to your diet. Follow these instructions at home: Bladder health Your health care provider will tell you what to do to improve bladder health. You may be told to: Keep a bladder diary. Keep track of: What you eat and drink. How often you urinate. How much you urinate. Follow a bladder training program. This may include: Learning to delay going to the bathroom. Double urinating, also called voiding. This helps if you are not completely emptying your bladder. Scheduled voiding. Do Kegel exercises. Kegel exercises strengthen the muscles that help control urination, which may help the condition.  Eating and drinking Follow instructions from your health care provider about eating or drinking restrictions. You may be told to: Avoid caffeine. Drink fewer fluids, especially alcohol. Avoid drinking in the evening. Avoid foods or drinks that may irritate the bladder. These include coffee, tea, soda, artificial sweeteners, citrus, tomato-based foods, and chocolate. Eat foods that help prevent or treat constipation. Constipation can make urinary frequency worse. You may need to take these actions to prevent or treat constipation: Drink enough fluid to keep your urine pale yellow. Take over-the-counter or prescription medicines. Eat foods that are high in fiber, such as beans, whole  grains, and fresh fruits and vegetables. Limit foods that are high in fat and processed sugars, such as fried or sweet foods. General instructions Take over-the-counter and prescription medicines only as told by your health care provider. Keep all follow-up visits. This is important. Contact a health care provider if: You start urinating more often. You feel pain or irritation when you urinate. You notice blood in your urine. Your urine looks cloudy. You develop a fever. You begin vomiting. Get help right away if: You are unable to urinate. Summary Urinary frequency means urinating more often than usual. With urinary frequency, you may urinate every 1-2 hours even though you drink a normal amount of fluid and do not have a bladder infection or other bladder condition. Your health care provider may recommend that you keep a bladder diary, follow a bladder training program, or make dietary changes. If told by your health care provider, do Kegel exercises to strengthen the muscles that help control urination. Take over-the-counter and prescription medicines only as told by your health care provider. Contact a health care provider if your symptoms do not improve or get worse. This information is not intended to replace advice given to you by your health care provider. Make sure you discuss any questions you have with your health care provider. Document Revised: 08/20/2019 Document Reviewed: 08/20/2019 Elsevier Patient Education  2024 [employer]vier Inc.  

## 2023-01-17 NOTE — Progress Notes (Signed)
    NURSE VISIT NOTE  Subjective:    Patient ID: Jeanette Miranda, female    DOB: 11-30-1990, 32 y.o.   MRN: [id number]       HPI  Patient is a 32 y.o. G11P1102 female who presents for urinary frequency and urinary urgency for 4 days.  Patient denies dysuria, hematuria, genital rash, genital irritation, and vaginal discharge.  Patient does not have a history of recurrent UTI.  Patient does not have a history of pyelonephritis.    Objective:    BP 99/71   Pulse 66   Wt 122 lb 6.4 oz (55.5 kg)   LMP 01/03/2023 (Exact Date)   BMI 23.13 kg/m    Lab Review  Results for orders placed or performed in visit on 01/17/23  POCT urinalysis dipstick  Result Value Ref Range   Color, UA yellow    Clarity, UA clear    Glucose, UA Negative Negative   Bilirubin, UA negative    Ketones, UA negative    Spec Grav, UA 1.015 1.010 - 1.025   Blood, UA negative    pH, UA 7.0 5.0 - 8.0   Protein, UA Negative Negative   Urobilinogen, UA 0.2 0.2 or 1.0 E.U./dL   Nitrite, UA negative    Leukocytes, UA Negative Negative   Appearance     Odor      Assessment:   1. Urinary urgency      Plan:   Urine Culture Sent. Maintain adequate hydration.  May use AZO OTC prn.  Follow up if symptoms worsen or fail to improve as anticipated, and as needed.    Fonda Kinder, CMA

## 2023-01-20 LAB — URINE CULTURE

## 2023-01-30 ENCOUNTER — Ambulatory Visit (INDEPENDENT_AMBULATORY_CARE_PROVIDER_SITE_OTHER): Payer: Medicaid Other | Admitting: Family Medicine

## 2023-01-30 ENCOUNTER — Ambulatory Visit: Payer: Medicaid Other | Admitting: Family Medicine

## 2023-01-30 VITALS — BP 100/76 | HR 67 | Ht 61.0 in | Wt 119.0 lb

## 2023-01-30 DIAGNOSIS — J0100 Acute maxillary sinusitis, unspecified: Secondary | ICD-10-CM | POA: Diagnosis not present

## 2023-01-30 MED ORDER — AZITHROMYCIN 250 MG PO TABS: ORAL_TABLET | ORAL | 0 refills | Status: AC

## 2023-01-30 NOTE — Assessment & Plan Note (Signed)
 History of Present Illness Clariza presents with symptoms of an upper respiratory infection that has been ongoing for approximately eleven days. She initially experienced a severe sore throat, which has since resolved. She has been congested for about a week, and for the past two to three days, she has been experiencing ear issues, primarily on the left side. She describes the sensation as intense sinus pressure on the left side, which has now extended to her left ear, causing discomfort when sleeping. She also reports that her sinuses seem to 'pop' when she tries to sleep on her right side. She has tried over-the-counter Sudafed for a few days, but discontinued it due to nosebleeds. She has been managing her symptoms with Tylenol  and heating pads. She denies any fever or chills since the initial onset of her illness, and her respiratory symptoms have largely resolved. She has a minimal cough, which she attributes to occasional difficulty swallowing. Despite her efforts with steam showers and blowing her nose, her symptoms have not improved.  Physical Exam VITALS: SaO2- 100% HEENT: Right tympanic membrane and canal appear benign. Left tympanic membrane appears dull, partially visualized due to dark cerumen obstruction, canal appears benign. Left maxillary sinus tenderness upon palpation. Oropharynx exhibits mild erythema with a cobblestone pattern. Nasopharynx exhibits erythema and minimal bilateral turbinate swelling. CHEST: Lungs clear to auscultation bilaterally, no wheezes or crackles noted. CARDIOVASCULAR: S1 and S2 are regular rate and rhythm, no additional heart sounds noted.  Assessment and Plan Sinusitis -acute left maxillary 11-day history of upper respiratory symptoms, initially with sore throat and cough, now with left-sided sinus pressure, nasal congestion, and ear discomfort. No fever. Left maxillary sinus tenderness on exam. Left tympanic membrane appears dull, possibly due to retraction  from sinus pressure. -Start Azithromycin  (Z-Pak) for suspected bacterial sinusitis. -Advise to purchase over-the-counter Mucinex (guaifenesin) to thin secretions and promote drainage. -Continue use of heating pads for symptomatic relief. -Consider use of Flonase  (fluticasone ) nasal spray if available at home. -Message provider if symptoms do not improve or if yeast infection develops (common with antibiotic use).

## 2023-01-30 NOTE — Patient Instructions (Signed)
 Patient Plan:  Sinusitis:  - Start Azithromycin  (Z-Pak). - Use Mucinex (guaifenesin) for mucus thinning. - Apply heating pads for relief. - Consider Flonase  if available. - Contact us  if no improvement or yeast infection occurs.  General Health Maintenance: - Stay hydrated and rest. - Consider vitamin D  supplements.

## 2023-01-30 NOTE — Progress Notes (Signed)
 Primary Care / Sports Medicine Office Visit  Patient Information:  Patient ID: Jeanette Miranda, female DOB: 24-Sep-1990 Age: 33 y.o. MRN: [id number]   Jeanette Miranda is a pleasant 33 y.o. female presenting with the following:  Chief Complaint  Patient presents with   Sinus Problem    Congestion x 1 week, been sick for about 11 days, ears feel full L>R 2-3 days.     Vitals:   01/30/23 1324  BP: 100/76  Pulse: 67  SpO2: 100%   Vitals:   01/30/23 1324  Weight: 119 lb (54 kg)  Height: 5' 1 (1.549 m)   Body mass index is 22.48 kg/m.  No results found.   Independent interpretation of notes and tests performed by another provider:   None  Procedures performed:   None  Pertinent History, Exam, Impression, and Recommendations:   Problem List Items Addressed This Visit       Respiratory   Acute non-recurrent maxillary sinusitis - Primary   History of Present Illness [name] presents with symptoms of an upper respiratory infection that has been ongoing for approximately eleven days. She initially experienced a severe sore throat, which has since resolved. She has been congested for about a week, and for the past two to three days, she has been experiencing ear issues, primarily on the left side. She describes the sensation as intense sinus pressure on the left side, which has now extended to her left ear, causing discomfort when sleeping. She also reports that her sinuses seem to 'pop' when she tries to sleep on her right side. She has tried over-the-counter Sudafed for a few days, but discontinued it due to nosebleeds. She has been managing her symptoms with Tylenol  and heating pads. She denies any fever or chills since the initial onset of her illness, and her respiratory symptoms have largely resolved. She has a minimal cough, which she attributes to occasional difficulty swallowing. Despite her efforts with steam showers and blowing her nose, her symptoms have not  improved.  Physical Exam VITALS: SaO2- 100% HEENT: Right tympanic membrane and canal appear benign. Left tympanic membrane appears dull, partially visualized due to dark cerumen obstruction, canal appears benign. Left maxillary sinus tenderness upon palpation. Oropharynx exhibits mild erythema with a cobblestone pattern. Nasopharynx exhibits erythema and minimal bilateral turbinate swelling. CHEST: Lungs clear to auscultation bilaterally, no wheezes or crackles noted. CARDIOVASCULAR: S1 and S2 are regular rate and rhythm, no additional heart sounds noted.  Assessment and Plan Sinusitis -acute left maxillary 11-day history of upper respiratory symptoms, initially with sore throat and cough, now with left-sided sinus pressure, nasal congestion, and ear discomfort. No fever. Left maxillary sinus tenderness on exam. Left tympanic membrane appears dull, possibly due to retraction from sinus pressure. -Start Azithromycin  (Z-Pak) for suspected bacterial sinusitis. -Advise to purchase over-the-counter Mucinex (guaifenesin) to thin secretions and promote drainage. -Continue use of heating pads for symptomatic relief. -Consider use of Flonase  (fluticasone ) nasal spray if available at home. -Message provider if symptoms do not improve or if yeast infection develops (common with antibiotic use).      Relevant Medications   azithromycin  (ZITHROMAX ) 250 MG tablet     Orders & Medications Medications:  Meds ordered this encounter  Medications   azithromycin  (ZITHROMAX ) 250 MG tablet    Sig: Take 2 tablets on day 1, then 1 tablet daily on days 2 through 5    Dispense:  6 tablet    Refill:  0   No  orders of the defined types were placed in this encounter.    No follow-ups on file.     Selinda JINNY Ku, MD, [facility]   Primary Care Sports Medicine Primary Care and Sports Medicine at [facility]

## 2023-05-05 ENCOUNTER — Encounter: Payer: Self-pay | Admitting: Family Medicine

## 2023-05-13 ENCOUNTER — Other Ambulatory Visit: Payer: Self-pay | Admitting: Family Medicine

## 2023-05-13 DIAGNOSIS — Z1231 Encounter for screening mammogram for malignant neoplasm of breast: Secondary | ICD-10-CM

## 2023-05-23 ENCOUNTER — Other Ambulatory Visit: Payer: Self-pay | Admitting: Family Medicine

## 2023-05-23 DIAGNOSIS — F419 Anxiety disorder, unspecified: Secondary | ICD-10-CM

## 2023-05-23 NOTE — Telephone Encounter (Signed)
 Requested medications are due for refill today.  yes  Requested medications are on the active medications list.  yes  Last refill. 08/30/2022 #90 1 rf  Future visit scheduled.   yes  Notes to clinic.  Pt is overdue for OV. Pt has cancelled the last 2 ov appts.    Requested Prescriptions  Pending Prescriptions Disp Refills   DULoxetine  (CYMBALTA ) 60 MG capsule [Pharmacy Med Name: DULOXETINE  HCL DR 60 MG CAP] 90 capsule 1    Sig: TAKE 1 CAPSULE BY MOUTH EVERY DAY     Psychiatry: Antidepressants - SNRI - duloxetine  Failed - 05/23/2023  1:47 PM      Failed - Cr in normal range and within 360 days    Creatinine  Date Value Ref Range Status  10/29/2011 0.61 0.60 - 1.30 mg/dL Final   Creatinine, Ser  Date Value Ref Range Status  05/02/2022 0.64 0.57 - 1.00 mg/dL Final         Failed - eGFR is 30 or above and within 360 days    EGFR (African American)  Date Value Ref Range Status  10/29/2011 >60  Final   GFR calc Af Amer  Date Value Ref Range Status  11/13/2018 >60 >60 mL/min Final   EGFR (Non-African Amer.)  Date Value Ref Range Status  10/29/2011 >60  Final    Comment:    eGFR values <54mL/min/1.73 m2 may be an indication of chronic kidney disease (CKD). Calculated eGFR is useful in patients with stable renal function. The eGFR calculation will not be reliable in acutely ill patients when serum creatinine is changing rapidly. It is not useful in  patients on dialysis. The eGFR calculation may not be applicable to patients at the low and high extremes of body sizes, pregnant women, and vegetarians.    GFR calc non Af Amer  Date Value Ref Range Status  11/13/2018 >60 >60 mL/min Final   eGFR  Date Value Ref Range Status  05/02/2022 122 >59 mL/min/1.73 Final         Failed - Valid encounter within last 6 months    Recent Outpatient Visits   None     Future Appointments             In 3 months Ma Saupe, MD [facility] Primary [facility] & Sports Medicine at  [facility]            Passed - Completed PHQ-2 or PHQ-9 in the last 360 days      Passed - Last BP in normal range    BP Readings from Last 1 Encounters:  01/30/23 100/76

## 2023-06-30 ENCOUNTER — Ambulatory Visit
Admission: RE | Admit: 2023-06-30 | Discharge: 2023-06-30 | Disposition: A | Discharge status: Home / Self Care | Source: Ambulatory Visit | Attending: Family Medicine | Admitting: Family Medicine

## 2023-06-30 DIAGNOSIS — Z1231 Encounter for screening mammogram for malignant neoplasm of breast: Secondary | ICD-10-CM | POA: Diagnosis present

## 2023-07-01 ENCOUNTER — Encounter: Payer: Self-pay | Admitting: Obstetrics and Gynecology

## 2023-07-01 ENCOUNTER — Other Ambulatory Visit: Payer: Self-pay | Admitting: Obstetrics and Gynecology

## 2023-07-01 MED ORDER — MICROGESTIN 24 FE 1-20 MG-MCG PO TABS: 1.0000 | ORAL_TABLET | Freq: Every day | ORAL | 0 refills | Status: DC

## 2023-07-01 NOTE — Progress Notes (Signed)
 Rx OCPs for [facility]. RTO for annual.

## 2023-07-16 ENCOUNTER — Encounter: Payer: Self-pay | Admitting: Family Medicine

## 2023-07-17 ENCOUNTER — Ambulatory Visit (INDEPENDENT_AMBULATORY_CARE_PROVIDER_SITE_OTHER): Admitting: Family Medicine

## 2023-07-17 ENCOUNTER — Encounter: Payer: Self-pay | Admitting: Family Medicine

## 2023-07-17 VITALS — BP 94/68 | HR 94 | Ht 61.0 in | Wt 112.0 lb

## 2023-07-17 DIAGNOSIS — F32A Depression, unspecified: Secondary | ICD-10-CM

## 2023-07-17 DIAGNOSIS — J309 Allergic rhinitis, unspecified: Secondary | ICD-10-CM

## 2023-07-17 DIAGNOSIS — F419 Anxiety disorder, unspecified: Secondary | ICD-10-CM | POA: Diagnosis not present

## 2023-07-17 MED ORDER — DULOXETINE HCL 30 MG PO CPEP: 90.0000 mg | ORAL_CAPSULE | Freq: Every day | ORAL | 3 refills | Status: DC

## 2023-07-17 NOTE — Progress Notes (Signed)
     Primary Care / Sports Medicine Office Visit  Patient Information:  Patient ID: Jeanette Miranda, female DOB: August 08, 1990 Age: 33 y.o. MRN: [id number]   Jeanette Miranda is a pleasant 33 y.o. female presenting with the following:  Chief Complaint  Patient presents with   Ear Pain    Patient having ear pain x 2 day with fullness. Patient has taken tylenol  for pain. No other treatment.     Vitals:   07/17/23 1058  BP: 94/68  Pulse: 94  SpO2: 96%   Vitals:   07/17/23 1058  Weight: 112 lb (50.8 kg)  Height: 5' 1 (1.549 m)   Body mass index is 21.16 kg/m.     Independent interpretation of notes and tests performed by another provider:   None  Procedures performed:   None  Pertinent History, Exam, Impression, and Recommendations:   Problem List Items Addressed This Visit     Allergic rhinitis   She has been experiencing ear pain that began yesterday morning, described as severe at times and fluctuating throughout the day. Today, she feels a sensation of fullness in the ear, describing it as 'stomped up'. No facial pressure, throat pain, or significant congestion is noted. She mentions feeling feverish yesterday without an actual fever. She also reports a headache and some congestion in the back of her throat.  Allergic rhinitis with Eustachian tube dysfunction and sinus congestion Symptoms are consistent with allergic rhinitis, including nasal congestion, mild sinus tenderness, and fluid behind the eardrums causing intermittent ear pain and pressure. No current infection, but fluid buildup increases risk of secondary infection. - Intranasal steroid (Flonase) two sprays each nostril daily for seven days. - Antihistamine (Claritin  or Zyrtec) daily for seven days, avoid Benadryl . - Optional: Mucinex 12-hour twice daily for seven days. - Contact provider if no improvement or worsening by next Thursday or Friday for potential antibiotics. - If patient contacts back after  7+ days with persistent symptoms despite adherence to shared plan, anticipate empiric antibiotics to be eRx.       Anxiety and depression - Primary   Socially, she is separated from her husband and is starting a new job. She is currently in therapy, although her therapist is on maternity leave.  Anxiety and Depression Managed with Cymbalta . Experiencing stressors; current dose may be insufficient for mood stabilization. - Increase Cymbalta  to 90 mg daily (three 30 mg capsules). - Discuss changes with therapist when available. - Monitor mood and report lack of improvement or worsening symptoms at follow-up visit.      Relevant Medications   DULoxetine  (CYMBALTA ) 30 MG capsule     Orders & Medications Medications:  Meds ordered this encounter  Medications   DULoxetine  (CYMBALTA ) 30 MG capsule    Sig: Take 3 capsules (90 mg total) by mouth daily.    Dispense:  90 capsule    Refill:  3   No orders of the defined types were placed in this encounter.    No follow-ups on file.     Ma Saupe, MD, [facility]   Primary Care Sports Medicine Primary Care and Sports Medicine at [facility]

## 2023-07-17 NOTE — Patient Instructions (Signed)
 Patient Action Plan  1. Allergic Rhinitis and Eustachian Tube Dysfunction:    - Use Flonase: two sprays in each nostril daily for seven days.    - Take an antihistamine like Claritin  or Zyrtec daily for seven days; avoid Benadryl .    - Optionally, you can take Mucinex 12-hour twice daily for seven days.    - Contact your healthcare provider if there is no improvement or if symptoms worsen by next Thursday or Friday. Antibiotics may be needed if symptoms persist after seven days despite following this plan.  2. Anxiety and Depression Management:    - Increase Cymbalta  to 90 mg daily (take three 30 mg capsules).    - Discuss any medication changes with your therapist when they are available.    - Monitor your mood and report any lack of improvement or worsening symptoms at your next follow-up visit.  Red Flags: - If you experience any new or severe symptoms, contact your healthcare provider immediately.

## 2023-07-17 NOTE — Assessment & Plan Note (Signed)
 Socially, she is separated from her husband and is starting a new job. She is currently in therapy, although her therapist is on maternity leave.  Anxiety and Depression Managed with Cymbalta . Experiencing stressors; current dose may be insufficient for mood stabilization. - Increase Cymbalta  to 90 mg daily (three 30 mg capsules). - Discuss changes with therapist when available. - Monitor mood and report lack of improvement or worsening symptoms at follow-up visit.

## 2023-07-17 NOTE — Assessment & Plan Note (Signed)
 She has been experiencing ear pain that began yesterday morning, described as severe at times and fluctuating throughout the day. Today, she feels a sensation of fullness in the ear, describing it as 'stomped up'. No facial pressure, throat pain, or significant congestion is noted. She mentions feeling feverish yesterday without an actual fever. She also reports a headache and some congestion in the back of her throat.  Allergic rhinitis with Eustachian tube dysfunction and sinus congestion Symptoms are consistent with allergic rhinitis, including nasal congestion, mild sinus tenderness, and fluid behind the eardrums causing intermittent ear pain and pressure. No current infection, but fluid buildup increases risk of secondary infection. - Intranasal steroid (Flonase) two sprays each nostril daily for seven days. - Antihistamine (Claritin  or Zyrtec) daily for seven days, avoid Benadryl . - Optional: Mucinex 12-hour twice daily for seven days. - Contact provider if no improvement or worsening by next Thursday or Friday for potential antibiotics. - If patient contacts back after 7+ days with persistent symptoms despite adherence to shared plan, anticipate empiric antibiotics to be eRx.

## 2023-07-29 ENCOUNTER — Encounter: Payer: Self-pay | Admitting: Family Medicine

## 2023-07-29 NOTE — Telephone Encounter (Signed)
 Please review and advise patient.   JM

## 2023-08-08 ENCOUNTER — Other Ambulatory Visit: Payer: Self-pay | Admitting: Family Medicine

## 2023-08-11 NOTE — Telephone Encounter (Signed)
 Requested Prescriptions  Refused Prescriptions Disp Refills   DULoxetine  (CYMBALTA ) 30 MG capsule [Pharmacy Med Name: DULOXETINE  HCL DR 30 MG CAP] 270 capsule 2    Sig: TAKE 3 CAPSULES BY MOUTH EVERY DAY     Psychiatry: Antidepressants - SNRI - duloxetine  Failed - 08/11/2023  2:01 PM      Failed - Cr in normal range and within 360 days    Creatinine  Date Value Ref Range Status  10/29/2011 0.61 0.60 - 1.30 mg/dL Final   Creatinine, Ser  Date Value Ref Range Status  05/02/2022 0.64 0.57 - 1.00 mg/dL Final         Failed - eGFR is 30 or above and within 360 days    EGFR (African American)  Date Value Ref Range Status  10/29/2011 >60  Final   GFR calc Af Amer  Date Value Ref Range Status  11/13/2018 >60 >60 mL/min Final   EGFR (Non-African Amer.)  Date Value Ref Range Status  10/29/2011 >60  Final    Comment:    eGFR values <48mL/min/1.73 m2 may be an indication of chronic kidney disease (CKD). Calculated eGFR is useful in patients with stable renal function. The eGFR calculation will not be reliable in acutely ill patients when serum creatinine is changing rapidly. It is not useful in  patients on dialysis. The eGFR calculation may not be applicable to patients at the low and high extremes of body sizes, pregnant women, and vegetarians.    GFR calc non Af Amer  Date Value Ref Range Status  11/13/2018 >60 >60 mL/min Final   eGFR  Date Value Ref Range Status  05/02/2022 122 >59 mL/min/1.73 Final         Failed - Valid encounter within last 6 months    Recent Outpatient Visits           3 weeks ago Anxiety and depression   [place] [facility] & [facility] Medicine at [facility], Selinda PARAS, MD       Future Appointments             In 3 weeks Ku Selinda PARAS, MD [facility] Primary Care & Sports Medicine at [facility]            Passed - Completed PHQ-2 or PHQ-9 in the last 360 days      Passed - Last BP in normal range    BP  Readings from Last 1 Encounters:  07/17/23 94/68

## 2023-09-03 ENCOUNTER — Encounter: Payer: Self-pay | Admitting: Family Medicine

## 2023-09-03 ENCOUNTER — Ambulatory Visit (INDEPENDENT_AMBULATORY_CARE_PROVIDER_SITE_OTHER): Admitting: Family Medicine

## 2023-09-03 VITALS — BP 110/60 | Ht 61.0 in | Wt 118.0 lb

## 2023-09-03 DIAGNOSIS — Z0000 Encounter for general adult medical examination without abnormal findings: Secondary | ICD-10-CM

## 2023-09-03 DIAGNOSIS — R7309 Other abnormal glucose: Secondary | ICD-10-CM

## 2023-09-03 DIAGNOSIS — J4520 Mild intermittent asthma, uncomplicated: Secondary | ICD-10-CM

## 2023-09-03 DIAGNOSIS — M25819 Other specified joint disorders, unspecified shoulder: Secondary | ICD-10-CM | POA: Diagnosis not present

## 2023-09-03 DIAGNOSIS — Z136 Encounter for screening for cardiovascular disorders: Secondary | ICD-10-CM

## 2023-09-03 MED ORDER — MELOXICAM 7.5 MG PO TABS: 7.5000 mg | ORAL_TABLET | Freq: Every day | ORAL | 0 refills | Status: DC

## 2023-09-03 NOTE — Progress Notes (Signed)
 Annual Physical Exam Visit  Patient Information:  Patient ID: Jeanette Miranda, female DOB: 11-05-1990 Age: 33 y.o. MRN: [id number]   Subjective:   CC: Annual Physical Exam  HPI:  Jeanette Miranda is here for their annual physical.  I reviewed the past medical history, family history, social history, surgical history, and allergies today and changes were made as necessary.  Please see the problem list section below for additional details.  Past Medical History: Past Medical History:  Diagnosis Date   Allergy    Asthma    GERD (gastroesophageal reflux disease)    Hyperemesis 11/14/2018   UTI (urinary tract infection)    Past Surgical History: Past Surgical History:  Procedure Laterality Date   BREAST BIOPSY Right 2019   benign fibroadenoma and currently asymptomatic   TONSILLECTOMY AND ADENOIDECTOMY  2012   WISDOM TOOTH EXTRACTION Bilateral 2011   Family History: Family History  Problem Relation Age of Onset   Crohn's disease Mother    Asthma Mother    Depression Mother    Early death Mother    Anxiety disorder Mother    Arthritis Father    Depression Father    Anxiety disorder Father    Glaucoma Father    Asthma Brother    Heart disease Maternal Grandfather    Arthritis Paternal Grandmother    Depression Paternal Grandmother    Mental retardation Paternal Grandmother    Heart disease Paternal Grandfather        Additionally heart disease in great-grandfasther and great-great-grandfather   Arthritis Paternal Grandfather    Allergies: Allergies  Allergen Reactions   Reglan  [Metoclopramide ] Other (See Comments)    Hot flash, unpleasant feeling    Health Maintenance: Health Maintenance  Topic Date Due   Cervical Cancer Screening (HPV/Pap Cotest)  01/31/2023   INFLUENZA VACCINE  04/27/2024 (Originally 08/29/2023)   Pneumococcal Vaccine: 19-49 Years (1 of 2 - PCV) 09/02/2024 (Originally 12/28/2010)   Hepatitis B Vaccines (1 of 3 - 19+ 3-dose series)  09/02/2024 (Originally 12/28/2010)   HPV VACCINES (1 - 3-dose SCDM series) 09/02/2024 (Originally 12/28/2018)   DTaP/Tdap/Td (2 - Td or Tdap) 04/19/2029   Hepatitis C Screening  Completed   HIV Screening  Completed   Meningococcal B Vaccine  Aged Out   COVID-19 Vaccine  Discontinued    HM Colonoscopy   This patient has no relevant Health Maintenance data.    Medications: Current Outpatient Medications on File Prior to Visit  Medication Sig Dispense Refill   albuterol  (VENTOLIN  HFA) 108 (90 Base) MCG/ACT inhaler Inhale 1-2 puffs into the lungs every 6 (six) hours as needed for wheezing or shortness of breath. 6.7 g 2   DULoxetine  (CYMBALTA ) 30 MG capsule Take 3 capsules (90 mg total) by mouth daily. 90 capsule 3   Norethindrone  Acetate-Ethinyl Estrad-FE (MICROGESTIN  24 FE) 1-20 MG-MCG(24) tablet Take 1 tablet by mouth daily. 84 tablet 0   SODIUM FLUORIDE 5000 PPM 1.1 % PSTE USE 3 TIMES PER DAY     No current facility-administered medications on file prior to visit.    Objective:   Vitals:   09/03/23 1054  BP: 110/60   Vitals:   09/03/23 1031  Weight: 118 lb (53.5 kg)  Height: 5' 1 (1.549 m)   Body mass index is 22.3 kg/m.  General: Well Developed, well nourished, and in no acute distress.  Neuro: Alert and oriented x3, extra-ocular muscles intact, sensation grossly intact. Cranial nerves II through XII are grossly intact, motor,  sensory, and coordinative functions are intact. HEENT: Normocephalic, atraumatic, neck supple, no masses, no lymphadenopathy, thyroid  nonenlarged. Oropharynx, nasopharynx, external ear canals are unremarkable. Skin: Warm and dry, no rashes noted.  Cardiac: Regular rate and rhythm, no murmurs rubs or gallops. No peripheral edema. Pulses symmetric. Respiratory: Clear to auscultation bilaterally. Speaking in full sentences.  Abdominal: Soft, nontender, nondistended, positive bowel sounds, no masses, no organomegaly. Musculoskeletal: Stable, and with  full range of motion. Bilateral Shoulder Exam PALPATION: Non-tender bilaterally at the occipital groove, subacromial space, trapezius, and rhomboids RANGE OF MOTION (ROM) ASSESSMENT: Full symmetric forward flexion with minor discomfort at terminal flexion; symmetric abduction with right-sided discomfort at terminal range; full painless and symmetric internal and external rotation MUSCLE STRENGTH TESTING: 5/5 strength in internal rotation, painless; minor discomfort with isolated supraspinatus testing NEUROMUSCULAR: Grossly intact SPECIAL TESTS: - Right shoulder: Negative Neer's test with crepitus, positive Hawkins test, negative Speed's and Yergason's tests - Left shoulder: Negative Neer's and Hawkins tests, crepitus with manipulation, negative Speed's and Yergason's tests   Impression and Recommendations:   The patient was counselled, risk factors were discussed, and anticipatory guidance given.  Problem List Items Addressed This Visit     Asthma   Asthma Asthma well-controlled. No recent inhaler use.      Healthcare maintenance - Primary   Relevant Medications   meloxicam  (MOBIC ) 7.5 MG tablet   Other Relevant Orders   CBC   Shoulder impingement   Left shoulder mechanical symptoms - Frequent clicking in the left shoulder, described as 'pop, pop, pop' - Increased frequency of clicking since starting new job approximately one month ago - No associated pain, numbness, tingling, or radiation to the neck or arm - No disturbance of sleep due to shoulder symptoms - No pins and needles sensation - Symptoms localized to the left shoulder; patient is right-handed  Bilateral shoulder impingement syndrome Right > Left shoulder impingement with inflammation, exacerbated by repetitive activities. Supraspinatus muscle weakness contributes to impingement. - Prescribed low-dose anti-inflammatory medication once daily for 30 days. - Provided home exercise program focusing on shoulder  strengthening exercises 1-2 times per week. - Advised to monitor for increased pain or discomfort and report if symptoms persist post-medication. - Sent exercise instructions via MyChart. - Recommended obtaining a resistance band for exercises.      Other Visit Diagnoses       Screening for cardiovascular condition       Relevant Orders   Comprehensive metabolic panel with GFR   Lipid panel     Abnormal glucose       Relevant Orders   Hemoglobin A1c        Orders & Medications Medications:  Meds ordered this encounter  Medications   meloxicam  (MOBIC ) 7.5 MG tablet    Sig: Take 1 tablet (7.5 mg total) by mouth daily.    Dispense:  30 tablet    Refill:  0   Orders Placed This Encounter  Procedures   CBC   Comprehensive metabolic panel with GFR   Hemoglobin A1c   Lipid panel     No follow-ups on file.    Selinda JINNY Ku, MD, [facility]   Primary Care Sports Medicine Primary Care and Sports Medicine at [facility]

## 2023-09-03 NOTE — Assessment & Plan Note (Signed)
 Asthma Asthma well-controlled. No recent inhaler use.

## 2023-09-03 NOTE — Assessment & Plan Note (Signed)
 Left shoulder mechanical symptoms - Frequent clicking in the left shoulder, described as 'pop, pop, pop' - Increased frequency of clicking since starting new job approximately one month ago - No associated pain, numbness, tingling, or radiation to the neck or arm - No disturbance of sleep due to shoulder symptoms - No pins and needles sensation - Symptoms localized to the left shoulder; patient is right-handed  Bilateral shoulder impingement syndrome Right > Left shoulder impingement with inflammation, exacerbated by repetitive activities. Supraspinatus muscle weakness contributes to impingement. - Prescribed low-dose anti-inflammatory medication once daily for 30 days. - Provided home exercise program focusing on shoulder strengthening exercises 1-2 times per week. - Advised to monitor for increased pain or discomfort and report if symptoms persist post-medication. - Sent exercise instructions via MyChart. - Recommended obtaining a resistance band for exercises.

## 2023-09-03 NOTE — Patient Instructions (Signed)
-   Obtain fasting labs with orders provided (can have water or black coffee but otherwise no food or drink x 8 hours before labs) - Review information provided - Attend eye doctor annually, dentist every 6 months, work towards or maintain 30 minutes of moderate intensity physical activity at least 5 days per week, and consume a balanced diet - Return in 1 year for physical - Contact us  for any questions between now and then   Patient Action Plan  1. For asthma:    - No changes needed. Continue to monitor symptoms.  2. For shoulder symptoms:    - Take the prescribed low-dose anti-inflammatory medication once daily for 30 days.    - Follow the home exercise program for shoulder strengthening 1-2 times per week.    - Use a resistance band for the exercises as recommended.    - Check your MyChart for detailed exercise instructions.    - Watch for any increase in pain or discomfort. If symptoms do not improve after finishing the medication, report this to the office.  3. For general health:    - Annual lab work has been ordered. Follow up as needed.  Red Flags: If you experience any of the following, seek medical attention right away: - Sudden or severe shoulder pain - New numbness, tingling, or weakness in your arm or hand - Loss of movement in your shoulder or arm  If your symptoms get worse or you have new concerns, contact the office.

## 2023-09-04 LAB — COMPREHENSIVE METABOLIC PANEL WITH GFR
ALT: 9 IU/L (ref 0–32)
AST: 16 IU/L (ref 0–40)
Albumin: 4.1 g/dL (ref 3.9–4.9)
Alkaline Phosphatase: 44 IU/L (ref 44–121)
BUN/Creatinine Ratio: 18 (ref 9–23)
BUN: 12 mg/dL (ref 6–20)
Bilirubin Total: 0.4 mg/dL (ref 0.0–1.2)
CO2: 22 mmol/L (ref 20–29)
Calcium: 9.4 mg/dL (ref 8.7–10.2)
Chloride: 101 mmol/L (ref 96–106)
Creatinine, Ser: 0.67 mg/dL (ref 0.57–1.00)
Globulin, Total: 2.1 g/dL (ref 1.5–4.5)
Glucose: 80 mg/dL (ref 70–99)
Potassium: 4.2 mmol/L (ref 3.5–5.2)
Sodium: 141 mmol/L (ref 134–144)
Total Protein: 6.2 g/dL (ref 6.0–8.5)
eGFR: 120 mL/min/1.73 (ref >59)

## 2023-09-04 LAB — LIPID PANEL
Chol/HDL Ratio: 3 ratio (ref 0.0–4.4)
Cholesterol, Total: 146 mg/dL (ref 100–199)
HDL: 49 mg/dL (ref >39)
LDL Chol Calc (NIH): 84 mg/dL (ref 0–99)
Triglycerides: 62 mg/dL (ref 0–149)
VLDL Cholesterol Cal: 13 mg/dL (ref 5–40)

## 2023-09-04 LAB — CBC
Hematocrit: 41.7 % (ref 34.0–46.6)
Hemoglobin: 13.5 g/dL (ref 11.1–15.9)
MCH: 29.3 pg (ref 26.6–33.0)
MCHC: 32.4 g/dL (ref 31.5–35.7)
MCV: 91 fL (ref 79–97)
Platelets: 234 x10E3/uL (ref 150–450)
RBC: 4.6 x10E6/uL (ref 3.77–5.28)
RDW: 12.2 % (ref 11.7–15.4)
WBC: 7.5 x10E3/uL (ref 3.4–10.8)

## 2023-09-04 LAB — HEMOGLOBIN A1C
Est. average glucose Bld gHb Est-mCnc: 105 mg/dL
Hgb A1c MFr Bld: 5.3 % (ref 4.8–5.6)

## 2023-09-10 ENCOUNTER — Ambulatory Visit: Payer: Self-pay | Admitting: Family Medicine

## 2023-09-11 ENCOUNTER — Ambulatory Visit
Admission: RE | Admit: 2023-09-11 | Discharge: 2023-09-11 | Disposition: A | Discharge status: Home / Self Care | Source: Ambulatory Visit | Attending: Family Medicine | Admitting: Family Medicine

## 2023-09-11 VITALS — BP 99/74 | HR 75 | Temp 98.3°F | Resp 16 | Ht 61.0 in | Wt 118.0 lb

## 2023-09-11 DIAGNOSIS — J069 Acute upper respiratory infection, unspecified: Secondary | ICD-10-CM | POA: Insufficient documentation

## 2023-09-11 LAB — RESP PANEL BY RT-PCR (FLU A&B, COVID) ARPGX2
Influenza A by PCR: NEGATIVE
Influenza B by PCR: NEGATIVE
SARS Coronavirus 2 by RT PCR: NEGATIVE

## 2023-09-11 NOTE — ED Provider Notes (Signed)
 [facility]    CSN: [id number] Arrival date & time: 09/11/23  1331      History   Chief Complaint Chief Complaint  Patient presents with   Headache   Fatigue    HPI Jeanette Miranda is a 33 y.o. female.   HPI  History obtained from the patient. Jeanette Miranda presents for fatigue, nausea, cough, headache, sweating, rhinorrhea and scratchy throat that started yesterday.  She says she had to leave work as she kept throwing up in her mouth.  Denies fever, abdominal pain, diarrhea  and nasal congestion. No treatments prior to arrival.Her 4 yo child is also sick.    Needs a work note.        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   Preterm delivery, delivered 06/10/2019   Threatened preterm labor, third trimester 05/17/2019   Vaginal bleeding in pregnancy, third trimester 05/16/2019   Supervision of low-risk pregnancy 11/13/2018   Anxiety and depression 11/13/2018   Endometriosis 08/26/2018   Breast mass in female 03/07/2017    Past Surgical History:  Procedure Laterality Date   BREAST BIOPSY Right 2019   benign fibroadenoma and currently asymptomatic   TONSILLECTOMY AND ADENOIDECTOMY  2012   WISDOM TOOTH EXTRACTION Bilateral 2011    OB History     Gravida  2   Para  2   Term  1   Preterm  1   AB      Living  2      SAB      IAB      Ectopic      Multiple  0   Live Births  2         Obstetric Comments  1st Menstrual Cycle:  13 1st Pregnancy:  23          Home Medications    Prior to Admission medications   Medication Sig Start Date End Date Taking? Authorizing Provider  albuterol (VENTOLIN HFA) 108 (90 Base) MCG/ACT inhaler Inhale 1-2 puffs into the lungs every 6 (six) hours as needed for wheezing or shortness of breath. 03/19/21   Matthews, Jason J, MD  DULoxetine (CYMBALTA) 30 MG capsule Take 3 capsules (90 mg total) by mouth daily. 07/17/23   Matthews, Jason J, MD  meloxicam (MOBIC) 7.5 MG tablet Take 1 tablet (7.5 mg total) by mouth daily. 09/03/23   Matthews, Jason J, MD  Norethindrone Acetate-Ethinyl Estrad-FE (MICROGESTIN 24 FE) 1-20 MG-MCG(24) tablet Take 1 tablet by mouth daily. 07/01/23   Copland, Alicia B, PA-C  SODIUM FLUORIDE 5000 PPM 1.1 % PSTE USE 3 TIMES PER DAY 10/23/22   [provider]    Family History Family History  Problem Relation Age of Onset   Crohn's disease Mother    Asthma Mother    Depression Mother    Early death Mother    Anxiety disorder Mother    Arthritis Father    Depression Father    Anxiety  disorder Father    Glaucoma Father    Asthma Brother    Heart disease Maternal Grandfather    Arthritis Paternal Grandmother    Depression Paternal Grandmother    Mental retardation Paternal Grandmother    Heart disease Paternal Grandfather        Additionally heart disease in great-grandfasther and great-great-grandfather   Arthritis Paternal Grandfather     Social History Social History   Tobacco Use   Smoking status: Never    Passive exposure: Never   Smokeless tobacco: Never  Vaping Use   Vaping status: Never Used  Substance Use Topics   Alcohol use: Not Currently   Drug use: Never     Allergies   Reglan  [metoclopramide ]   Review of Systems Review of Systems: negative unless otherwise stated in HPI.      Physical Exam Triage Vital Signs ED Triage Vitals  Encounter Vitals Group     BP 09/11/23  1352 99/74     Girls Systolic BP Percentile --      Girls Diastolic BP Percentile --      Boys Systolic BP Percentile --      Boys Diastolic BP Percentile --      Pulse Rate 09/11/23 1352 75     Resp 09/11/23 1352 16     Temp 09/11/23 1352 98.3 F (36.8 C)     Temp Source 09/11/23 1352 Oral     SpO2 09/11/23 1352 100 %     Weight 09/11/23 1351 118 lb (53.5 kg)     Height 09/11/23 1351 5' 1 (1.549 m)     Head Circumference --      Peak Flow --      Pain Score 09/11/23 1401 3     Pain Loc --      Pain Education --      Exclude from Growth Chart --    No data found.  Updated Vital Signs BP 99/74 (BP Location: Left Arm)   Pulse 75   Temp 98.3 F (36.8 C) (Oral)   Resp 16   Ht 5' 1 (1.549 m)   Wt 53.5 kg   LMP 08/30/2023 (Approximate)   SpO2 100%   BMI 22.30 kg/m   Visual Acuity Right Eye Distance:   Left Eye Distance:   Bilateral Distance:    Right Eye Near:   Left Eye Near:    Bilateral Near:     Physical Exam GEN:     alert, non-toxic appearing female in no distress    HENT:  mucus membranes moist, oropharyngeal without lesions or erythema, no tonsillar hypertrophy or exudates, clear nasal discharge EYES:   no scleral injection or discharge NECK:  normal ROM, no meningismus   RESP:  no increased work of breathing, clear to auscultation bilaterally CVS:   regular rate and rhythm Skin:   warm and dry    UC Treatments / Results  Labs (all labs ordered are listed, but only abnormal results are displayed) Labs Reviewed  RESP PANEL BY RT-PCR (FLU A&B, COVID) [id number]    EKG   Radiology No results found.  Procedures Procedures (including critical care time)  Medications Ordered in UC Medications - No data to display  Initial Impression / Assessment and Plan / UC Course  I have reviewed the triage vital signs and the nursing notes.  Pertinent labs & imaging results that were available during my care of the patient were reviewed by me and considered  in my medical decision making (  see chart for details).       Pt is a 33 y.o. female who presents for 1 day of respiratory symptoms. Jeanette Miranda is afebrile here without recent antipyretics. Satting well on room air. Overall pt is ill but non-toxic appearing, well hydrated, without respiratory distress. Pulmonary exam is unremarkable.  COVID and influenza panel obtained and was negative.  History most consistent with viral respiratory illness. Discussed symptomatic treatment.  Explained lack of efficacy of antibiotics in viral disease.  Typical duration of symptoms discussed. Work note provided.   Return and ED precautions given and voiced understanding. Discussed MDM, treatment plan and plan for follow-up with patient who agrees with plan.     Final Clinical Impressions(s) / UC Diagnoses   Final diagnoses:  Viral URI with cough     Discharge Instructions      GREENLEE ANCHETA, your  influenza and COVID are all negative. You have a viral respiratory infection that will gradually improve over the next 7-10 days. Cough may last up to 3 weeks.    You can take Tylenol  and/or Ibuprofen  as needed for fever reduction and pain relief.    For cough: honey 1/2 to 1 teaspoon (you can dilute the honey in water or another fluid). You can also use guaifenesin and dextromethorphan for cough. You can use a humidifier for chest congestion and cough.  If you don't have a humidifier, you can sit in the bathroom with the hot shower running.      For sore throat: try warm salt water gargles, Mucinex sore throat cough drops or cepacol lozenges, throat spray, warm tea or water with lemon/honey, popsicles or ice, or OTC cold relief medicine for throat discomfort. You can also purchase chloraseptic spray at the pharmacy or dollar store.   For congestion: take a daily anti-histamine like Zyrtec, Claritin , and a oral decongestant, such as pseudoephedrine.  You can also use Flonase  1-2 sprays in each nostril daily.  Afrin is also a good option, if you do not have high blood pressure.    It is important to stay hydrated: drink plenty of fluids (water, gatorade/powerade/pedialyte, juices, or teas) to keep your throat moisturized and help further relieve irritation/discomfort.    Return or go to the Emergency Department if symptoms worsen or do not improve in the next few days      ED Prescriptions   None    PDMP not reviewed this encounter.   Aubryanna Nesheim, DO 09/11/23 1452

## 2023-09-11 NOTE — ED Triage Notes (Signed)
 Pt states left work last night feeling sick, headache, nauseated, kept throwing up in my mouth, clamy feeling and fatigued still - Entered by patient. No OTC meds.

## 2023-09-11 NOTE — Discharge Instructions (Addendum)
 Jeanette Miranda, your  influenza and COVID are all negative. You have a viral respiratory infection that will gradually improve over the next 7-10 days. Cough may last up to 3 weeks.    You can take Tylenol and/or Ibuprofen as needed for fever reduction and pain relief.    For cough: honey 1/2 to 1 teaspoon (you can dilute the honey in water or another fluid). You can also use guaifenesin and dextromethorphan for cough. You can use a humidifier for chest congestion and cough.  If you don't have a humidifier, you can sit in the bathroom with the hot shower running.      For sore throat: try warm salt water gargles, Mucinex sore throat cough drops or cepacol lozenges, throat spray, warm tea or water with lemon/honey, popsicles or ice, or OTC cold relief medicine for throat discomfort. You can also purchase chloraseptic spray at the pharmacy or dollar store.   For congestion: take a daily anti-histamine like Zyrtec, Claritin, and a oral decongestant, such as pseudoephedrine.  You can also use Flonase 1-2 sprays in each nostril daily. Afrin is also a good option, if you do not have high blood pressure.    It is important to stay hydrated: drink plenty of fluids (water, gatorade/powerade/pedialyte, juices, or teas) to keep your throat moisturized and help further relieve irritation/discomfort.    Return or go to the Emergency Department if symptoms worsen or do not improve in the next few days

## 2023-09-16 ENCOUNTER — Ambulatory Visit
Admission: RE | Admit: 2023-09-16 | Discharge: 2023-09-16 | Disposition: A | Discharge status: Home / Self Care | Source: Ambulatory Visit | Attending: Emergency Medicine | Admitting: Emergency Medicine

## 2023-09-16 VITALS — BP 116/81 | HR 99 | Temp 98.2°F | Resp 16 | Wt 114.0 lb

## 2023-09-16 DIAGNOSIS — J0100 Acute maxillary sinusitis, unspecified: Secondary | ICD-10-CM

## 2023-09-16 MED ORDER — IBUPROFEN 600 MG PO TABS: 600.0000 mg | ORAL_TABLET | Freq: Three times a day (TID) | ORAL | 0 refills | Status: DC | PRN

## 2023-09-16 MED ORDER — PREDNISONE 20 MG PO TABS: 40.0000 mg | ORAL_TABLET | Freq: Every day | ORAL | 0 refills | Status: AC

## 2023-09-16 MED ORDER — FLUTICASONE PROPIONATE 50 MCG/ACT NA SUSP: 2.0000 | Freq: Every day | NASAL | 0 refills | Status: DC

## 2023-09-16 MED ORDER — AMOXICILLIN-POT CLAVULANATE 875-125 MG PO TABS: 1.0000 | ORAL_TABLET | Freq: Two times a day (BID) | ORAL | 0 refills | Status: DC

## 2023-09-16 NOTE — ED Provider Notes (Signed)
 HPI  SUBJECTIVE:  Jeanette Miranda is a 33 y.o. female who presents with  6 days of sinus pain and pressure, nasal congestion, yellow-green, foul-smelling rhinorrhea, foul taste in her mouth, upper dental pain, postnasal drip.  She reports an occasional cough productive of yellow-green sputum starting today.  She is able to sleep at night without waking up coughing.  No wheezing, shortness of breath, fevers, facial swelling.  No antipyretic in the past 6 hours.  No antibiotics in the past month.  She tried Tylenol  cold and flu with temporary improvement in her symptoms.  Symptoms worsened during forward.   Thus far s flourescin he has a past medical history of asthma, GERD and allergies.  She has sinus infections about twice a year after having URIs.  LMP: 2 weeks ago.  Denies possibility of being pregnant.  PCP: [facility]  She was seen here on 8/14, 5 days ago and found to have a viral URI.  COVID, flu were negative.  Past Medical History:  Diagnosis Date   Allergy    Asthma    GERD (gastroesophageal reflux disease)    Hyperemesis 11/14/2018   UTI (urinary tract infection)     Past Surgical History:  Procedure Laterality Date   BREAST BIOPSY Right 2019   benign fibroadenoma and currently asymptomatic   TONSILLECTOMY AND ADENOIDECTOMY  2012   WISDOM TOOTH EXTRACTION Bilateral 2011    Family History  Problem Relation Age of Onset   Crohn's disease Mother    Asthma Mother    Depression Mother    Early death Mother    Anxiety disorder Mother    Arthritis Father    Depression Father    Anxiety disorder Father    Glaucoma Father    Asthma Brother    Heart disease Maternal Grandfather    Arthritis Paternal Grandmother    Depression Paternal Grandmother    Mental retardation Paternal Grandmother    Heart disease Paternal Grandfather        Additionally heart disease in great-grandfasther and great-great-grandfather   Arthritis Paternal Grandfather     Social  History   Tobacco Use   Smoking status: Never    Passive exposure: Never   Smokeless tobacco: Never  Vaping Use   Vaping status: Never Used  Substance Use Topics   Alcohol use: Not Currently   Drug use: Never    No current facility-administered medications for this encounter.  Current Outpatient Medications:    amoxicillin -clavulanate (AUGMENTIN ) 875-125 MG tablet, Take 1 tablet by mouth every 12 (twelve) hours., Disp: 14 tablet, Rfl: 0   fluticasone  (FLONASE ) 50 MCG/ACT nasal spray, Place 2 sprays into both nostrils daily., Disp: 16 g, Rfl: 0   ibuprofen  (ADVIL ) 600 MG tablet, Take 1 tablet (600 mg total) by mouth every 8 (eight) hours as needed., Disp: 30 tablet, Rfl: 0   predniSONE  (DELTASONE ) 20 MG tablet, Take 2 tablets (40 mg total) by mouth daily with breakfast for 5 days., Disp: 10 tablet, Rfl: 0   albuterol  (VENTOLIN  HFA) 108 (90 Base) MCG/ACT inhaler, Inhale 1-2 puffs into the lungs every 6 (six) hours as needed for wheezing or shortness of breath., Disp: 6.7 g, Rfl: 2   DULoxetine  (CYMBALTA ) 30 MG capsule, Take 3 capsules (90 mg total) by mouth daily., Disp: 90 capsule, Rfl: 3   [Paused] meloxicam  (MOBIC ) 7.5 MG tablet, Take 1 tablet (7.5 mg total) by mouth daily., Disp: 30 tablet, Rfl: 0   Norethindrone  Acetate-Ethinyl Estrad-FE (MICROGESTIN  24 FE)  1-20 MG-MCG(24) tablet, Take 1 tablet by mouth daily., Disp: 84 tablet, Rfl: 0   SODIUM FLUORIDE 5000 PPM 1.1 % PSTE, USE 3 TIMES PER DAY, Disp: , Rfl:   Allergies  Allergen Reactions   Reglan  [Metoclopramide ] Other (See Comments)    Hot flash, unpleasant feeling      ROS  As noted in HPI.   Physical Exam  BP 116/81 (BP Location: Left Arm)   Pulse 99   Temp 98.2 F (36.8 C) (Oral)   Resp 16   Wt 51.7 kg   LMP 08/30/2023 (Approximate)   SpO2 99%   BMI 21.54 kg/m   Constitutional: Well developed, well nourished, no acute distress Eyes: PERRL, EOMI, conjunctiva normal bilaterally HENT: Normocephalic,  atraumatic,mucus membranes moist.  Purulent nasal congestion.  Erythematous, swollen joints.  Positive maxillary sinus tenderness.  No frontal sinus tenderness.  Extensive postnasal drip.  Normal oropharynx. Respiratory: Clear to auscultation bilaterally, no rales, no wheezing, no rhonchi Cardiovascular: Normal rate  GI: nondistended skin: No rash, skin intact Musculoskeletal: no deformities Neurologic: Alert & oriented x 3, CN III-XII grossly intact, no motor deficits, sensation grossly intact Psychiatric: Speech and behavior appropriate   ED Course   Medications - No data to display  No orders of the defined types were placed in this encounter.  No results found for this or any previous visit (from the past 24 hours). No results found.  ED Clinical Impression  1. Acute non-recurrent maxillary sinusitis      ED Assessment/Plan    [facility] records reviewed.  As noted in HPI.  Patient presents with an acute maxillary sinusitis.  She qualifies for antibiotics due to severe symptoms of upper dental pain, foul-smelling, purulent rhinorrhea and postnasal drip.  Home with Augmentin  875 mg p.o. twice daily for 7 days, saline nasal irrigation, Mucinex D, Flonase , prednisone  40 mg for 5 days, Tylenol  combined with ibuprofen  3-4 times a day as needed for pain.  Follow-up with PCP as needed.  Discussed MDM, treatment plan, and plan for follow-up with patient  patient agrees with plan.   Meds ordered this encounter  Medications   fluticasone  (FLONASE ) 50 MCG/ACT nasal spray    Sig: Place 2 sprays into both nostrils daily.    Dispense:  16 g    Refill:  0   ibuprofen  (ADVIL ) 600 MG tablet    Sig: Take 1 tablet (600 mg total) by mouth every 8 (eight) hours as needed.    Dispense:  30 tablet    Refill:  0   amoxicillin -clavulanate (AUGMENTIN ) 875-125 MG tablet    Sig: Take 1 tablet by mouth every 12 (twelve) hours.    Dispense:  14 tablet    Refill:  0   predniSONE  (DELTASONE ) 20 MG  tablet    Sig: Take 2 tablets (40 mg total) by mouth daily with breakfast for 5 days.    Dispense:  10 tablet    Refill:  0      *This clinic note was created using Scientist, clinical (histocompatibility and immunogenetics). Therefore, there may be occasional mistakes despite careful proofreading. ?    Van Knee, MD 09/19/23 1031

## 2023-09-16 NOTE — ED Triage Notes (Signed)
 Pt was seen 09/11/23 and is not better. She has congestion, headache, sweats, scratchy throat and sneezing. Pt has taken tylenol  cold & flu with temporary relief.

## 2023-09-16 NOTE — Discharge Instructions (Signed)
 Start Mucinex-D to keep the mucous thin and to decongest you.   You may take 600 mg of motrin  with 1000 mg of tylenol  up to 3-4 times a day as needed for pain.  Do not take Mobic  if taking the ibuprofen .  This is an effective combination for pain.  Finish the steroids and Augmentin , even if you feel better.  Use a [employer] sinus rinse with distilled water as often as you want to to reduce nasal congestion. Follow the directions on the box.  Flonase  for postnasal drip.  Go to www.goodrx.com to look up your medications. This will give you a list of where you can find your prescriptions at the most affordable prices. Or you can ask the pharmacist what the cash price is. This is frequently cheaper than going through insurance.

## 2023-09-22 ENCOUNTER — Encounter: Payer: Self-pay | Admitting: Obstetrics and Gynecology

## 2023-09-22 ENCOUNTER — Other Ambulatory Visit: Payer: Self-pay | Admitting: Obstetrics and Gynecology

## 2023-09-22 MED ORDER — MICROGESTIN 24 FE 1-20 MG-MCG PO TABS: 1.0000 | ORAL_TABLET | Freq: Every day | ORAL | 0 refills | Status: DC

## 2023-09-27 ENCOUNTER — Other Ambulatory Visit: Payer: Self-pay | Admitting: Family Medicine

## 2023-09-30 NOTE — Telephone Encounter (Signed)
 Requested Prescriptions  Pending Prescriptions Disp Refills   DULoxetine  (CYMBALTA ) 30 MG capsule [Pharmacy Med Name: DULOXETINE  HCL DR 30 MG CAP] 270 capsule 0    Sig: TAKE 3 CAPSULES BY MOUTH EVERY DAY     Psychiatry: Antidepressants - SNRI - duloxetine  Passed - 09/30/2023 10:44 AM      Passed - Cr in normal range and within 360 days    Creatinine  Date Value Ref Range Status  10/29/2011 0.61 0.60 - 1.30 mg/dL Final   Creatinine, Ser  Date Value Ref Range Status  09/03/2023 0.67 0.57 - 1.00 mg/dL Final         Passed - eGFR is 30 or above and within 360 days    EGFR (African American)  Date Value Ref Range Status  10/29/2011 >60  Final   GFR calc Af Amer  Date Value Ref Range Status  11/13/2018 >60 >60 mL/min Final   EGFR (Non-African Amer.)  Date Value Ref Range Status  10/29/2011 >60  Final    Comment:    eGFR values <14mL/min/1.73 m2 may be an indication of chronic kidney disease (CKD). Calculated eGFR is useful in patients with stable renal function. The eGFR calculation will not be reliable in acutely ill patients when serum creatinine is changing rapidly. It is not useful in  patients on dialysis. The eGFR calculation may not be applicable to patients at the low and high extremes of body sizes, pregnant women, and vegetarians.    GFR calc non Af Amer  Date Value Ref Range Status  11/13/2018 >60 >60 mL/min Final   eGFR  Date Value Ref Range Status  09/03/2023 120 >59 mL/min/1.73 Final         Passed - Completed PHQ-2 or PHQ-9 in the last 360 days      Passed - Last BP in normal range    BP Readings from Last 1 Encounters:  09/16/23 116/81         Passed - Valid encounter within last 6 months    Recent Outpatient Visits           3 weeks ago Healthcare maintenance   [facility] at [name], Selinda PARAS, MD   2 months ago Anxiety and depression   [facility] at [facility], Selinda PARAS, MD       Future Appointments             In 11 months Jeanette Miranda, Selinda PARAS, MD [facility] at [facility], [phone] [place]

## 2023-10-01 NOTE — Progress Notes (Deleted)
 PCP:  Alvia Selinda PARAS, MD   No chief complaint on file.    HPI:      [name] is a 33 y.o. [id number] whose LMP was Patient's last menstrual period was 08/30/2023 (approximate)., presents today for her annual examination.  Her menses are {norm/abn:715}, lasting {number: 22536} days.  Dysmenorrhea {dysmen:716}. She {does:18564} have intermenstrual bleeding.  Sex activity: {sex active: 315163}. On OCPs, IUD removed 8/24. No pain/bleeding/dryness. Last Pap: 01/31/20 Results were: no abnormalities  Hx of STDs: {STD hx:14358}  There is no FH of breast cancer. There is no FH of ovarian cancer. The patient {does:18564} do self-breast exams.  Tobacco use: {tob:20664} Alcohol use: {Alcohol:11675} No drug use.  Exercise: {exercise:31265}  She {does:18564} get adequate calcium and Vitamin D  in her diet.  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/2019   Preterm delivery, delivered 06/10/2019   Threatened preterm labor, third trimester 05/17/2019   Vaginal bleeding in pregnancy, third trimester 05/16/2019   Supervision of low-risk pregnancy 11/13/2018   Anxiety and depression 11/13/2018   Endometriosis 08/26/2018   Breast mass in female 03/07/2017    Past Surgical History:  Procedure Laterality Date   BREAST BIOPSY Right 2019   benign fibroadenoma and currently asymptomatic   TONSILLECTOMY AND ADENOIDECTOMY  2012   WISDOM TOOTH EXTRACTION Bilateral 2011    Family History  Problem Relation Age of Onset   Crohn's disease Mother    Asthma Mother    Depression Mother    Early death Mother     Anxiety disorder Mother    Arthritis Father    Depression Father    Anxiety disorder Father    Glaucoma Father    Asthma Brother    Heart disease Maternal Grandfather    Arthritis Paternal Grandmother    Depression Paternal Grandmother    Mental retardation Paternal Grandmother    Heart disease Paternal Grandfather        Additionally heart disease in great-grandfasther and great-great-grandfather   Arthritis Paternal Grandfather     Social History   Socioeconomic History   Marital status: Married    Spouse name: Havilah Topor   Number of children: 2   Years of education: 12+   Highest education level: Associate degree: occupational, Scientist, product/process development, or vocational program  Occupational History   Not on file  Tobacco Use   Smoking status: Never    Passive exposure: Never   Smokeless tobacco: Never  Vaping Use   Vaping status: Never Used  Substance and Sexual Activity   Alcohol use: Not Currently   Drug use: Never   Sexual activity: Yes    Partners: Male    Birth control/protection: Pill    Comment: Mirena   Other Topics Concern   Not on file  Social History Narrative   Not on file   Social Drivers of Health   Financial Resource Strain: Low Risk  (09/02/2023)   Overall Financial Resource Strain (CARDIA)    Difficulty of Paying Living Expenses: Not very hard  Food Insecurity: Food Insecurity Present (09/02/2023)   Hunger Vital Sign    Worried About Running Out of Food in the Last Year: Never true  Ran Out of Food in the Last Year: Sometimes true  Transportation Needs: No Transportation Needs (09/02/2023)   PRAPARE - Administrator, Civil Service (Medical): No    Lack of Transportation (Non-Medical): No  Physical Activity: Insufficiently Active (09/02/2023)   Exercise Vital Sign    Days of Exercise per Week: 3 days    Minutes of Exercise per Session: 30 min  Stress: No Stress Concern Present (09/02/2023)   [employer] of Occupational Health - Occupational  Stress Questionnaire    Feeling of Stress: Only a little  Social Connections: Socially Isolated (09/02/2023)   Social Connection and Isolation Panel    Frequency of Communication with Friends and Family: More than three times a week    Frequency of Social Gatherings with Friends and Family: Three times a week    Attends Religious Services: Never    Active Member of Clubs or Organizations: No    Attends Banker Meetings: Not on file    Marital Status: Separated  Intimate Partner Violence: Not At Risk (12/31/2021)   Humiliation, Afraid, Rape, and Kick questionnaire    Fear of Current or Ex-Partner: No    Emotionally Abused: No    Physically Abused: No    Sexually Abused: No     Current Outpatient Medications:    albuterol  (VENTOLIN  HFA) 108 (90 Base) MCG/ACT inhaler, Inhale 1-2 puffs into the lungs every 6 (six) hours as needed for wheezing or shortness of breath., Disp: 6.7 g, Rfl: 2   amoxicillin -clavulanate (AUGMENTIN ) 875-125 MG tablet, Take 1 tablet by mouth every 12 (twelve) hours., Disp: 14 tablet, Rfl: 0   DULoxetine  (CYMBALTA ) 30 MG capsule, TAKE 3 CAPSULES BY MOUTH EVERY DAY, Disp: 270 capsule, Rfl: 0   fluticasone  (FLONASE ) 50 MCG/ACT nasal spray, Place 2 sprays into both nostrils daily., Disp: 16 g, Rfl: 0   ibuprofen  (ADVIL ) 600 MG tablet, Take 1 tablet (600 mg total) by mouth every 8 (eight) hours as needed., Disp: 30 tablet, Rfl: 0   meloxicam  (MOBIC ) 7.5 MG tablet, Take 1 tablet (7.5 mg total) by mouth daily., Disp: 30 tablet, Rfl: 0   Norethindrone  Acetate-Ethinyl Estrad-FE (MICROGESTIN  24 FE) 1-20 MG-MCG(24) tablet, Take 1 tablet by mouth daily., Disp: 84 tablet, Rfl: 0   SODIUM FLUORIDE 5000 PPM 1.1 % PSTE, USE 3 TIMES PER DAY, Disp: , Rfl:      ROS:  Review of Systems BREAST: No symptoms   Objective: LMP 08/30/2023 (Approximate)    OBGyn Exam  Results: No results found for this or any previous visit (from the past 24  hours).  Assessment/Plan: No diagnosis found.  No orders of the defined types were placed in this encounter.            GYN counsel {counseling: 16159}     F/U  No follow-ups on file.  Jasmyne Lodato B. Destyne Goodreau, PA-C 10/01/2023 12:14 PM

## 2023-10-02 ENCOUNTER — Ambulatory Visit: Admitting: Obstetrics and Gynecology

## 2023-10-02 DIAGNOSIS — Z1151 Encounter for screening for human papillomavirus (HPV): Secondary | ICD-10-CM

## 2023-10-02 DIAGNOSIS — Z124 Encounter for screening for malignant neoplasm of cervix: Secondary | ICD-10-CM

## 2023-10-02 DIAGNOSIS — Z3041 Encounter for surveillance of contraceptive pills: Secondary | ICD-10-CM

## 2023-10-02 DIAGNOSIS — Z01419 Encounter for gynecological examination (general) (routine) without abnormal findings: Secondary | ICD-10-CM

## 2023-10-28 NOTE — Progress Notes (Unsigned)
 PCP:  Alvia Selinda PARAS, MD   No chief complaint on file.    HPI:      [name] is a 33 y.o. [id number] whose LMP was No LMP recorded. (Menstrual status: Oral contraceptives)., presents today for her annual examination.  Her menses are {norm/abn:715}, lasting {number: 22536} days.  Dysmenorrhea {dysmen:716}. She {does:18564} have intermenstrual bleeding.  Sex activity: {sex active: 315163}. On OCPs, IUD removed 8/24. No pain/bleeding/dryness. Last Pap: 01/31/20 Results were: no abnormalities  Hx of STDs: {STD hx:14358}  There is no FH of breast cancer. There is no FH of ovarian cancer. The patient {does:18564} do self-breast exams.  Tobacco use: {tob:20664} Alcohol use: {Alcohol:11675} No drug use.  Exercise: {exercise:31265}  She {does:18564} get adequate calcium and Vitamin D  in her diet.  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/2019   Preterm delivery, delivered 06/10/2019   Threatened preterm labor, third trimester 05/17/2019   Vaginal bleeding in pregnancy, third trimester 05/16/2019   Supervision of low-risk pregnancy 11/13/2018   Anxiety and depression 11/13/2018   Endometriosis 08/26/2018   Breast mass in female 03/07/2017    Past Surgical History:  Procedure Laterality Date   BREAST BIOPSY Right 2019   benign fibroadenoma and currently asymptomatic   TONSILLECTOMY AND ADENOIDECTOMY  2012   WISDOM TOOTH EXTRACTION Bilateral 2011    Family History  Problem Relation Age of Onset   Crohn's disease Mother    Asthma Mother    Depression Mother    Early death Mother     Anxiety disorder Mother    Arthritis Father    Depression Father    Anxiety disorder Father    Glaucoma Father    Asthma Brother    Heart disease Maternal Grandfather    Arthritis Paternal Grandmother    Depression Paternal Grandmother    Mental retardation Paternal Grandmother    Heart disease Paternal Grandfather        Additionally heart disease in great-grandfasther and great-great-grandfather   Arthritis Paternal Grandfather     Social History   Socioeconomic History   Marital status: Married    Spouse name: Wilfred Siverson   Number of children: 2   Years of education: 12+   Highest education level: Associate degree: occupational, Scientist, product/process development, or vocational program  Occupational History   Not on file  Tobacco Use   Smoking status: Never    Passive exposure: Never   Smokeless tobacco: Never  Vaping Use   Vaping status: Never Used  Substance and Sexual Activity   Alcohol use: Not Currently   Drug use: Never   Sexual activity: Yes    Partners: Male    Birth control/protection: Pill    Comment: Mirena   Other Topics Concern   Not on file  Social History Narrative   Not on file   Social Drivers of Health   Financial Resource Strain: Low Risk  (09/02/2023)   Overall Financial Resource Strain (CARDIA)    Difficulty of Paying Living Expenses: Not very hard  Food Insecurity: Food Insecurity Present (09/02/2023)   Hunger Vital Sign    Worried About Running Out of Food in the Last Year: Never true  Ran Out of Food in the Last Year: Sometimes true  Transportation Needs: No Transportation Needs (09/02/2023)   PRAPARE - Administrator, Civil Service (Medical): No    Lack of Transportation (Non-Medical): No  Physical Activity: Insufficiently Active (09/02/2023)   Exercise Vital Sign    Days of Exercise per Week: 3 days    Minutes of Exercise per Session: 30 min  Stress: No Stress Concern Present (09/02/2023)   [employer] of Occupational Health - Occupational  Stress Questionnaire    Feeling of Stress: Only a little  Social Connections: Socially Isolated (09/02/2023)   Social Connection and Isolation Panel    Frequency of Communication with Friends and Family: More than three times a week    Frequency of Social Gatherings with Friends and Family: Three times a week    Attends Religious Services: Never    Active Member of Clubs or Organizations: No    Attends Banker Meetings: Not on file    Marital Status: Separated  Intimate Partner Violence: Not At Risk (12/31/2021)   Humiliation, Afraid, Rape, and Kick questionnaire    Fear of Current or Ex-Partner: No    Emotionally Abused: No    Physically Abused: No    Sexually Abused: No     Current Outpatient Medications:    albuterol  (VENTOLIN  HFA) 108 (90 Base) MCG/ACT inhaler, Inhale 1-2 puffs into the lungs every 6 (six) hours as needed for wheezing or shortness of breath., Disp: 6.7 g, Rfl: 2   amoxicillin -clavulanate (AUGMENTIN ) 875-125 MG tablet, Take 1 tablet by mouth every 12 (twelve) hours., Disp: 14 tablet, Rfl: 0   DULoxetine  (CYMBALTA ) 30 MG capsule, TAKE 3 CAPSULES BY MOUTH EVERY DAY, Disp: 270 capsule, Rfl: 0   fluticasone  (FLONASE ) 50 MCG/ACT nasal spray, Place 2 sprays into both nostrils daily., Disp: 16 g, Rfl: 0   ibuprofen  (ADVIL ) 600 MG tablet, Take 1 tablet (600 mg total) by mouth every 8 (eight) hours as needed., Disp: 30 tablet, Rfl: 0   meloxicam  (MOBIC ) 7.5 MG tablet, Take 1 tablet (7.5 mg total) by mouth daily., Disp: 30 tablet, Rfl: 0   Norethindrone  Acetate-Ethinyl Estrad-FE (MICROGESTIN  24 FE) 1-20 MG-MCG(24) tablet, Take 1 tablet by mouth daily., Disp: 84 tablet, Rfl: 0   SODIUM FLUORIDE 5000 PPM 1.1 % PSTE, USE 3 TIMES PER DAY, Disp: , Rfl:      ROS:  Review of Systems BREAST: No symptoms   Objective: There were no vitals taken for this visit.   OBGyn Exam  Results: No results found for this or any previous visit (from the past 24  hours).  Assessment/Plan: No diagnosis found.  No orders of the defined types were placed in this encounter.            GYN counsel {counseling: 16159}     F/U  No follow-ups on file.  Andrae Claunch B. Jack Mineau, PA-C 10/28/2023 10:48 AM

## 2023-10-30 ENCOUNTER — Other Ambulatory Visit (HOSPITAL_COMMUNITY)
Admission: RE | Admit: 2023-10-30 | Discharge: 2023-10-30 | Disposition: A | Source: Ambulatory Visit | Attending: Obstetrics and Gynecology | Admitting: Obstetrics and Gynecology

## 2023-10-30 ENCOUNTER — Encounter: Payer: Self-pay | Admitting: Obstetrics and Gynecology

## 2023-10-30 ENCOUNTER — Ambulatory Visit (INDEPENDENT_AMBULATORY_CARE_PROVIDER_SITE_OTHER): Admitting: Obstetrics and Gynecology

## 2023-10-30 VITALS — BP 119/74 | HR 106 | Ht 61.0 in | Wt 116.0 lb

## 2023-10-30 DIAGNOSIS — Z1151 Encounter for screening for human papillomavirus (HPV): Secondary | ICD-10-CM | POA: Diagnosis not present

## 2023-10-30 DIAGNOSIS — Z01419 Encounter for gynecological examination (general) (routine) without abnormal findings: Secondary | ICD-10-CM | POA: Diagnosis not present

## 2023-10-30 DIAGNOSIS — Z124 Encounter for screening for malignant neoplasm of cervix: Secondary | ICD-10-CM

## 2023-10-30 DIAGNOSIS — Z3041 Encounter for surveillance of contraceptive pills: Secondary | ICD-10-CM

## 2023-10-30 MED ORDER — DROSPIRENONE-ETHINYL ESTRADIOL 3-0.02 MG PO TABS: 1.0000 | ORAL_TABLET | Freq: Every day | ORAL | 3 refills | Status: AC

## 2023-10-30 NOTE — Patient Instructions (Signed)
 I value your feedback and you entrusting [place] with your care. If you get a [place] patient survey, I would appreciate you taking the time to let us know about your experience today. Thank you! ? ? ?

## 2023-10-31 LAB — CYTOLOGY - PAP
Comment: NEGATIVE
Diagnosis: NEGATIVE
High risk HPV: NEGATIVE

## 2024-01-07 ENCOUNTER — Ambulatory Visit
Admission: EM | Admit: 2024-01-07 | Discharge: 2024-01-07 | Disposition: A | Discharge status: Home / Self Care | Attending: Physician Assistant | Admitting: Physician Assistant

## 2024-01-07 DIAGNOSIS — R051 Acute cough: Secondary | ICD-10-CM

## 2024-01-07 DIAGNOSIS — B349 Viral infection, unspecified: Secondary | ICD-10-CM | POA: Diagnosis not present

## 2024-01-07 DIAGNOSIS — J029 Acute pharyngitis, unspecified: Secondary | ICD-10-CM | POA: Diagnosis not present

## 2024-01-07 LAB — POC COVID19/FLU A&B COMBO
Covid Antigen, POC: NEGATIVE
Influenza A Antigen, POC: NEGATIVE
Influenza B Antigen, POC: NEGATIVE

## 2024-01-07 LAB — POCT RAPID STREP A (OFFICE): Rapid Strep A Screen: NEGATIVE

## 2024-01-07 MED ORDER — PROMETHAZINE-DM 6.25-15 MG/5ML PO SYRP: 5.0000 mL | ORAL_SOLUTION | Freq: Four times a day (QID) | ORAL | 0 refills | Status: DC | PRN

## 2024-01-07 NOTE — Discharge Instructions (Signed)

## 2024-01-07 NOTE — ED Provider Notes (Signed)
 [facility]    CSN: [id number] Arrival date & time: 01/07/24  0849      History   Chief Complaint Chief Complaint  Patient presents with   Sore Throat   Headache   Cough   Chills    HPI Jeanette Miranda is a 33 y.o. female history of allergies, asthma, GERD, endometriosis, anxiety and depression.  Patient is presenting for feeling feverish with fatigue, cough, congestion, sore throat and body aches x 1 day.  Denies ear pain, sinus pain, chest pain, wheezing, shortness of breath, abdominal pain, vomiting or diarrhea.  Patient has been taking over-the-counter meds. No other complaints.   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   Preterm delivery, delivered 06/10/2019   Threatened preterm labor, third trimester 05/17/2019   Vaginal bleeding in pregnancy, third trimester 05/16/2019   Supervision of low-risk pregnancy 11/13/2018   Anxiety and depression 11/13/2018   Endometriosis 08/26/2018   Breast mass in female 03/07/2017    Past Surgical History:  Procedure Laterality Date   BREAST BIOPSY Right 2019   benign fibroadenoma and currently asymptomatic   TONSILLECTOMY AND ADENOIDECTOMY  2012   WISDOM TOOTH EXTRACTION Bilateral 2011    OB History     Gravida  2   Para  2   Term  1   Preterm  1   AB      Living  2      SAB      IAB      Ectopic      Multiple  0   Live Births  2         Obstetric Comments  1st Menstrual Cycle:  13 1st Pregnancy:  23          Home Medications    Prior to Admission medications   Medication Sig Start Date End Date Taking? Authorizing Provider  promethazine -dextromethorphan (PROMETHAZINE -DM) 6.25-15 MG/5ML syrup Take 5 mLs by mouth 4 (four) times daily as needed. 01/07/24  Yes Arvis Jolan NOVAK, PA-C  albuterol  (VENTOLIN  HFA) 108 (90 Base) MCG/ACT inhaler Inhale 1-2 puffs into the lungs every 6 (six) hours as needed for wheezing or shortness of breath. 03/19/21   Matthews, Jason J, MD  drospirenone -ethinyl estradiol  (YAZ) 3-0.02 MG tablet Take 1 tablet by mouth daily. 10/30/23   Copland, Alicia B, PA-C  DULoxetine  (CYMBALTA ) 30 MG capsule TAKE 3 CAPSULES BY MOUTH EVERY DAY 09/30/23   Matthews, Jason J, MD  DULoxetine  (CYMBALTA ) 60 MG capsule Take 60 mg by mouth daily. 10/21/23   [provider]    Family History Family History  Problem Relation Age of Onset   Crohn's disease Mother    Asthma Mother    Depression Mother    Early death Mother    Anxiety disorder Mother    Arthritis Father    Depression Father    Anxiety disorder Father    Glaucoma Father    Asthma Brother  Heart disease Maternal Grandfather    Arthritis Paternal Grandmother    Depression Paternal Grandmother    Mental retardation Paternal Grandmother    Heart disease Paternal Grandfather        Additionally heart disease in great-grandfasther and great-great-grandfather   Arthritis Paternal Grandfather     Social History Social History   Tobacco Use   Smoking status: Never    Passive exposure: Never   Smokeless tobacco: Never  Vaping Use   Vaping status: Never Used  Substance Use Topics   Alcohol use: Yes    Comment: occ   Drug use: Never     Allergies   Reglan  [metoclopramide ]   Review of Systems Review of Systems  Constitutional:  Positive for diaphoresis and fatigue. Negative for chills and fever.  HENT:  Positive for congestion,  rhinorrhea and sore throat. Negative for ear pain, sinus pressure and sinus pain.   Respiratory:  Positive for cough. Negative for shortness of breath.   Cardiovascular:  Negative for chest pain.  Gastrointestinal:  Negative for abdominal pain, nausea and vomiting.  Musculoskeletal:  Positive for myalgias.  Skin:  Negative for rash.  Neurological:  Positive for headaches. Negative for weakness.  Hematological:  Negative for adenopathy.     Physical Exam Triage Vital Signs ED Triage Vitals  Encounter Vitals Group     BP      Girls Systolic BP Percentile      Girls Diastolic BP Percentile      Boys Systolic BP Percentile      Boys Diastolic BP Percentile      Pulse      Resp      Temp      Temp src      SpO2      Weight      Height      Head Circumference      Peak Flow      Pain Score      Pain Loc      Pain Education      Exclude from Growth Chart    No data found.  Updated Vital Signs BP 107/72 (BP Location: Right Arm)   Pulse 98   Temp 98.3 F (36.8 C) (Oral)   Resp 16   Wt 116 lb 1.6 oz (52.7 kg)   SpO2 96%   BMI 21.94 kg/m   Physical Exam Vitals and nursing note reviewed.  Constitutional:      General: She is not in acute distress.    Appearance: Normal appearance. She is not ill-appearing or toxic-appearing.  HENT:     Head: Normocephalic and atraumatic.     Nose: Congestion present.     Mouth/Throat:     Mouth: Mucous membranes are moist.     Pharynx: Oropharynx is clear. Posterior oropharyngeal erythema present.  Eyes:     General: No scleral icterus.       Right eye: No discharge.        Left eye: No discharge.     Conjunctiva/sclera: Conjunctivae normal.  Cardiovascular:     Rate and Rhythm: Normal rate and regular rhythm.     Heart sounds: Normal heart sounds.  Pulmonary:     Effort: Pulmonary effort is normal. No respiratory distress.     Breath sounds: Normal breath sounds.  Musculoskeletal:     Cervical back: Neck supple.  Skin:     General: Skin is dry.  Neurological:     General: No focal deficit present.  Mental Status: She is alert. Mental status is at baseline.     Motor: No weakness.     Gait: Gait normal.  Psychiatric:        Mood and Affect: Mood normal.        Behavior: Behavior normal.      UC Treatments / Results  Labs (all labs ordered are listed, but only abnormal results are displayed) Labs Reviewed  POC COVID19/FLU A&B COMBO - Normal  POCT RAPID STREP A (OFFICE) - Normal    EKG   Radiology No results found.  Procedures Procedures (including critical care time)  Medications Ordered in UC Medications - No data to display  Initial Impression / Assessment and Plan / UC Course  I have reviewed the triage vital signs and the nursing notes.  Pertinent labs & imaging results that were available during my care of the patient were reviewed by me and considered in my medical decision making (see chart for details).   33 year old female presents for feeling feverish with fatigue, cough, congestion, headaches, body aches and sore throat x 1 day.  Rapid flu/COVID and strep testing obtained.  Negative testing.  Viral illness.  Supportive care encouraged with increasing rest and fluids.  Sent Promethazine  DM for cough.  Advised to continue Tylenol  Motrin  as needed for fever.   Reviewed return precautions.   Final Clinical Impressions(s) / UC Diagnoses   Final diagnoses:  Sore throat  Viral illness  Acute cough     Discharge Instructions      URI/COLD SYMPTOMS: Your exam today is consistent with a viral illness. Antibiotics are not indicated at this time. Use medications as directed, including cough syrup, nasal saline, and decongestants. Your symptoms should improve over the next few days and resolve within 7-10 days. Increase rest and fluids. F/u if symptoms worsen or predominate such as sore throat, ear pain, productive cough, shortness of breath, or if you develop high fevers or  worsening fatigue over the next several days.       ED Prescriptions     Medication Sig Dispense Auth. Provider   promethazine -dextromethorphan (PROMETHAZINE -DM) 6.25-15 MG/5ML syrup Take 5 mLs by mouth 4 (four) times daily as needed. 118 mL Arvis Jolan NOVAK, PA-C      PDMP not reviewed this encounter.   Arvis Jolan NOVAK, PA-C 01/07/24 [phone]

## 2024-01-07 NOTE — ED Triage Notes (Signed)
 Pt c/o sore throat,HA,cough,congestion & chills x1 day. Denies fevers. Has tried OTC meds w/o relief.

## 2024-01-09 ENCOUNTER — Other Ambulatory Visit: Payer: Self-pay | Admitting: Family Medicine

## 2024-01-09 DIAGNOSIS — F419 Anxiety disorder, unspecified: Secondary | ICD-10-CM

## 2024-01-12 NOTE — Telephone Encounter (Signed)
 Requested medications are due for refill today.  unsure  Requested medications are on the active medications list.  yes  Last refill. 10/21/2023  Future visit scheduled.   no  Notes to clinic.  Please review for refill.    Requested Prescriptions  Pending Prescriptions Disp Refills   DULoxetine  (CYMBALTA ) 60 MG capsule [Pharmacy Med Name: DULOXETINE  HCL DR 60 MG CAP] 90 capsule 1    Sig: TAKE 1 CAPSULE BY MOUTH EVERY DAY     Psychiatry: Antidepressants - SNRI - duloxetine  Passed - 01/12/2024 12:09 PM      Passed - Cr in normal range and within 360 days    Creatinine  Date Value Ref Range Status  10/29/2011 0.61 0.60 - 1.30 mg/dL Final   Creatinine, Ser  Date Value Ref Range Status  09/03/2023 0.67 0.57 - 1.00 mg/dL Final         Passed - eGFR is 30 or above and within 360 days    EGFR (African American)  Date Value Ref Range Status  10/29/2011 >60  Final   GFR calc Af Amer  Date Value Ref Range Status  11/13/2018 >60 >60 mL/min Final   EGFR (Non-African Amer.)  Date Value Ref Range Status  10/29/2011 >60  Final    Comment:    eGFR values <38mL/min/1.73 m2 may be an indication of chronic kidney disease (CKD). Calculated eGFR is useful in patients with stable renal function. The eGFR calculation will not be reliable in acutely ill patients when serum creatinine is changing rapidly. It is not useful in  patients on dialysis. The eGFR calculation may not be applicable to patients at the low and high extremes of body sizes, pregnant women, and vegetarians.    GFR calc non Af Amer  Date Value Ref Range Status  11/13/2018 >60 >60 mL/min Final   eGFR  Date Value Ref Range Status  09/03/2023 120 >59 mL/min/1.73 Final         Passed - Completed PHQ-2 or PHQ-9 in the last 360 days      Passed - Last BP in normal range    BP Readings from Last 1 Encounters:  01/07/24 107/72         Passed - Valid encounter within last 6 months    Recent Outpatient Visits            4 months ago Healthcare maintenance   [facility] at [facility], Selinda PARAS, MD   5 months ago Anxiety and depression   [facility] at [facility], Selinda PARAS, MD       Future Appointments             In 7 months Alvia, Selinda PARAS, MD [facility] at [facility], [phone] [place]

## 2024-01-20 ENCOUNTER — Other Ambulatory Visit: Payer: Self-pay | Admitting: Family Medicine

## 2024-01-21 NOTE — Telephone Encounter (Signed)
 Requested by interface surescripts. Future visit 09/03/24. Requested Prescriptions  Pending Prescriptions Disp Refills   DULoxetine  (CYMBALTA ) 30 MG capsule [Pharmacy Med Name: DULOXETINE  HCL DR 30 MG CAP] 270 capsule 0    Sig: TAKE 3 CAPSULES BY MOUTH EVERY DAY     Psychiatry: Antidepressants - SNRI - duloxetine  Passed - 01/21/2024  2:12 PM      Passed - Cr in normal range and within 360 days    Creatinine  Date Value Ref Range Status  10/29/2011 0.61 0.60 - 1.30 mg/dL Final   Creatinine, Ser  Date Value Ref Range Status  09/03/2023 0.67 0.57 - 1.00 mg/dL Final         Passed - eGFR is 30 or above and within 360 days    EGFR (African American)  Date Value Ref Range Status  10/29/2011 >60  Final   GFR calc Af Amer  Date Value Ref Range Status  11/13/2018 >60 >60 mL/min Final   EGFR (Non-African Amer.)  Date Value Ref Range Status  10/29/2011 >60  Final    Comment:    eGFR values <53mL/min/1.73 m2 may be an indication of chronic kidney disease (CKD). Calculated eGFR is useful in patients with stable renal function. The eGFR calculation will not be reliable in acutely ill patients when serum creatinine is changing rapidly. It is not useful in  patients on dialysis. The eGFR calculation may not be applicable to patients at the low and high extremes of body sizes, pregnant women, and vegetarians.    GFR calc non Af Amer  Date Value Ref Range Status  11/13/2018 >60 >60 mL/min Final   eGFR  Date Value Ref Range Status  09/03/2023 120 >59 mL/min/1.73 Final         Passed - Completed PHQ-2 or PHQ-9 in the last 360 days      Passed - Last BP in normal range    BP Readings from Last 1 Encounters:  01/07/24 107/72         Passed - Valid encounter within last 6 months    Recent Outpatient Visits           4 months ago Healthcare maintenance   [facility] at [facility], Selinda PARAS, MD   6 months ago Anxiety and depression    [facility] at [facility], Selinda PARAS, MD       Future Appointments             In 7 months Alvia, Selinda PARAS, MD [facility] at [facility], [phone] [place]

## 2024-01-26 ENCOUNTER — Ambulatory Visit
Admission: EM | Admit: 2024-01-26 | Discharge: 2024-01-26 | Disposition: A | Discharge status: Home / Self Care | Attending: Emergency Medicine | Admitting: Emergency Medicine

## 2024-01-26 DIAGNOSIS — J4521 Mild intermittent asthma with (acute) exacerbation: Secondary | ICD-10-CM | POA: Diagnosis not present

## 2024-01-26 DIAGNOSIS — J069 Acute upper respiratory infection, unspecified: Secondary | ICD-10-CM

## 2024-01-26 DIAGNOSIS — B9789 Other viral agents as the cause of diseases classified elsewhere: Secondary | ICD-10-CM

## 2024-01-26 LAB — POCT URINE PREGNANCY: Preg Test, Ur: NEGATIVE

## 2024-01-26 LAB — POC SOFIA SARS ANTIGEN FIA: SARS Coronavirus 2 Ag: NEGATIVE

## 2024-01-26 MED ORDER — IBUPROFEN 600 MG PO TABS: 600.0000 mg | ORAL_TABLET | Freq: Four times a day (QID) | ORAL | 0 refills | Status: AC | PRN

## 2024-01-26 MED ORDER — AEROCHAMBER MV MISC: 1 refills | Status: AC

## 2024-01-26 MED ORDER — PROMETHAZINE-DM 6.25-15 MG/5ML PO SYRP: 5.0000 mL | ORAL_SOLUTION | Freq: Four times a day (QID) | ORAL | 0 refills | Status: AC | PRN

## 2024-01-26 MED ORDER — FLUTICASONE PROPIONATE 50 MCG/ACT NA SUSP: 2.0000 | Freq: Every day | NASAL | 0 refills | Status: AC

## 2024-01-26 MED ORDER — PREDNISONE 20 MG PO TABS: 40.0000 mg | ORAL_TABLET | Freq: Every day | ORAL | 0 refills | Status: AC

## 2024-01-26 NOTE — ED Triage Notes (Addendum)
 Sx x 3 days   Cough Nasal congestion Post nasal drip    No known exposures, but son came back from dads sick.

## 2024-01-26 NOTE — ED Provider Notes (Signed)
 " HPI  SUBJECTIVE:  Jeanette Miranda is a 33 y.o. female who presents with 3 days of sinus pain and pressure, nasal congestion, dark yellow rhinorrhea, postnasal drip, sore throat, cough.  She reports chest soreness from coughing and is unable to sleep at night due to the cough.  She reports intermittent shortness of breath.  She reports a brief episode of upper dental pain that has resolved.  No fevers, facial swelling,  wheezing, dyspnea on exertion.  No known COVID or flu exposure although she has multiple family members with identical illnesses, but no formal diagnosis.  She had 2 doses of the COVID-vaccine.  No antipyretic in the past 6 hours.  No antibiotics in the past month.  She has tried Tylenol , NyQuil, Sudafed cold and flu.  She has not been using her albuterol  inhaler.  Symptoms are better with taking a warm bath and worse with lying down, bending forward.  She has a past medical history of asthma, GERD depression/anxiety.  LMP: Months ago.  She is on continuous OCPs, but would like for us  to check a urine pregnancy.  PCP: [facility].  Past Medical History:  Diagnosis Date   Allergy    Asthma    GERD (gastroesophageal reflux disease)    Hyperemesis 11/14/2018   UTI (urinary tract infection)     Past Surgical History:  Procedure Laterality Date   BREAST BIOPSY Right 2019   benign fibroadenoma and currently asymptomatic   TONSILLECTOMY AND ADENOIDECTOMY  2012   WISDOM TOOTH EXTRACTION Bilateral 2011    Family History  Problem Relation Age of Onset   Crohn's disease Mother    Asthma Mother    Depression Mother    Early death Mother    Anxiety disorder Mother    Arthritis Father    Depression Father    Anxiety disorder Father    Glaucoma Father    Asthma Brother    Heart disease Maternal Grandfather    Arthritis Paternal Grandmother    Depression Paternal Grandmother    Mental retardation Paternal Grandmother    Heart disease Paternal Grandfather         Additionally heart disease in great-grandfasther and great-great-grandfather   Arthritis Paternal Grandfather     Social History[1]  Current Medications[2]  Allergies[3]   ROS  As noted in HPI.   Physical Exam  BP 99/68 (BP Location: Left Arm)   Pulse 96   Temp 98.3 F (36.8 C) (Oral)   Resp 17   Wt 53.5 kg   SpO2 98%   BMI 22.30 kg/m   Constitutional: Well developed, well nourished, no acute distress Eyes: PERRL, EOMI, conjunctiva normal bilaterally HENT: Normocephalic, atraumatic,mucus membranes moist.  Exquisite maxillary, frontal sinus tenderness.  Normal turbinates.  Clear nasal congestion.  Positive cobblestoning.  No postnasal drip. Neck: No cervical lymphadenopathy Respiratory: Clear to auscultation bilaterally, no rales, no wheezing, no rhonchi.  Positive lateral chest wall tenderness Cardiovascular: Normal rate and rhythm, no murmurs, no gallops, no rubs GI: nondistended skin: No rash, skin intact Musculoskeletal: no deformities Neurologic: Alert & oriented x 3, CN III-XII grossly intact, no motor deficits, sensation grossly intact Psychiatric: Speech and behavior appropriate   ED Course   Medications - No data to display  Orders Placed This Encounter  Procedures   POC SARS Coronavirus Ag    Standing Status:   Standing    Number of Occurrences:   1   POCT urine pregnancy    Standing Status:  Standing    Number of Occurrences:   1   Results for orders placed or performed during the hospital encounter of 01/26/24 (from the past 24 hours)  POC SARS Coronavirus Ag     Status: Normal   Collection Time: 01/26/24 11:22 AM  Result Value Ref Range   SARS Coronavirus 2 Ag Negative Negative  POCT urine pregnancy     Status: Normal   Collection Time: 01/26/24 11:22 AM  Result Value Ref Range   Preg Test, Ur Negative Negative   No results found.  ED Clinical Impression  1. Acute upper respiratory infection   2. Mild intermittent asthma with  exacerbation   3. Viral sinusitis      ED Assessment/Plan     Presentation consistent with upper respiratory infection, most likely influenza.  Will check a COVID, urine pregnancy.  Will contact patient at [phone] if either one of them come back abnormal.  Unfortunately, she is out of treatment window for Tamiflu, thus deferring flu testing.  I am also concerned that she may be having an early asthma exacerbation.  Home with an albuterol  inhaler, does not need a prescription for this, and a spacer 2 puffs every 4-6 hours.  Prednisone  40 mg for 5 days, Tylenol  combined with ibuprofen , saline nasal irrigation, Mucinex D, Promethazine  DM.  Discussed with her that she has no clear indications for antibiotics for sinusitis at this time.  Discussed indications for antibiotics and reasons to return.  She is amenable to this plan.  Work note for several days.  COVID, urine pregnancy negative.  Discussed labs, MDM, treatment plan, and plan for follow-up with patient.  patient agrees with plan.   Meds ordered this encounter  Medications   fluticasone  (FLONASE ) 50 MCG/ACT nasal spray    Sig: Place 2 sprays into both nostrils daily.    Dispense:  16 g    Refill:  0   ibuprofen  (ADVIL ) 600 MG tablet    Sig: Take 1 tablet (600 mg total) by mouth every 6 (six) hours as needed.    Dispense:  30 tablet    Refill:  0   predniSONE  (DELTASONE ) 20 MG tablet    Sig: Take 2 tablets (40 mg total) by mouth daily with breakfast for 5 days.    Dispense:  10 tablet    Refill:  0   promethazine -dextromethorphan (PROMETHAZINE -DM) 6.25-15 MG/5ML syrup    Sig: Take 5 mLs by mouth 4 (four) times daily as needed for cough.    Dispense:  118 mL    Refill:  0   Spacer/Aero-Holding Chambers (AEROCHAMBER MV) inhaler    Sig: Use as instructed    Dispense:  1 each    Refill:  1      *This clinic note was created using Dragon dictation software. Therefore, there may be occasional mistakes despite careful  proofreading. ?     [1]  Social History Tobacco Use   Smoking status: Never    Passive exposure: Never   Smokeless tobacco: Never  Vaping Use   Vaping status: Never Used  Substance Use Topics   Alcohol use: Yes    Comment: occ   Drug use: Never  [2] No current facility-administered medications for this encounter.  Current Outpatient Medications:    drospirenone -ethinyl estradiol  (YAZ) 3-0.02 MG tablet, Take 1 tablet by mouth daily., Disp: 84 tablet, Rfl: 3   DULoxetine  (CYMBALTA ) 30 MG capsule, TAKE 3 CAPSULES BY MOUTH EVERY DAY, Disp: 270 capsule, Rfl: 0  DULoxetine  (CYMBALTA ) 60 MG capsule, TAKE 1 CAPSULE BY MOUTH EVERY DAY, Disp: 90 capsule, Rfl: 1   fluticasone  (FLONASE ) 50 MCG/ACT nasal spray, Place 2 sprays into both nostrils daily., Disp: 16 g, Rfl: 0   ibuprofen  (ADVIL ) 600 MG tablet, Take 1 tablet (600 mg total) by mouth every 6 (six) hours as needed., Disp: 30 tablet, Rfl: 0   predniSONE  (DELTASONE ) 20 MG tablet, Take 2 tablets (40 mg total) by mouth daily with breakfast for 5 days., Disp: 10 tablet, Rfl: 0   promethazine -dextromethorphan (PROMETHAZINE -DM) 6.25-15 MG/5ML syrup, Take 5 mLs by mouth 4 (four) times daily as needed for cough., Disp: 118 mL, Rfl: 0   Spacer/Aero-Holding Chambers (AEROCHAMBER MV) inhaler, Use as instructed, Disp: 1 each, Rfl: 1   albuterol  (VENTOLIN  HFA) 108 (90 Base) MCG/ACT inhaler, Inhale 1-2 puffs into the lungs every 6 (six) hours as needed for wheezing or shortness of breath., Disp: 6.7 g, Rfl: 2 [3]  Allergies Allergen Reactions   Reglan  [Metoclopramide ] Other (See Comments)    Hot flash, unpleasant feeling      Van Knee, MD 01/26/24 1123  "

## 2024-01-26 NOTE — Discharge Instructions (Signed)
 2 puffs from your albuterol  inhaler using your spacer every 4-6 hours as needed. Make sure you drink extra fluids.   Start Mucinex-D to keep the mucous thin and to decongest you.   You may take 600 mg of motrin  with 1000 mg of tylenol  up to 3-4 times a day as needed for pain. This is an effective combination for pain.  Most sinus infections are viral and do not need antibiotics unless you have a high fever, have had this for 10 days, or you get better and then get sick again. Use a NeilMed sinus rinse with distilled water as often as you want to to reduce nasal congestion. Follow the directions on the box.  Flonase .  If the spacer is too expensive at the pharmacy, you can get an AeroChamber Z-Stat off of [employer] for about $10-$15.  Go to www.goodrx.com to look up your medications. This will give you a list of where you can find your prescriptions at the most affordable prices. Or you can ask the pharmacist what the cash price is. This is frequently cheaper than going through insurance.

## 2024-09-03 ENCOUNTER — Encounter: Admitting: Family Medicine
